# Patient Record
Sex: Female | Born: 1965 | Race: White | Hispanic: No | Marital: Married | Smoking: Current every day smoker
Health system: Southern US, Community
[De-identification: ages and names within clinical notes are randomized; demographics above are authoritative.]

## PROBLEM LIST (undated history)

## (undated) ENCOUNTER — Ambulatory Visit: Admission: RE | Discharge status: Absent without leave | Payer: Self-pay | Source: Ambulatory Visit

## (undated) DIAGNOSIS — K589 Irritable bowel syndrome without diarrhea: Secondary | ICD-10-CM

## (undated) DIAGNOSIS — G629 Polyneuropathy, unspecified: Secondary | ICD-10-CM

## (undated) DIAGNOSIS — F329 Major depressive disorder, single episode, unspecified: Secondary | ICD-10-CM

## (undated) DIAGNOSIS — E1142 Type 2 diabetes mellitus with diabetic polyneuropathy: Secondary | ICD-10-CM

## (undated) DIAGNOSIS — M5416 Radiculopathy, lumbar region: Secondary | ICD-10-CM

## (undated) DIAGNOSIS — S32301A Unspecified fracture of right ilium, initial encounter for closed fracture: Secondary | ICD-10-CM

## (undated) DIAGNOSIS — F32A Depression, unspecified: Secondary | ICD-10-CM

## (undated) DIAGNOSIS — F419 Anxiety disorder, unspecified: Secondary | ICD-10-CM

## (undated) DIAGNOSIS — K529 Noninfective gastroenteritis and colitis, unspecified: Secondary | ICD-10-CM

## (undated) DIAGNOSIS — L97529 Non-pressure chronic ulcer of other part of left foot with unspecified severity: Secondary | ICD-10-CM

## (undated) DIAGNOSIS — E119 Type 2 diabetes mellitus without complications: Secondary | ICD-10-CM

## (undated) DIAGNOSIS — K648 Other hemorrhoids: Secondary | ICD-10-CM

## (undated) DIAGNOSIS — R109 Unspecified abdominal pain: Secondary | ICD-10-CM

## (undated) DIAGNOSIS — M722 Plantar fascial fibromatosis: Secondary | ICD-10-CM

## (undated) DIAGNOSIS — Z87828 Personal history of other (healed) physical injury and trauma: Secondary | ICD-10-CM

## (undated) DIAGNOSIS — S2220XA Unspecified fracture of sternum, initial encounter for closed fracture: Secondary | ICD-10-CM

## (undated) DIAGNOSIS — S0300XA Dislocation of jaw, unspecified side, initial encounter: Secondary | ICD-10-CM

## (undated) DIAGNOSIS — D62 Acute posthemorrhagic anemia: Secondary | ICD-10-CM

## (undated) DIAGNOSIS — A599 Trichomoniasis, unspecified: Secondary | ICD-10-CM

## (undated) DIAGNOSIS — K219 Gastro-esophageal reflux disease without esophagitis: Secondary | ICD-10-CM

## (undated) DIAGNOSIS — L97519 Non-pressure chronic ulcer of other part of right foot with unspecified severity: Secondary | ICD-10-CM

## (undated) DIAGNOSIS — I10 Essential (primary) hypertension: Secondary | ICD-10-CM

## (undated) DIAGNOSIS — E785 Hyperlipidemia, unspecified: Secondary | ICD-10-CM

## (undated) HISTORY — DX: Other hemorrhoids: K64.8

## (undated) HISTORY — DX: Trichomoniasis, unspecified: A59.9

## (undated) HISTORY — DX: Unspecified abdominal pain: R10.9

## (undated) HISTORY — PX: TUBAL LIGATION: SHX77

## (undated) HISTORY — DX: Polyneuropathy, unspecified: G62.9

## (undated) HISTORY — DX: Gastro-esophageal reflux disease without esophagitis: K21.9

## (undated) HISTORY — DX: Dislocation of jaw, unspecified side, initial encounter: S03.00XA

## (undated) HISTORY — DX: Major depressive disorder, single episode, unspecified: F32.9

## (undated) HISTORY — DX: Hyperlipidemia, unspecified: E78.5

## (undated) HISTORY — DX: Depression, unspecified: F32.A

## (undated) HISTORY — DX: Non-pressure chronic ulcer of other part of left foot with unspecified severity: L97.529

## (undated) HISTORY — DX: Radiculopathy, lumbar region: M54.16

## (undated) HISTORY — DX: Non-pressure chronic ulcer of other part of right foot with unspecified severity: L97.519

## (undated) HISTORY — PX: OTHER SURGICAL HISTORY: SHX169

## (undated) HISTORY — DX: Noninfective gastroenteritis and colitis, unspecified: K52.9

## (undated) HISTORY — DX: Personal history of other (healed) physical injury and trauma: Z87.828

## (undated) HISTORY — DX: Anxiety disorder, unspecified: F41.9

## (undated) HISTORY — DX: Type 2 diabetes mellitus with diabetic polyneuropathy: E11.42

---

## 1988-08-24 HISTORY — PX: PELVIC EXENTERATION: SHX738

## 2000-01-20 ENCOUNTER — Encounter: Payer: Self-pay | Admitting: Orthopedic Surgery

## 2000-01-20 ENCOUNTER — Encounter: Admission: RE | Admit: 2000-01-20 | Discharge: 2000-01-20 | Payer: Self-pay | Admitting: Orthopedic Surgery

## 2000-01-28 ENCOUNTER — Encounter: Payer: Self-pay | Admitting: Orthopedic Surgery

## 2000-01-28 ENCOUNTER — Encounter: Admission: RE | Admit: 2000-01-28 | Discharge: 2000-01-28 | Payer: Self-pay | Admitting: Orthopedic Surgery

## 2000-02-19 ENCOUNTER — Emergency Department (HOSPITAL_COMMUNITY): Admission: EM | Admit: 2000-02-19 | Discharge: 2000-02-19 | Payer: Self-pay | Admitting: Emergency Medicine

## 2001-07-13 ENCOUNTER — Encounter: Payer: Self-pay | Admitting: Family Medicine

## 2001-07-13 ENCOUNTER — Ambulatory Visit (HOSPITAL_COMMUNITY): Admission: RE | Admit: 2001-07-13 | Discharge: 2001-07-13 | Payer: Self-pay | Admitting: Family Medicine

## 2002-11-02 ENCOUNTER — Emergency Department (HOSPITAL_COMMUNITY): Admission: EM | Admit: 2002-11-02 | Discharge: 2002-11-02 | Payer: Self-pay | Admitting: Emergency Medicine

## 2002-11-02 ENCOUNTER — Encounter: Payer: Self-pay | Admitting: Emergency Medicine

## 2002-11-03 ENCOUNTER — Emergency Department (HOSPITAL_COMMUNITY): Admission: EM | Admit: 2002-11-03 | Discharge: 2002-11-03 | Payer: Self-pay | Admitting: Emergency Medicine

## 2002-11-03 ENCOUNTER — Encounter: Payer: Self-pay | Admitting: Emergency Medicine

## 2004-01-03 ENCOUNTER — Encounter (INDEPENDENT_AMBULATORY_CARE_PROVIDER_SITE_OTHER): Payer: Self-pay | Admitting: *Deleted

## 2004-01-03 ENCOUNTER — Ambulatory Visit (HOSPITAL_COMMUNITY): Admission: RE | Admit: 2004-01-03 | Discharge: 2004-01-03 | Payer: Self-pay | Admitting: Obstetrics and Gynecology

## 2004-01-03 ENCOUNTER — Ambulatory Visit (HOSPITAL_BASED_OUTPATIENT_CLINIC_OR_DEPARTMENT_OTHER): Admission: RE | Admit: 2004-01-03 | Discharge: 2004-01-03 | Payer: Self-pay | Admitting: Obstetrics and Gynecology

## 2005-04-09 ENCOUNTER — Inpatient Hospital Stay (HOSPITAL_COMMUNITY): Admission: RE | Admit: 2005-04-09 | Discharge: 2005-04-12 | Payer: Self-pay | Admitting: Psychiatry

## 2005-04-09 ENCOUNTER — Ambulatory Visit: Payer: Self-pay | Admitting: Psychiatry

## 2005-04-22 ENCOUNTER — Emergency Department (HOSPITAL_COMMUNITY): Admission: EM | Admit: 2005-04-22 | Discharge: 2005-04-23 | Payer: Self-pay | Admitting: Emergency Medicine

## 2005-12-21 ENCOUNTER — Emergency Department (HOSPITAL_COMMUNITY): Admission: EM | Admit: 2005-12-21 | Discharge: 2005-12-22 | Payer: Self-pay | Admitting: Emergency Medicine

## 2007-11-07 ENCOUNTER — Emergency Department: Payer: Self-pay | Admitting: Emergency Medicine

## 2008-04-14 ENCOUNTER — Emergency Department: Payer: Self-pay | Admitting: Emergency Medicine

## 2008-08-22 ENCOUNTER — Emergency Department (HOSPITAL_COMMUNITY): Admission: EM | Admit: 2008-08-22 | Discharge: 2008-08-22 | Payer: Self-pay | Admitting: Family Medicine

## 2008-09-19 LAB — HM PAP SMEAR

## 2009-05-02 ENCOUNTER — Ambulatory Visit: Payer: Self-pay | Admitting: Family Medicine

## 2009-05-02 DIAGNOSIS — F1010 Alcohol abuse, uncomplicated: Secondary | ICD-10-CM | POA: Insufficient documentation

## 2009-05-02 DIAGNOSIS — F17200 Nicotine dependence, unspecified, uncomplicated: Secondary | ICD-10-CM

## 2009-05-02 DIAGNOSIS — I10 Essential (primary) hypertension: Secondary | ICD-10-CM

## 2009-05-02 DIAGNOSIS — F341 Dysthymic disorder: Secondary | ICD-10-CM

## 2009-05-02 DIAGNOSIS — K219 Gastro-esophageal reflux disease without esophagitis: Secondary | ICD-10-CM

## 2009-05-02 DIAGNOSIS — N946 Dysmenorrhea, unspecified: Secondary | ICD-10-CM

## 2009-05-02 DIAGNOSIS — E781 Pure hyperglyceridemia: Secondary | ICD-10-CM | POA: Insufficient documentation

## 2009-05-31 ENCOUNTER — Encounter: Payer: Self-pay | Admitting: Family Medicine

## 2009-05-31 ENCOUNTER — Ambulatory Visit: Payer: Self-pay | Admitting: Family Medicine

## 2009-07-19 ENCOUNTER — Emergency Department (HOSPITAL_COMMUNITY): Admission: EM | Admit: 2009-07-19 | Discharge: 2009-07-19 | Payer: Self-pay | Admitting: Emergency Medicine

## 2009-07-23 ENCOUNTER — Ambulatory Visit: Payer: Self-pay | Admitting: Family Medicine

## 2009-07-30 ENCOUNTER — Telehealth: Payer: Self-pay | Admitting: *Deleted

## 2009-08-01 ENCOUNTER — Telehealth (INDEPENDENT_AMBULATORY_CARE_PROVIDER_SITE_OTHER): Payer: Self-pay | Admitting: *Deleted

## 2009-08-08 ENCOUNTER — Ambulatory Visit: Payer: Self-pay | Admitting: Family Medicine

## 2009-08-08 ENCOUNTER — Encounter: Payer: Self-pay | Admitting: Family Medicine

## 2009-08-08 LAB — CONVERTED CEMR LAB
ALT: 14 units/L (ref 0–35)
BUN: 9 mg/dL (ref 6–23)
Basophils Relative: 0 % (ref 0–1)
CO2: 21 meq/L (ref 19–32)
Cholesterol: 230 mg/dL — ABNORMAL HIGH (ref 0–200)
Creatinine, Ser: 0.73 mg/dL (ref 0.40–1.20)
Eosinophils Absolute: 0.1 10*3/uL (ref 0.0–0.7)
Eosinophils Relative: 1 % (ref 0–5)
Glucose, Bld: 93 mg/dL (ref 70–99)
HDL: 44 mg/dL (ref >39)
MCHC: 33 g/dL (ref 30.0–36.0)
MCV: 99 fL (ref 78.0–100.0)
Monocytes Relative: 8 % (ref 3–12)
Neutrophils Relative %: 62 % (ref 43–77)
RBC: 3.92 M/uL (ref 3.87–5.11)
Total Bilirubin: 0.3 mg/dL (ref 0.3–1.2)
Total CHOL/HDL Ratio: 5.2
Triglycerides: 388 mg/dL — ABNORMAL HIGH (ref <150)
VLDL: 78 mg/dL — ABNORMAL HIGH (ref 0–40)

## 2009-08-20 ENCOUNTER — Encounter: Payer: Self-pay | Admitting: Family Medicine

## 2009-09-04 ENCOUNTER — Ambulatory Visit: Payer: Self-pay | Admitting: Family Medicine

## 2009-09-11 ENCOUNTER — Telehealth: Payer: Self-pay | Admitting: *Deleted

## 2009-10-09 ENCOUNTER — Ambulatory Visit: Payer: Self-pay | Admitting: Family Medicine

## 2009-10-09 DIAGNOSIS — G43909 Migraine, unspecified, not intractable, without status migrainosus: Secondary | ICD-10-CM

## 2009-10-21 ENCOUNTER — Ambulatory Visit: Payer: Self-pay | Admitting: Internal Medicine

## 2009-10-21 DIAGNOSIS — K625 Hemorrhage of anus and rectum: Secondary | ICD-10-CM

## 2009-10-22 ENCOUNTER — Ambulatory Visit: Payer: Self-pay | Admitting: Cardiovascular Disease

## 2009-10-24 ENCOUNTER — Telehealth: Payer: Self-pay | Admitting: Internal Medicine

## 2009-11-05 ENCOUNTER — Telehealth: Payer: Self-pay | Admitting: Family Medicine

## 2009-11-11 ENCOUNTER — Telehealth: Payer: Self-pay | Admitting: Family Medicine

## 2009-11-13 ENCOUNTER — Encounter: Payer: Self-pay | Admitting: Family Medicine

## 2009-11-13 ENCOUNTER — Telehealth: Payer: Self-pay | Admitting: Family Medicine

## 2009-11-13 ENCOUNTER — Ambulatory Visit: Payer: Self-pay | Admitting: Internal Medicine

## 2009-11-13 ENCOUNTER — Ambulatory Visit (HOSPITAL_COMMUNITY): Admission: RE | Admit: 2009-11-13 | Discharge: 2009-11-13 | Payer: Self-pay | Admitting: Internal Medicine

## 2009-11-13 LAB — HM COLONOSCOPY

## 2009-11-25 ENCOUNTER — Ambulatory Visit: Payer: Self-pay | Admitting: Family Medicine

## 2009-11-25 DIAGNOSIS — J309 Allergic rhinitis, unspecified: Secondary | ICD-10-CM | POA: Insufficient documentation

## 2009-12-02 ENCOUNTER — Telehealth: Payer: Self-pay | Admitting: Family Medicine

## 2009-12-24 ENCOUNTER — Telehealth: Payer: Self-pay | Admitting: Family Medicine

## 2010-01-13 ENCOUNTER — Ambulatory Visit: Payer: Self-pay | Admitting: Family Medicine

## 2010-01-30 ENCOUNTER — Encounter: Payer: Self-pay | Admitting: Family Medicine

## 2010-02-06 ENCOUNTER — Ambulatory Visit: Payer: Self-pay | Admitting: Family Medicine

## 2010-02-06 ENCOUNTER — Encounter: Payer: Self-pay | Admitting: Family Medicine

## 2010-02-06 DIAGNOSIS — R609 Edema, unspecified: Secondary | ICD-10-CM | POA: Insufficient documentation

## 2010-02-06 DIAGNOSIS — R252 Cramp and spasm: Secondary | ICD-10-CM | POA: Insufficient documentation

## 2010-02-06 LAB — CONVERTED CEMR LAB
Alkaline Phosphatase: 68 units/L (ref 39–117)
BUN: 13 mg/dL (ref 6–23)
CO2: 24 meq/L (ref 19–32)
Glucose, Bld: 95 mg/dL (ref 70–99)
HCT: 40.8 % (ref 36.0–46.0)
HCV Ab: NEGATIVE
Hemoglobin: 13.1 g/dL (ref 12.0–15.0)
MCHC: 32.1 g/dL (ref 30.0–36.0)
MCV: 101.7 fL — ABNORMAL HIGH (ref 78.0–100.0)
Magnesium: 2.2 mg/dL (ref 1.5–2.5)
Phosphorus: 2.8 mg/dL (ref 2.3–4.6)
RBC: 4.01 M/uL (ref 3.87–5.11)
TSH: 0.68 microintl units/mL (ref 0.350–4.500)
Total Bilirubin: 0.3 mg/dL (ref 0.3–1.2)
WBC: 9.1 10*3/uL (ref 4.0–10.5)

## 2010-02-12 ENCOUNTER — Encounter: Payer: Self-pay | Admitting: Family Medicine

## 2010-02-12 LAB — CONVERTED CEMR LAB: Pap Smear: NORMAL

## 2010-03-21 ENCOUNTER — Encounter: Payer: Self-pay | Admitting: Family Medicine

## 2010-03-21 ENCOUNTER — Ambulatory Visit: Payer: Self-pay | Admitting: Family Medicine

## 2010-03-21 DIAGNOSIS — K589 Irritable bowel syndrome without diarrhea: Secondary | ICD-10-CM

## 2010-03-21 LAB — CONVERTED CEMR LAB
Eosinophils Absolute: 0.1 10*3/uL (ref 0.0–0.7)
Eosinophils Relative: 1 % (ref 0–5)
HCT: 39.3 % (ref 36.0–46.0)
Hemoglobin: 13.2 g/dL (ref 12.0–15.0)
Lymphocytes Relative: 22 % (ref 12–46)
Lymphs Abs: 1.8 10*3/uL (ref 0.7–4.0)
MCV: 98 fL (ref 78.0–100.0)
Monocytes Absolute: 0.5 10*3/uL (ref 0.1–1.0)
Platelets: 282 10*3/uL (ref 150–400)
RDW: 12.1 % (ref 11.5–15.5)
WBC: 8.3 10*3/uL (ref 4.0–10.5)

## 2010-03-25 ENCOUNTER — Encounter: Payer: Self-pay | Admitting: Family Medicine

## 2010-03-28 ENCOUNTER — Telehealth: Payer: Self-pay | Admitting: Family Medicine

## 2010-04-01 ENCOUNTER — Ambulatory Visit: Payer: Self-pay | Admitting: Family Medicine

## 2010-04-04 ENCOUNTER — Telehealth: Payer: Self-pay | Admitting: *Deleted

## 2010-04-29 ENCOUNTER — Ambulatory Visit: Payer: Self-pay | Admitting: Family Medicine

## 2010-04-29 DIAGNOSIS — J069 Acute upper respiratory infection, unspecified: Secondary | ICD-10-CM | POA: Insufficient documentation

## 2010-05-12 ENCOUNTER — Telehealth: Payer: Self-pay | Admitting: Family Medicine

## 2010-05-23 ENCOUNTER — Telehealth (INDEPENDENT_AMBULATORY_CARE_PROVIDER_SITE_OTHER): Payer: Self-pay | Admitting: *Deleted

## 2010-07-01 ENCOUNTER — Ambulatory Visit: Payer: Self-pay | Admitting: Family Medicine

## 2010-07-15 ENCOUNTER — Telehealth: Payer: Self-pay | Admitting: Family Medicine

## 2010-08-05 ENCOUNTER — Encounter: Payer: Self-pay | Admitting: Family Medicine

## 2010-08-05 ENCOUNTER — Ambulatory Visit: Payer: Self-pay | Admitting: Family Medicine

## 2010-08-05 DIAGNOSIS — R1084 Generalized abdominal pain: Secondary | ICD-10-CM | POA: Insufficient documentation

## 2010-08-05 LAB — CONVERTED CEMR LAB
ALT: 22 units/L (ref 0–35)
AST: 21 units/L (ref 0–37)
Albumin: 4.4 g/dL (ref 3.5–5.2)
Alkaline Phosphatase: 71 units/L (ref 39–117)
BUN: 11 mg/dL (ref 6–23)
Basophils Absolute: 0 10*3/uL (ref 0.0–0.1)
Basophils Relative: 0 % (ref 0–1)
CO2: 24 meq/L (ref 19–32)
Calcium: 9.6 mg/dL (ref 8.4–10.5)
Chloride: 106 meq/L (ref 96–112)
Creatinine, Ser: 0.64 mg/dL (ref 0.40–1.20)
Eosinophils Absolute: 0.1 10*3/uL (ref 0.0–0.7)
Eosinophils Relative: 2 % (ref 0–5)
Glucose, Bld: 112 mg/dL — ABNORMAL HIGH (ref 70–99)
HCT: 40 % (ref 36.0–46.0)
Hemoglobin: 13.1 g/dL (ref 12.0–15.0)
Lymphocytes Relative: 21 % (ref 12–46)
Lymphs Abs: 1.8 10*3/uL (ref 0.7–4.0)
MCHC: 32.8 g/dL (ref 30.0–36.0)
MCV: 100.5 fL — ABNORMAL HIGH (ref 78.0–100.0)
Monocytes Absolute: 0.4 10*3/uL (ref 0.1–1.0)
Monocytes Relative: 5 % (ref 3–12)
Neutro Abs: 6.3 10*3/uL (ref 1.7–7.7)
Neutrophils Relative %: 73 % (ref 43–77)
Platelets: 324 10*3/uL (ref 150–400)
Potassium: 4.6 meq/L (ref 3.5–5.3)
RBC: 3.98 M/uL (ref 3.87–5.11)
RDW: 12.2 % (ref 11.5–15.5)
Sodium: 139 meq/L (ref 135–145)
Total Bilirubin: 0.3 mg/dL (ref 0.3–1.2)
Total Protein: 7.4 g/dL (ref 6.0–8.3)
WBC: 8.6 10*3/uL (ref 4.0–10.5)

## 2010-08-06 ENCOUNTER — Ambulatory Visit: Payer: Self-pay | Admitting: Family Medicine

## 2010-09-25 NOTE — Miscellaneous (Signed)
Summary: patient summary  Clinical Lists Changes  Problems: Removed problem of INSOMNIA, TRANSIENT (ICD-780.52) Removed problem of DYSPNEA (ICD-786.05) Changed problem from RECTAL BLEEDING (ICD-569.3) to History of  RECTAL BLEEDING (ICD-569.3) Removed problem of ABDOMINAL PAIN -GENERALIZED (ICD-789.07) Removed problem of LEG EDEMA (ICD-782.3) -  and hand edema Observations: Added new observation of SOCIAL HX: has 4 brothers (3 living) and 1 sister.  lives with Uvaldo Rising (her ex husband but they are back together).  1 son Barbara Cower - born in [birth year]). + alcohol, + tobacco, no drugs. looking for work.  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 originally from [place].  Alcohol Use - yes Daily Caffeine Use  (01/30/2010 16:11) Added new observation of PAST MED HX: history of cervical cancer  at age 19 - "frozen off".  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  lives with Uvaldo Rising (her ex husband but they are back together).  1 son Barbara Cower - born in [birth year]). + alcohol, + tobacco, no drugs. looking for work.  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 originally from [place].  Alcohol Use - yes Daily Caffeine Use

## 2010-09-25 NOTE — Progress Notes (Signed)
Summary: cxl  Phone Note Call from Patient Call back at Home Phone [phone]   Caller: Patient Summary of Call: cannot come today due to having flu like symptoms Initial call taken by: De Nurse,  December 02, 2009 8:44 AM

## 2010-09-25 NOTE — Progress Notes (Signed)
Summary: Rx Req  Phone Note Refill Request Call back at Home Phone [phone] Message from:  Patient  Refills Requested: Medication #1:  KLONOPIN 1 MG TABS 1 by mouth two times a day as needed anxiety. Do not fill until 07/31/2009 Please call when ready to pick up.  Initial call taken by: Clydell Hakim,  November 05, 2009 12:05 PM  Follow-up for Phone Call        at front desk Follow-up by: Ancil Boozer  MD,  November 08, 2009 9:47 AM    New/Updated Medications: KLONOPIN 1 MG TABS (CLONAZEPAM) 1 by mouth two times a day as needed anxiety. Do not fill until 11/06/2009.  last fill before visit with MD Prescriptions: KLONOPIN 1 MG TABS (CLONAZEPAM) 1 by mouth two times a day as needed anxiety. Do not fill until 11/06/2009.  last fill before visit with MD  #60 x 0   Entered and Authorized by:   Ancil Boozer  MD   Signed by:   Ancil Boozer  MD on 11/08/2009   Method used:   Handwritten   RxID:   [id number]

## 2010-09-25 NOTE — Letter (Signed)
Summary: Generic Letter  [facility]  [address]   [place], [place] [address]   Phone: [phone]  Fax: [phone]    02/12/2010  NOVIS LEAGUE [address] [place], [place]  [address]  Dear Ms. BARBOUR-SWAIN,   Your recent pap smear was normal.  Your next is due in 1 year.  Your other lab tests were also all normal.  If you have questions feel free to call the family practice center.     Sincerely,   Ancil Boozer  MD  Appended Document: Generic Letter mailed

## 2010-09-25 NOTE — Assessment & Plan Note (Signed)
Summary: f/u,df(resch'd from 1/7/meds refill pt of alm)bmc   Vital Signs:  Patient profile:   45 year old female Height:      68.25 inches Weight:      171 pounds BMI:     25.90 BSA:     1.92 Temp:     97.9 degrees F Pulse rate:   108 / minute BP sitting:   133 / 91  Vitals Entered By: Jone Baseman CMA (September 04, 2009 9:11 AM) CC: MED REFILLS Is Patient Diabetic? No Pain Assessment Patient in pain? yes     Location: STOMACH Intensity: 7   Primary Care Provider:  Ancil Boozer  MD  CC:  MED REFILLS.  History of Present Illness: 1. Abd pain -  Has been going on since November. Pain is in mid-lower abdomen and intermittent. Not worse with movement or food. Not related to menses. Seen in ED November and CT scan showed splenic flexure diverticulitis. Given a course of cipro and flagyl which helped slightly but she hasn't fully recovered. Started on percocet in Nov by Dr. Sandi Mealy -- taking 2 to 4 of these per day which help. Hasn't obtained the stool studies that Dr. Sandi Mealy ordered. Referred to GI but has had trouble getting in with [facility] (has [facility]). Stools variably well-formed and loose. Pain not consistently related to nausea (see below) No blood in stool. CBC, CMP normal at last visit. Not taking tramadol as this doesn't help her pain.  2. Nausea/vomiting Has persistent burning "acid" in her stomach with associated nausea. Will occasionally force herself to vomit to improve the pain. Sometimes worse with eating, sometimes not associated with eating. Ranitidine doesn't seem to help. Phenergan very helpful for the nausea. Hasn't tried a PPI that she knows of. Milk, tums, pepto-bismol help the nausea.  no bloody emesis   3. Anxiety Stable. Doing well on klonipin.  ROS: no fevers/chills. No joint aches or rash. No problems breathing. + decreased appetite. + abdominal bloating.   Habits & Providers  Alcohol-Tobacco-Diet     Tobacco Status: current     Tobacco Counseling:  to quit use of tobacco products     Cigarette Packs/Day: 1.0  Current Medications (verified): 1)  Klonopin 1 Mg Tabs (Clonazepam) .Marland Kitchen.. 1 By Mouth Two Times A Day As Needed Anxiety 2)  Hydrocodone-Acetaminophen 5-325 Mg Tabs (Hydrocodone-Acetaminophen) .Marland Kitchen.. 1-2 Tablets By Mouth Every 6 Hours As Needed For Pain 3)  Deep Sea Nasal Spray 0.65 % Soln (Saline) 4)  Prilosec 40 Mg Cpdr (Omeprazole) .... One By Mouth Daily For Reflux 5)  Klonopin 1 Mg Tabs (Clonazepam) .Marland Kitchen.. 1 By Mouth Two Times A Day As Needed Anxiety.  Do Not Fill Until 07/01/2009 6)  Klonopin 1 Mg Tabs (Clonazepam) .Marland Kitchen.. 1 By Mouth Two Times A Day As Needed Anxiety. Do Not Fill Until 07/31/2009 7)  Promethazine Hcl 25 Mg Tabs (Promethazine Hcl) .... 1/2 - 1 By Mouth Three Times A Day As Needed Nausea.  Will Cause Drowsiness  Allergies (verified): 1)  Erythrocin Stearate (Erythromycin Stearate)  Past History:  Past Medical History: history of cervical cancer  at age 49 - frozen off.  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  Physical Exam  Additional Exam:  General:  Vital signs reviewed -- obese but otherwise normal Alert, appropriate; well-dressed and well-nourished Lungs:  work of breathing unlabored, clear to auscultation bilaterally; no wheezes, rales, or ronchi; good air  movement throughout Heart:  regular rate and rhythm, no murmurs; normal s1/s2 Abd: +BS, soft, periumbilical TTP with voluntary guarding; no rebound. No ascites. No distension. No mass.  Pulses:  DP and radial pulses 2+ bilaterally  Extremities:  no cyanosis, clubbing, or edema Neurologic:  alert and oriented. speech normal.    Impression & Recommendations:  Problem # 1:  ABDOMINAL PAIN, PERIUMBILIC (ICD-789.05)  Colitis diagnosed in Nov. [facility] evaluation.  Discussed that percocet was not the best medicine for this kind of pain. Will  try hydrocodone instead to see how this does. Will refer again to GI -- hopefully [facility] will be able to help [place]. Tramadol removed from med list. Consider anothe course of abx with probiotics.   Orders: [facility]- Est  Level 4 ([address])  Problem # 2:  GASTROESOPHAGEAL REFLUX DISEASE (ICD-530.81) Advance to PPI. Continue phenergain.  Her updated medication list for this problem includes:    Prilosec 40 Mg Cpdr (Omeprazole) ..... One by mouth daily for reflux  Orders: Gastroenterology Referral (GI) [facility]- Est  Level 4 ([address])  Problem # 3:  ANXIETY DEPRESSION (ICD-300.4)  refill klonipin.  Orders: [facility]- Est  Level 4 ([address])  Complete Medication List: 1)  Klonopin 1 Mg Tabs (Clonazepam) .Marland Kitchen.. 1 by mouth two times a day as needed anxiety 2)  Hydrocodone-acetaminophen 5-325 Mg Tabs (Hydrocodone-acetaminophen) .Marland Kitchen.. 1-2 tablets by mouth every 6 hours as needed for pain 3)  Deep Sea Nasal Spray 0.65 % Soln (Saline) 4)  Prilosec 40 Mg Cpdr (Omeprazole) .... One by mouth daily for reflux 5)  Klonopin 1 Mg Tabs (Clonazepam) .Marland Kitchen.. 1 by mouth two times a day as needed anxiety.  do not fill until 07/01/2009 6)  Klonopin 1 Mg Tabs (Clonazepam) .Marland Kitchen.. 1 by mouth two times a day as needed anxiety. do not fill until 07/31/2009 7)  Promethazine Hcl 25 Mg Tabs (Promethazine hcl) .... 1/2 - 1 by mouth three times a day as needed nausea.  will cause drowsiness  Patient Instructions: 1)  Start the prilosec to replace the ranitidine. 2)  Please bring in the stool studies. 3)  stop the tramadol. 4)  We'll work to get you an appointment with [facility].  5)  Try the vicodin for the pain. Let me know how this does for you. 6)  follow-up in one month. Prescriptions: PROMETHAZINE HCL 25 MG TABS (PROMETHAZINE HCL) 1/2 - 1 by mouth three times a day as needed nausea.  will cause drowsiness  #30 x 1   Entered and Authorized by:   Myrtie Soman  MD   Signed by:   Myrtie Soman  MD on 09/04/2009   Method used:   Print  then Give to Patient   RxID:   [id number] KLONOPIN 1 MG TABS (CLONAZEPAM) 1 by mouth two times a day as needed anxiety  #60 x 0   Entered and Authorized by:   Myrtie Soman  MD   Signed by:   Myrtie Soman  MD on 09/04/2009   Method used:   Print then Give to Patient   RxID:   [id number] HYDROCODONE-ACETAMINOPHEN 5-325 MG TABS (HYDROCODONE-ACETAMINOPHEN) 1-2 tablets by mouth every 6 hours as needed for pain  #60 x 0   Entered and Authorized by:   Myrtie Soman  MD   Signed by:   Myrtie Soman  MD on 09/04/2009   Method used:   Print then Give to Patient   RxID:   [id number] PRILOSEC 40 MG CPDR (OMEPRAZOLE) one by mouth daily  for reflux  #30 x 2   Entered and Authorized by:   Myrtie Soman  MD   Signed by:   Myrtie Soman  MD on 09/04/2009   Method used:   Electronically to        [facility]* (retail)       [address]       [place], [place]  [address]       Ph: [phone]       Fax: [phone]   RxID:   [id number]

## 2010-09-25 NOTE — Assessment & Plan Note (Signed)
Summary: f/u meds,df   Vital Signs:  Patient profile:   45 year old female Height:      68.5 inches Weight:      175.2 pounds BMI:     26.35 Temp:     98.0 degrees F oral Pulse rate:   103 / minute BP sitting:   148 / 107  (right arm) Cuff size:   regular  Vitals Entered By: Garen Grams LPN (November 25, 452 2:10 PM)  Serial Vital Signs/Assessments:  Time      Position  BP       Pulse  Resp  Temp     By                     140/86                         Ancil Boozer  MD  CC: f/u blood pressure, abdominal pain, ? allergies, extremities swelling Is Patient Diabetic? No   Primary Care Provider:  Ancil Boozer  MD  CC:  f/u blood pressure, abdominal pain, ? allergies, and extremities swelling.  History of Present Illness: BP: hasn't been checking regularly but has noticed it is up some at her other doctors visits.  has been trying to watch the amount of salt in her diet however does notice that randomly her feet and hands will swell.  denies chest pains  abdominal pain: continues partiucularly with menses.  has been using excedrin migrain for headaches, goodys and bc powders for menstral pains.  has had some improvement with zantac 150 two times a day and phenergan prn.  wonders if this could be the result of a "female issue."  wonders if a hysterectomy would help.  is due for pap and wants to schedule this.   still having diarrhea without blood and denies having vomiting any more.  ? allergies: for past few weeks has noticed productive cough, runny nose and sneezing.  denies itchy eyes.  OTC loratadine has helped some but she often forgets to take it.  denies fevers  swelling: occurs randomly.  worst in feet at night, worst in hands when wakes up in AM.  no significant pain with the swelling. no erythema or numbness with swelling  Habits & Providers  Alcohol-Tobacco-Diet     Tobacco Status: current     Tobacco Counseling: to quit use of tobacco products  Current Medications  (verified): 1)  Zantac 150 Mg Tabs (Ranitidine Hcl) .... Two Times A Day For Reflux 2)  Klonopin 1 Mg Tabs (Clonazepam) .Marland Kitchen.. 1 By Mouth Two Times A Day As Needed Anxiety. Do Not Fill Until 11/06/2009.  Last Fill Before Visit With Md 3)  Promethazine Hcl 25 Mg Tabs (Promethazine Hcl) .... 1/2 - 1 By Mouth Every 4-6 Hours.  Will Cause Drowsiness 4)  Hydrochlorothiazide 12.5 Mg Caps (Hydrochlorothiazide) .Marland Kitchen.. 1 By Mouth Once Daily For Blood Pressure 5)  Fluticasone Propionate 50 Mcg/act Susp (Fluticasone Propionate) .... 2 Sprays Each Nostril Daily 6)  Hydrocodone-Acetaminophen 5-500 Mg Tabs (Hydrocodone-Acetaminophen) .Marland Kitchen.. 1 By Mouth Two Times A Day As Needed Pain On Menstrual Cycle  Allergies (verified): 1)  Erythrocin Stearate  Past History:  Past medical, surgical, family and social histories (including risk factors) reviewed for relevance to current acute and chronic problems.  Past Medical History: Reviewed history from 10/21/2009 and no changes required. history of cervical cancer  at age 63 - frozen off.  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 C section x 1 (1988) BTL  Family History: Reviewed history from 05/02/2009 and no changes required. Mom - heart disease, diabetes, dementia, emphysema (deceased) Dad - massive MI at age 29, heavy drinker (deceased) Brother - 3 strokes  Brother - deceased ([place] in [place] - emphysema) 2 brothers - heavy drinkers Sister - bipolar, ADHD  Social History: Reviewed history from 10/21/2009 and no changes required. has 4 brothers (3 living) and 1 sister.  lives with Uvaldo Rising.  1 son Barbara Cower - born in 22). + alcohol, + tobacco, no drugs. looking for work.  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 originally from [place].  Alcohol Use - yes Daily Caffeine Use  Review of Systems       per HPI  Physical Exam  General:  Well-developed,well-nourished,in no acute distress; alert,appropriate and cooperative throughout examination.  VS noted Lungs:  Normal respiratory effort, chest expands symmetrically. Lungs are clear to auscultation, no crackles or wheezes. Heart:  Normal rate and regular rhythm. S1 and S2 normal without gallop, murmur, click, rub or other extra sounds. Abdomen:  Bowel sounds positive,abdomen soft and non-tender without masses, organomegaly or hernias noted. Extremities:  no edema noted today   Impression & Recommendations:  Problem # 1:  ESSENTIAL HYPERTENSION, BENIGN (ICD-401.1) Assessment New  start HCTZ.  when comes back for pap check Cmet.   Her updated medication list for this problem includes:    Hydrochlorothiazide 12.5 Mg Caps (Hydrochlorothiazide) .Marland Kitchen... 1 by mouth once daily for blood pressure  Orders: [facility]- Est  Level 4 ([address])  Problem # 2:  ABDOMINAL PAIN -GENERALIZED (ICD-789.07) Assessment: Unchanged  likely multifactorial.  due for pelvic workup - have asked her to schedule pap and can discuss female issues in further depth at that time.  would like to decrease phenergan, stop vicodin for menstrual pains.   Orders: [facility]- Est  Level 4 ([address])  Problem # 3:  ALLERGIC RHINITIS (ICD-477.9) Assessment: New  trial of fluticasone continue otc loratadine  The following medications were removed from the medication list:    Deep Sea Nasal Spray 0.65 % Soln (Saline) Her updated medication list for this problem includes:    Promethazine Hcl 25 Mg Tabs (Promethazine hcl) .Marland Kitchen... 1/2 - 1 by mouth every 4-6 hours.  will cause drowsiness    Fluticasone Propionate 50 Mcg/act Susp (Fluticasone propionate) .Marland Kitchen... 2 sprays each nostril daily  Orders: [facility]- Est  Level 4 ([address])  Problem # 4:  LEG EDEMA (ICD-782.3) Assessment:  New  unclear etiology at this point. suspect at least part of it is too much salt intake. another part may be hormonal. monitor for improvement with hctz.   Her updated medication list for this problem includes:    Hydrochlorothiazide 12.5 Mg Caps (Hydrochlorothiazide) .Marland Kitchen... 1 by mouth once daily for blood pressure  Orders: [facility]- Est  Level 4 ([address])  Complete Medication List: 1)  Zantac 150 Mg Tabs (Ranitidine hcl) .... Two times a day for reflux 2)  Klonopin 1 Mg Tabs (Clonazepam) .Marland Kitchen.. 1 by mouth two times a day as needed anxiety. do not fill until 11/06/2009.  last fill before visit with md 3)  Promethazine Hcl 25 Mg Tabs (Promethazine hcl) .... 1/2 - 1 by mouth every 4-6 hours.  will cause drowsiness 4)  Hydrochlorothiazide 12.5  Mg Caps (Hydrochlorothiazide) .Marland Kitchen.. 1 by mouth once daily for blood pressure 5)  Fluticasone Propionate 50 Mcg/act Susp (Fluticasone propionate) .... 2 sprays each nostril daily 6)  Hydrocodone-acetaminophen 5-500 Mg Tabs (Hydrocodone-acetaminophen) .Marland Kitchen.. 1 by mouth two times a day as needed pain on menstrual cycle  Patient Instructions: 1)  Please schedule an appointment for your pap smear.  2)  Take your medication only as prescribed.  Prescriptions: HYDROCHLOROTHIAZIDE 12.5 MG CAPS (HYDROCHLOROTHIAZIDE) 1 by mouth once daily for blood pressure  #30 x 6   Entered and Authorized by:   Ancil Boozer  MD   Signed by:   Ancil Boozer  MD on 11/25/2009   Method used:   Handwritten   RxID:   [id number] FLUTICASONE PROPIONATE 50 MCG/ACT SUSP (FLUTICASONE PROPIONATE) 2 sprays each nostril daily  #1 x 6   Entered and Authorized by:   Ancil Boozer  MD   Signed by:   Ancil Boozer  MD on 11/25/2009   Method used:   Handwritten   RxID:   [id number] KLONOPIN 1 MG TABS (CLONAZEPAM) 1 by mouth two times a day as needed anxiety. Do not fill until 11/06/2009.  last fill before visit with MD  #60 x 0   Entered and Authorized by:   Ancil Boozer  MD   Signed by:    Ancil Boozer  MD on 11/25/2009   Method used:   Handwritten   RxID:   [id number] HYDROCODONE-ACETAMINOPHEN 5-500 MG TABS (HYDROCODONE-ACETAMINOPHEN) 1 by mouth two times a day as needed pain on menstrual cycle  #14 x 0   Entered and Authorized by:   Ancil Boozer  MD   Signed by:   Ancil Boozer  MD on 11/25/2009   Method used:   Handwritten   RxID:   [id number]   Prevention & Chronic Care Immunizations   Influenza vaccine: Not documented    Tetanus booster: Not documented    Pneumococcal vaccine: Not documented  Other Screening   Pap smear: Not documented    Mammogram: Not documented   Smoking status: current  (11/25/2009)  Lipids   Total Cholesterol: 230  (08/08/2009)   LDL: 108  (08/08/2009)   LDL Direct: Not documented   HDL: 44  (08/08/2009)   Triglycerides: 388  (08/08/2009)    SGOT (AST): 17  (08/08/2009)   SGPT (ALT): 14  (08/08/2009)   Alkaline phosphatase: 62  (08/08/2009)   Total bilirubin: 0.3  (08/08/2009)    Lipid flowsheet reviewed?: Yes   Progress toward LDL goal: At goal  Hypertension   Last Blood Pressure: 148 / 107  (11/25/2009)   Serum creatinine: 0.73  (08/08/2009)   Serum potassium 4.8  (08/08/2009)    Hypertension flowsheet reviewed?: Yes   Progress toward BP goal: Unchanged   Hypertension comments: new dx today  Self-Management Support :   Personal Goals (by the next clinic visit) :      Personal blood pressure goal: 140/90  (11/25/2009)     Personal LDL goal: 130  (11/25/2009)    Hypertension self-management support: Not documented    Hypertension self-management support not done because: Good outcomes  (11/25/2009)    Lipid self-management support: Not documented     Lipid self-management support not done because: Good outcomes  (11/25/2009)    Self-management comments: good outcomes because willing to start med today for blood pressure

## 2010-09-25 NOTE — Procedures (Signed)
Summary: Colonoscopy  Patient: [name] Note: All result statuses are Final unless otherwise noted.  Tests: (1) Colonoscopy (COL)   COL Colonoscopy           DONE     [facility]     [address] [place], [place]  [address]           COLONOSCOPY PROCEDURE REPORT           PATIENT:  Denise Webb, Denise Webb  MR#:  [id number]     BIRTHDATE:  07-Jun-1966, 43 yrs. old  GENDER:  female           ENDOSCOPIST:  Wilhemina Bonito. Eda Keys, MD     Referred by:  [facility] (DR ALM, DR HENSEL),           PROCEDURE DATE:  11/13/2009     PROCEDURE:  Colonoscopy, Diagnostic     ASA CLASS:  Class II     INDICATIONS:  abdominal pain, diarrhea, rectal bleeding; abnormal     CT of abdomen (NOV 2011) - but normal CT 10-22-09           MEDICATIONS:   Fentanyl 150 mcg IV, Versed 15 mg IV, Benadryl 50     mg IV           DESCRIPTION OF PROCEDURE:   After the risks benefits and     alternatives of the procedure were thoroughly explained, informed     consent was obtained.  Digital rectal exam was performed and     revealed no abnormalities.   The Pentax Colonoscope [id number]     endoscope was introduced through the anus and advanced to the     cecum, which was identified by both the appendix and ileocecal     valve, without limitations.  The quality of the prep was     excellent, using MoviPrep. THE PATIENT WAS DIFFICULT TO ADEQUATELY     SEDATE. The instrument was then slowly withdrawn as the colon was     fully examined. PHOTOS TO DOCUMENT THE ILEUM, CECAL LANDMARKS, AND     RECTAL RETROFLEXTION WERE TAKEN BUT NOT CAPTURED DUE TO TECHNICAL     DIFFICULTIES.           FINDINGS:  A normal appearing cecum, ileocecal valve, and     appendiceal orifice were identified. The ascending, hepatic     flexure, transverse, splenic flexure, descending, sigmoid colon,     and rectum appeared unremarkable.  The terminal ileum appeared     normal.   Retroflexed views in the rectum  revealed internal     hemorrhoids.    The scope was then withdrawn from the patient and     the procedure completed.           COMPLICATIONS:  None           ENDOSCOPIC IMPRESSION:     1) Normal colon     2) Normal terminal ileum     3) Internal hemorrhoids           RECOMMENDATIONS:     1) NO GI CAUSE FOR PAIN FOUND OR SUSPECTED     2) Return to the care of your primary provider.     2) Continue current colorectal screening recommendations for     "routine risk" patients with a repeat colonoscopy in 10 years.           ______________________________  Wilhemina Bonito. Eda Keys, MD           CC:  [facility] (Dr. Ancil Boozer); The Patient                 n.     eSIGNED:   Wilhemina Bonito. Eda Keys at 11/13/2009 09:33 AM           Burney Gauze, [id number]  Note: An exclamation mark (!) indicates a result that was not dispersed into the flowsheet. Document Creation Date: 11/13/2009 9:33 AM _______________________________________________________________________  (1) Order result status: Final Collection or observation date-time: 11/13/2009 09:21 Requested date-time:  Receipt date-time:  Reported date-time:  Referring Physician:   Ordering Physician: Fransico Setters [phone]) Specimen Source:  Source: Launa Grill Order Number: [id number] Lab site:

## 2010-09-25 NOTE — Progress Notes (Signed)
Summary: refill  Phone Note Refill Request Call back at Home Phone [phone] Message from:  Patient  Refills Requested: Medication #1:  HYDROCHLOROTHIAZIDE 12.5 MG CAPS 1 by mouth once daily for blood pressure  Medication #2:  KLONOPIN 1 MG TABS 1 by mouth two times a day as needed anxiety. Do not fill until 11/06/2009.  last fill before visit with MD  Medication #3:  HYDROCODONE-ACETAMINOPHEN 5-500 MG TABS 1 by mouth two times a day as needed pain on menstrual cycle. Initial call taken by: De Nurse,  Dec 24, 2009 9:28 AM  Follow-up for Phone Call        scripts available at front desk. last fill before appt for workup of female issues Follow-up by: Ancil Boozer  MD,  Dec 25, 2009 8:45 AM  Additional Follow-up for Phone Call Additional follow up Details #1::        pt informed - will make appt when she comes to pick up meds Additional Follow-up by: De Nurse,  Dec 25, 2009 8:46 AM    Prescriptions: HYDROCODONE-ACETAMINOPHEN 5-500 MG TABS (HYDROCODONE-ACETAMINOPHEN) 1 by mouth two times a day as needed pain on menstrual cycle  #14 x 0   Entered and Authorized by:   Ancil Boozer  MD   Signed by:   Ancil Boozer  MD on 12/25/2009   Method used:   Handwritten   RxID:   [id number] HYDROCHLOROTHIAZIDE 12.5 MG CAPS (HYDROCHLOROTHIAZIDE) 1 by mouth once daily for blood pressure  #30 x 1   Entered and Authorized by:   Ancil Boozer  MD   Signed by:   Ancil Boozer  MD on 12/25/2009   Method used:   Handwritten   RxID:   [id number] KLONOPIN 1 MG TABS (CLONAZEPAM) 1 by mouth two times a day as needed anxiety. Do not fill until 11/06/2009.  last fill before visit with MD  #60 x 0   Entered and Authorized by:   Ancil Boozer  MD   Signed by:   Ancil Boozer  MD on 12/25/2009   Method used:   Handwritten   RxID:   [id number]

## 2010-09-25 NOTE — Procedures (Signed)
Summary: Instructions for procedure/[facility] (out pt)  Insturctions for procedure/[facility] (out pt)   Imported By: Sherian Rein 10/23/2009 08:47:07  _____________________________________________________________________  External Attachment:    Type:   Image     Comment:   External Document

## 2010-09-25 NOTE — Progress Notes (Signed)
Summary: Rx Req  Phone Note Call from Patient Call back at Home Phone [phone]   Caller: Patient Summary of Call: Pt asking for a rx for Tramadol.  Pharmacy is [facility]. Initial call taken by: Clydell Hakim,  May 12, 2010 2:16 PM    New/Updated Medications: TRAMADOL HCL 50 MG TABS (TRAMADOL HCL) one tab by mouth two times a day as needed for pain Prescriptions: TRAMADOL HCL 50 MG TABS (TRAMADOL HCL) one tab by mouth two times a day as needed for pain  #15 x 0   Entered and Authorized by:   Bobby Rumpf  MD   Signed by:   Bobby Rumpf  MD on 05/13/2010   Method used:   Electronically to        [facility]* (retail)       [address]       [place], [place]  [address]       Ph: [phone]       Fax: [phone]   RxID:   [id number]

## 2010-09-25 NOTE — Assessment & Plan Note (Signed)
Summary: swollen abd & pain,tcb   Vital Signs:  Patient profile:   45 year old female Height:      68.5 inches Weight:      169.2 pounds BMI:     25.44 Temp:     97.8 degrees F oral Pulse rate:   94 / minute BP sitting:   130 / 87  (left arm) Cuff size:   regular  Vitals Entered By: Garen Grams LPN (April 01, 2010 9:39 AM) CC: having abd pain, diarrhea, nausea Is Patient Diabetic? No   Primary Care Provider:  Bobby Rumpf  MD  CC:  having abd pain, diarrhea, and nausea.  History of Present Illness: CC: abdominal pain, vomiting and diarrhea  1) Chronic abdominal pain: Since June 2010 - diffuse abdominal pain w/ intermittent episodes of diarrhea. Treated for colitis in November 2010 with course of cipro and flagyl. Negative colonoscopy (except for internal hemorrhoids) / EGD in March 2011 w/ Dr. Marina Goodell.  Seen on 7/29 for complaints of diffuse crampy abdominal; pain, diarrhea (6-8 times per day). Also noted to have a single episode of small amount of painless BRBPR (recent colonoscopy did demonstrate internal hemorrhoids). Also with nausea w/o emesis. History of GERD treated with ranitidine (GERD worse with spicy foods, tomatoes).  LMP was this month (unsure when) - history of dysmenorrhea (treated unsuccessfully with Vicodin). Takes Goody powders, tylenol for abdominal pain without relief. Diarrhea somewhat relieved by imodium.   ROS: .  Negative for fever, chills, CP, SOB, constipation, lethargy, sick contact, travel, .  2) Tobacco use: Precontemplative.   See prior meds for med rec   Habits & Providers  Alcohol-Tobacco-Diet     Tobacco Status: current     Tobacco Counseling: to quit use of tobacco products  Medications Prior to Update: 1)  Zantac 150 Mg Tabs (Ranitidine Hcl) .... Two Times A Day For Reflux 2)  Klonopin 1 Mg Tabs (Clonazepam) .Marland Kitchen.. 1 By Mouth Two Times A Day As Needed Anxiety.do Not Do First Fill Until 02/24/10 Then May Fill Every 30 Days 3)  Promethazine Hcl 25  Mg Tabs (Promethazine Hcl) .... 1/2 - 1 By Mouth Every 4-6 Hours.  Will Cause Drowsiness 4)  Hydrochlorothiazide 12.5 Mg Caps (Hydrochlorothiazide) .Marland Kitchen.. 1 By Mouth Once Daily For Blood Pressure 5)  Fluticasone Propionate 50 Mcg/act Susp (Fluticasone Propionate) .... 2 Sprays Each Nostril Daily 6)  Hydrocodone-Acetaminophen 5-500 Mg Tabs (Hydrocodone-Acetaminophen) .Marland Kitchen.. 1 By Mouth Two Times A Day As Needed Pain On Menstrual Cycle.  May Fill Every 30 Days 7)  Lidocaine Viscous 2 % Soln (Lidocaine Hcl) .Marland Kitchen.. 10ml Swish and Spit Every 3 Hrs As Needed.  Disp 7 Day Supply  Allergies (verified): 1)  Erythrocin Stearate  Physical Exam  General:  no acute distress; alert,appropriate and cooperative throughout examination Lungs:  Normal respiratory effort, chest expands symmetrically. Lungs are clear to auscultation, no crackles or wheezes. Heart:  Normal rate and regular rhythm. S1 and S2 normal without gallop, murmur, click, rub or other extra sounds. Abdomen:  minimally distended, mimimally tender diffusely, hyperactive bowel sounds present in all 4 quadrants, no masses, no guarding, and no hepatomegaly.     Impression & Recommendations:  Problem # 1:  ABDOMINAL PAIN (ICD-616.3)  Concern for IBS (diarrhea predominant) given nature of symptoms, negative EGD / colonoscopy. Will start amitryptiline, continue benzodiazepine. Increase fiber. Continue Imodium. Continue to treat GERD. Food diary given,. Follow up in one month.   Orders: [facility]- Est  Level 4 (47829)  Problem #  2:  DYSMENORRHEA (ICD-625.3)  Will discontinue vidocin, start high dose ibuprofen as needed for severe cramping and bleeding with periods. Follow up in one month.   Orders: [facility]- Est  Level 4 ([address])  Problem # 3:  TOBACCO ABUSE (ICD-305.1) Assessment: Unchanged  PRecontemplative. Follow up in one month.   Orders: [facility]- Est  Level 4 (99214)  Complete Medication List: 1)  Zantac 150 Mg Tabs (Ranitidine hcl) .... Two times a  day for reflux 2)  Klonopin 1 Mg Tabs (Clonazepam) .Marland Kitchen.. 1 by mouth two times a day as needed anxiety.do not do first fill until 02/24/10 then may fill every 30 days 3)  Promethazine Hcl 25 Mg Tabs (Promethazine hcl) .... 1/2 - 1 by mouth every 4-6 hours.  will cause drowsiness 4)  Hydrochlorothiazide 12.5 Mg Caps (Hydrochlorothiazide) .Marland Kitchen.. 1 by mouth once daily for blood pressure 5)  Fluticasone Propionate 50 Mcg/act Susp (Fluticasone propionate) .... 2 sprays each nostril daily 6)  Lidocaine Viscous 2 % Soln (Lidocaine hcl) .Marland Kitchen.. 10ml swish and spit every 3 hrs as needed.  disp 7 day supply 7)  Amitriptyline Hcl 50 Mg Tabs (Amitriptyline hcl) .... One tab by mouth at bedtime 8)  Ibuprofen 800 Mg Tabs (Ibuprofen) .... One tab by mouth three times a day as needed severe pain with periods (take for up to 7 days at a time) 9)  Metamucil Smooth Texture 58.6 % Powd (Psyllium) .... Take as directed by mouth once daily. disp one bottle 10)  Imodium A-d 2 Mg Tabs (Loperamide hcl) .... Two tabs by mouth once a day as needed for diarrhea. may repeat 2mg  q4 hrs for up to 16 mg / day total  Patient Instructions: 1)  Follow up in one month. 2)  Eat foods that are high in fiber. (SEE LIST) 3)  Take ibuprofen as directed for pain with periods. 4)  STOP taking other sources of ibuprofen or motrin or Goody powders 5)  START taking Metamucil daily.  6)  START taking Amitryptiline daily.  7)  Stop taking Vicodin for pain with periods - this is NOT an effective medicine for this problem.  Prescriptions: IMODIUM A-D 2 MG TABS (LOPERAMIDE HCL) two tabs by mouth once a day as needed for diarrhea. May repeat 2mg  q4 hrs for up to 16 mg / day total  #60 x 1   Entered and Authorized by:   Bobby Rumpf  MD   Signed by:   Bobby Rumpf  MD on 04/01/2010   Method used:   Electronically to        [facility]* (retail)       [address]       [place], [place]  [address]       Ph: [phone]       Fax: [phone]    RxID:   [id number] METAMUCIL SMOOTH TEXTURE 58.6 % POWD (PSYLLIUM) Take as directed by mouth once daily. Disp one bottle  #1 x 3   Entered and Authorized by:   Bobby Rumpf  MD   Signed by:   Bobby Rumpf  MD on 04/01/2010   Method used:   Electronically to        [facility]* (retail)       [address]       [place], [place]  [address]       Ph: [phone]       Fax: [phone]   RxID:   [id number] IBUPROFEN 800 MG TABS (IBUPROFEN) one tab by mouth  three times a day as needed severe pain with periods (take for up to 7 days at a time)  #30 x 1   Entered and Authorized by:   Bobby Rumpf  MD   Signed by:   Bobby Rumpf  MD on 04/01/2010   Method used:   Electronically to        [facility]* (retail)       [address]       [place], [place]  [address]       Ph: [phone]       Fax: [phone]   RxID:   [id number] AMITRIPTYLINE HCL 50 MG TABS (AMITRIPTYLINE HCL) one tab by mouth at bedtime  #30 x 1   Entered and Authorized by:   Bobby Rumpf  MD   Signed by:   Bobby Rumpf  MD on 04/01/2010   Method used:   Electronically to        [facility]* (retail)       [address]       [place], [place]  [address]       Ph: [phone]       Fax: [phone]   RxID:   [id number]

## 2010-09-25 NOTE — Assessment & Plan Note (Signed)
Summary: F/U PER SMITH/KH   Vital Signs:  Patient profile:   45 year old female Height:      68.5 inches Weight:      175.7 pounds BMI:     26.42 Temp:     98.2 degrees F oral Pulse rate:   80 / minute BP sitting:   123 / 83  (left arm) Cuff size:   regular  Vitals Entered By: Garen Grams LPN (August 06, 2010 11:13 AM) CC: f/u yesterdays visit Is Patient Diabetic? No Pain Assessment Patient in pain? yes     Location: abdomen   Primary Care Provider:  Bobby Rumpf  MD  CC:  f/u yesterdays visit.  History of Present Illness: 1) Abdominal pain: Seen on 12/13 w/ abdominal pain, nausea, diarrhea, possible bright red blood per rectum. Given morphine while in clinic. Started on ciprofloxacin and metronidazole given reported history of diverticulitis. Did not pick up metronidazole yet. Pain and nausea have improved since yesterday.   Last seen by me w/ similar symptoms on 04/01/10, was diagnosed with IBS, started on amitryptiline, continued on Imodium, started on fiber supplementation with significant improvement in symptopms. She reports that she has run out of her amitriptyline months ago, and has not taken her imodium. Patient had an episode of colitis in November 2010, treated with course of cipro and flagyl. Negative colonoscopy (except for internal hemorrhoids) / EGD in March 2011 w/ Dr. Marina Goodell.      Habits & Providers  Alcohol-Tobacco-Diet     Alcohol drinks/day: >5     Alcohol Counseling: to STOP drinking     Alcohol type: all     >5/day in last 3 mos: yes     Feels need to cut down: yes     Feels annoyed by complaints: yes     Feels guilty re: drinking: no     Needs 'eye opener' in am: no     Tobacco Status: current     Tobacco Counseling: to quit use of tobacco products     Cigarette Packs/Day: 0.5  Current Medications (verified): 1)  Klonopin 1 Mg Tabs (Clonazepam) .Marland Kitchen.. 1 By Mouth Two Times A Day As Needed Anxiety. 2)  Promethazine Hcl 25 Mg Tabs (Promethazine  Hcl) .... 1/2 - 1 By Mouth Every 4-6 Hours.  Will Cause Drowsiness 3)  Hydrochlorothiazide 12.5 Mg Caps (Hydrochlorothiazide) .Marland Kitchen.. 1 By Mouth Once Daily For Blood Pressure 4)  Fluticasone Propionate 50 Mcg/act Susp (Fluticasone Propionate) .... 2 Sprays Each Nostril Daily 5)  Amitriptyline Hcl 50 Mg Tabs (Amitriptyline Hcl) .... One Tab By Mouth At Bedtime 6)  Ibuprofen 800 Mg Tabs (Ibuprofen) .... One Tab By Mouth Three Times A Day As Needed Severe Pain With Periods (Take For Up To 7 Days At A Time) 7)  Metamucil Smooth Texture 58.6 % Powd (Psyllium) .... Take As Directed By Mouth Once Daily. Disp One Bottle 8)  Imodium A-D 2 Mg Tabs (Loperamide Hcl) .... Two Tabs By Mouth Once A Day As Needed For Diarrhea. May Repeat 2mg  Q4 Hrs For Up To 16 Mg / Day Total 9)  Tramadol Hcl 50 Mg Tabs (Tramadol Hcl) .... One Tab By Mouth Two Times A Day As Needed For Pain 10)  Ciprofloxacin Hcl 500 Mg Tabs (Ciprofloxacin Hcl) .Marland Kitchen.. 1 Tab By Mouth Two Times A Day For The Next 10 Days 11)  Percocet 7.5-325 Mg Tabs (Oxycodone-Acetaminophen) .Marland Kitchen.. 1 Tab By Mouth Three Times A Day As Needed For Severe Pain For The Next 4  Days  Allergies (verified): 1)  Erythrocin Stearate  Physical Exam  General:  overweight, NAD, laughing  vitals reviewed  Abdomen:  mildly distended, mimimally tender diffusely, normoactive bowel sounds present in all 4 quadrants, no masses, no guarding, and no hepatomegaly., no rebound    Impression & Recommendations:  Problem # 1:  IRRITABLE BOWEL SYNDROME (ICD-564.1) Assessment Deteriorated  Likely diagnosis based on symptomatology. Likely deteriorated in setting of not taking medications as prescribed to prevent flares. No eleveated wbc, no fever, symptoms improving, colonoscopy negative for diverticulosis this year. Will discontinue antibiotics for presumptive diverticulitis for now - consider restart if acutely worsens. Refilled medication, counseled on importance of taking as prescribed.    Follow up in one month to discuss other chronic issues. Avoid triggers that exacerbate episodes. Red flags that would prompt return to care were reviewed with patient and patient expressed understanding.    Orders: [facility]- Est Level  3 (81191)  Complete Medication List: 1)  Klonopin 1 Mg Tabs (Clonazepam) .Marland Kitchen.. 1 by mouth two times a day as needed anxiety. 2)  Promethazine Hcl 25 Mg Tabs (Promethazine hcl) .... 1/2 - 1 by mouth every 4-6 hours.  will cause drowsiness 3)  Hydrochlorothiazide 12.5 Mg Caps (Hydrochlorothiazide) .Marland Kitchen.. 1 by mouth once daily for blood pressure 4)  Fluticasone Propionate 50 Mcg/act Susp (Fluticasone propionate) .... 2 sprays each nostril daily 5)  Amitriptyline Hcl 50 Mg Tabs (Amitriptyline hcl) .... One tab by mouth at bedtime 6)  Ibuprofen 800 Mg Tabs (Ibuprofen) .... One tab by mouth three times a day as needed severe pain with periods (take for up to 7 days at a time) 7)  Metamucil Smooth Texture 58.6 % Powd (Psyllium) .... Take as directed by mouth once daily. disp one bottle 8)  Imodium A-d 2 Mg Tabs (Loperamide hcl) .... Two tabs by mouth once a day as needed for diarrhea. may repeat 2mg  q4 hrs for up to 16 mg / day total 9)  Tramadol Hcl 50 Mg Tabs (Tramadol hcl) .... One tab by mouth two times a day as needed for pain 10)  Ciprofloxacin Hcl 500 Mg Tabs (Ciprofloxacin hcl) .Marland Kitchen.. 1 tab by mouth two times a day for the next 10 days 11)  Percocet 7.5-325 Mg Tabs (Oxycodone-acetaminophen) .Marland Kitchen.. 1 tab by mouth three times a day as needed for severe pain for the next 4 days  Patient Instructions: 1)  I think that this is IBS (irritable bowel syndrome) like we discussed before.  2)  Take your amitriptyline as prescribed daily  3)  Take your fiber daily  4)  Take your Imodium whenever you feel diarrhea coming on. 5)  Take your Klonopin (anxiety medication) whenever you feel your IBS starting to flare.  6)  Follow up in one month. 7)  You should not need to take the  antibiotics - if you feel that things are getting worse then give me a call.  Prescriptions: IMODIUM A-D 2 MG TABS (LOPERAMIDE HCL) two tabs by mouth once a day as needed for diarrhea. May repeat 2mg  q4 hrs for up to 16 mg / day total  #60 x 3   Entered and Authorized by:   Bobby Rumpf  MD   Signed by:   Bobby Rumpf  MD on 08/06/2010   Method used:   Print then Give to Patient   RxID:   [id number] METAMUCIL SMOOTH TEXTURE 58.6 % POWD (PSYLLIUM) Take as directed by mouth once daily. Disp one bottle  #1 x  12   Entered and Authorized by:   Bobby Rumpf  MD   Signed by:   Bobby Rumpf  MD on 08/06/2010   Method used:   Print then Give to Patient   RxID:   [id number] AMITRIPTYLINE HCL 50 MG TABS (AMITRIPTYLINE HCL) one tab by mouth at bedtime Brand medically necessary #30 x 3   Entered and Authorized by:   Bobby Rumpf  MD   Signed by:   Bobby Rumpf  MD on 08/06/2010   Method used:   Print then Give to Patient   RxID:   [id number] PROMETHAZINE HCL 25 MG TABS (PROMETHAZINE HCL) 1/2 - 1 by mouth every 4-6 hours.  will cause drowsiness  #60 x 0   Entered and Authorized by:   Bobby Rumpf  MD   Signed by:   Bobby Rumpf  MD on 08/06/2010   Method used:   Print then Give to Patient   RxID:   [id number] KLONOPIN 1 MG TABS (CLONAZEPAM) 1 by mouth two times a day as needed anxiety. Brand medically necessary #60 x 0   Entered and Authorized by:   Bobby Rumpf  MD   Signed by:   Bobby Rumpf  MD on 08/06/2010   Method used:   Print then Give to Patient   RxID:   [id number]    Orders Added: 1)  [facility]- Est Level  3 [[address]]

## 2010-09-25 NOTE — Progress Notes (Signed)
Summary: Rx Req  Phone Note Call from Patient Call back at Home Phone [phone]   Caller: Patient Summary of Call: Pt has thrown her back out and asking if Dr. Wallene Huh will call in some muscle relaxers to [facility]. Initial call taken by: Clydell Hakim,  April 04, 2010 1:46 PM  Follow-up for Phone Call        Patient needs to be seen before prescribing medication, given that she does not have any muscle relaxants on her medication list. Thanks.  Follow-up by: Bobby Rumpf  MD,  April 08, 2010 9:17 AM  Additional Follow-up for Phone Call Additional follow up Details #1::        Pt informed and agreeable Additional Follow-up by: Jone Baseman CMA,  April 08, 2010 12:14 PM

## 2010-09-25 NOTE — Assessment & Plan Note (Signed)
Summary: Colitis / abdominal pain / GERD   History of Present Illness Visit Type: Initial Consult Primary GI MD: Yancey Flemings MD Primary Provider: Ancil Boozer  MD Requesting Provider: Doralee Albino, MD Chief Complaint: diverticular disease possiblefrom ER visit History of Present Illness:   45 year old white female with hyperlipidemia, anxiety disorder, depression, chronic back pain, and degenerative arthritis. She sent today by [facility] regarding chronic abdominal pain and a myriad of abdominal complaints. The patient states that she has had abdominal complaints for some time. However, a discrete and different more severe episode occurred after Thanksgiving. At that time she describes severe left-sided abdominal pain. Also past bowel movements with blood. She was seen in the emergency room on July 19, 2009. Review of laboratories finds normal liver tests and lipase. CT Scan (reviewed) of the abdomen and pelvis revealed a segmental colitis involving the splenic flexure. She was treated with antibiotics and analgesics. The severe pain has resolved. Current complaints include acid reflux as manifested by heartburn and regurgitation. Currently she takes Zantac 150 mg b.i.d. This is not entirely effective. No dysphagia. She describes her bowel habits as loose. She describes chronic abdominal pain, particularly in the lower abdomen. This is described as severe. She has been prescribed oxycodone regularly her pain. She suggest to me that she is running out of pain medicine and may need more. She is not sure whether her pain is GYN in nature. She has had C-section and tubal ligation. No further rectal bleeding. Slight these symptoms, she has gained 20-25 pounds in the past 3-4 months. Documentation suggest she has gained 5 pounds in 3 months. The description of her pain is vague. She cannot identify any factors that exacerbate the pain. No relieving factors other than pain medication..  Review of outside laboratories from August 08, 2009 finds a normal CBC with a hemoglobin of 12.8, normal comprehensive metabolic panel including albumin of 4.7. Abnormal lipid profile with elevated cholesterol and triglycerides.   GI Review of Systems    Reports abdominal pain, acid reflux, bloating, loss of appetite, nausea, vomiting, and  weight gain.     Location of  Abdominal pain: lower abdomen.    Denies belching, chest pain, dysphagia with liquids, dysphagia with solids, heartburn, vomiting blood, and  weight loss.      Reports change in bowel habits, diarrhea, diverticulosis, rectal bleeding, and  rectal pain.     Denies anal fissure, black tarry stools, constipation, fecal incontinence, heme positive stool, hemorrhoids, irritable bowel syndrome, jaundice, light color stool, and  liver problems.    Current Medications (verified): 1)  Percocet 10-325 Mg Tabs (Oxycodone-Acetaminophen) .... Take 1 Tablet 1 -2 Times Daily As Needed For Pain 2)  Deep Sea Nasal Spray 0.65 % Soln (Saline) 3)  Prilosec 40 Mg Cpdr (Omeprazole) .... One By Mouth Daily For Reflux 4)  Klonopin 1 Mg Tabs (Clonazepam) .Marland Kitchen.. 1 By Mouth Two Times A Day As Needed Anxiety. Do Not Fill Until 07/31/2009 5)  Promethazine Hcl 25 Mg Tabs (Promethazine Hcl) .... 1/2 - 1 By Mouth Every 4-6 Hours.  Will Cause Drowsiness  Allergies (verified): 1)  Erythrocin Stearate (Erythromycin Stearate)  Past History:  Past Medical History: history of cervical cancer  at age 69 - frozen off.  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 Trichomoniasis Anxiety Disorder Hyperlipidemia Urinary Tract Infection  Past Surgical History: Reviewed history from 05/02/2009 and no changes  required. C section x 1 (1988) BTL  Family History: Reviewed history from 05/02/2009 and no changes required. Mom - heart disease, diabetes,  dementia, emphysema (deceased) Dad - massive MI at age 10, heavy drinker (deceased) Brother - 3 strokes  Brother - deceased ([place] in [place] - emphysema) 2 brothers - heavy drinkers Sister - bipolar, ADHD  Social History: has 4 brothers (3 living) and 1 sister.  lives with Uvaldo Rising.  1 son Barbara Cower - born in 47). + alcohol, + tobacco, no drugs. looking for work.  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 originally from [place].  Alcohol Use - yes Daily Caffeine Use  Review of Systems       The patient complains of allergy/sinus, anxiety-new, back pain, cough, fatigue, fever, headaches-new, menstrual pain, night sweats, nosebleeds, and swelling of feet/legs.  The patient denies anemia, arthritis/joint pain, blood in urine, breast changes/lumps, change in vision, confusion, coughing up blood, depression-new, fainting, hearing problems, heart murmur, heart rhythm changes, itching, muscle pains/cramps, pregnancy symptoms, shortness of breath, skin rash, sleeping problems, sore throat, swollen lymph glands, thirst - excessive , urination - excessive , urination changes/pain, urine leakage, vision changes, and voice change.    Vital Signs:  Patient profile:   45 year old female Height:      68.5 inches Weight:      171.38 pounds BMI:     25.77 Pulse rate:   104 / minute Pulse rhythm:   regular BP sitting:   126 / 90  (left arm) Cuff size:   regular  Vitals Entered By: June McMurray CMA Duncan Dull) (October 21, 2009 11:26 AM)  Physical Exam  General:  Well developed, well nourished, no acute distress. Head:  Normocephalic and atraumatic. Eyes:  PERRLA, no icterus. Ears:  Normal auditory acuity. Nose:  No deformity, discharge,  or lesions. Mouth:  No deformity or lesions, al. Neck:  Supple; no masses or thyromegaly. Lungs:  Clear throughout to auscultation. Heart:  Regular rate and rhythm; no murmurs, rubs,  or bruits. Abdomen:  Soft, nontender and nondistended. No masses,  hepatosplenomegaly or hernias noted. Normal bowel sounds. Rectal:  deferred until colonoscopy Msk:  Symmetrical with no gross deformities. Normal posture. Pulses:  Normal pulses noted. Extremities:  No clubbing, cyanosis, edema or deformities noted. Neurologic:  Alert and  oriented x4;  grossly normal neurologically. Skin:  Intact without significant lesions or rashes. Cervical Nodes:  No significant cervical adenopathy. Psych:  Alert and cooperative. Normal mood and affect.   Impression & Recommendations:  Problem # 1:  ABDOMINAL PAIN -GENERALIZED (ICD-789.07) The patient reports chronic lower abdominal pain. The features are nondescript. She certainly appears to have had acute ischemic colitis back in late November. However this has most certainly resolved. Her pain could be GYN in nature. Alternatively, it may be functional. There seems to be some drug seeking behavior on the part of this patient.  Plan: #1. A CT scan of the abdomen and pelvis to confirm resolution of segmental colitis as well as assess for other possible causes for persistent pain. #2. Proceed with colonoscopy and upper endoscopy to further evaluate pain. The nature of the procedures as well as the risks, benefits, and alternatives have been reviewed. She understood and agreed to proceed. #3. Movi prep prescribed. The patient instructed on its use #4. Will not prescribe narcotics unless clear-cut reason for their use #5. A GI workup negative, then she could be referred to a GYN by her primary provider  Problem # 2:  RECTAL  BLEEDING (ICD-569.3) previous report of rectal bleeding associated with abdominal pain. Suspect ischemic colitis. This will be evaluated further with colonoscopy. No anemia  Problem # 3:  ABNORMAL FINDINGS GI TRACT (ICD-793.4) segmental colitis on CT. Clinical picture consistent with transient ischemic colitis  Problem # 4:  GASTROESOPHAGEAL REFLUX DISEASE (ICD-530.81) GERD well-controlled with  b.i.d. H2 receptor antagonist  Plan: #1. Proton pump inhibitor daily. Samples provided. She could obtain Prilosec OTC thereafter #2. Reflux precautions including discontinuing smoking  Other Orders: CT Abdomen/Pelvis with Contrast (CT Abd/Pelvis w/con) [facility] (Col/End [place])  Patient Instructions: 1)  Endo/Colon [facility] 11/13/09 8:00 am 2)  Movi prep Rx. sent to pharmacy for patient to pick up. 3)  CT Abdomen/Pelvis with contrast [facility] 10/22/09 11:00 am 4)  Contrast given to patient to drink with instructions. 5)  Aciphex samples given to patient. 6)  Colonoscopy and Flexible Sigmoidoscopy brochure given.  7)  Upper Endoscopy brochure given.  8)  The medication list was reviewed and reconciled.  All changed / newly prescribed medications were explained.  A complete medication list was provided to the patient / caregiver. 9)  Copy: Dr. Ancil Boozer Prescriptions: MOVIPREP 100 GM  SOLR (PEG-KCL-NACL-NASULF-NA ASC-C) As per prep instructions.  #1 x 0   Entered by:   Milford Cage NCMA   Authorized by:   Hilarie Fredrickson MD   Signed by:   Milford Cage NCMA on 10/21/2009   Method used:   Electronically to        [facility]* (retail)       [address]       [place], [place]  [address]       Ph: [phone]       Fax: [phone]   RxID:   [id number]

## 2010-09-25 NOTE — Assessment & Plan Note (Signed)
Summary: f/up for meds,tcb   Vital Signs:  Patient profile:   45 year old female Height:      68.25 inches Weight:      169 pounds BMI:     25.60 BSA:     1.91 Temp:     99.2 degrees F Pulse rate:   111 / minute BP sitting:   133 / 87  Vitals Entered By: Jone Baseman CMA (October 09, 2009 11:34 AM)  Primary Care Provider:  Ancil Boozer  MD   History of Present Illness: 1. migraine Has had these more frequently -- thinks she's had about 5 since last visit. Describes pain in the back of the neck and radiates throught he entire head. HA [place] pulsating. Intensity is moderate to severe at times a typical of her usual migraines. Usually last about 8 hours. + photophobia, phonophobia. Increased sensitivity to smell. + nausae and vomiting. Has tried imiitrex in the past without help. Currently on excedrin migraine.  Has been using narcotics for abdominal pain and decongestants recently for flu-like symptoms.   2. Abd pain -  Has been going on since November. Pain is in mid-lower abdomen and intermittent, but more painful now. . Not worse with movement or food. Not related to menses. Seen in ED November and CT scan showed splenic flexure diverticulitis. Given a course of cipro and flagyl which helped slightly but she hasn't fully recovered. Started on percocet in Nov by Dr. Sandi Mealy -- taking 2 to 4 of these per day which help. Hasn't obtained the stool studies that Dr. Sandi Mealy ordered. Referred to GI but has had trouble getting in with [place] (has [facility]). Stools variably well-formed and loose. Pain not consistently related to nausea (see below). Has been rescheduled for GI consult with [facility]. Had to cancel appointment due to recent flu. Has appointment on 10/21/08.   No blood in stool. CBC, CMP normal 2 visits ago. Vicodin worsened her nausea so she's taking percocet again for her pain.   3. Nausea/vomiting Has persistent burning "acid" in her stomach with associated nausea. States that this  better, however, with starting prilosec. Ranitidine doesn't seem to help. Phenergan very helpful for the nausea. Milk, tums, pepto-bismol help the nausea.   no bloody emesis   ROS: no fevers/chills. No chest pain. No problems breathing. + decreased appetite. + abdominal bloating.   Current Medications (verified): 1)  Klonopin 1 Mg Tabs (Clonazepam) .Marland Kitchen.. 1 By Mouth Two Times A Day As Needed Anxiety 2)  Percocet 5-325 Mg Tabs (Oxycodone-Acetaminophen) .Marland Kitchen.. 1 Tab 1-2 Times A Day As Needed For Pain 3)  Deep Sea Nasal Spray 0.65 % Soln (Saline) 4)  Prilosec 40 Mg Cpdr (Omeprazole) .... One By Mouth Daily For Reflux 5)  Klonopin 1 Mg Tabs (Clonazepam) .Marland Kitchen.. 1 By Mouth Two Times A Day As Needed Anxiety.  Do Not Fill Until 07/01/2009 6)  Klonopin 1 Mg Tabs (Clonazepam) .Marland Kitchen.. 1 By Mouth Two Times A Day As Needed Anxiety. Do Not Fill Until 07/31/2009 7)  Promethazine Hcl 25 Mg Tabs (Promethazine Hcl) .... 1/2 - 1 By Mouth Every 4-6 Hours.  Will Cause Drowsiness  Allergies (verified): 1)  Erythrocin Stearate (Erythromycin Stearate)  Physical Exam  Additional Exam:  General:  Vital signs reviewed -- obese but otherwise normal Alert, appropriate; well-dressed and well-nourished Lungs:  work of breathing unlabored, clear to auscultation bilaterally; no wheezes, rales, or ronchi; good air movement throughout Heart:  regular rate and rhythm, no murmurs; normal s1/s2 Abd: +BS,  soft, periumbilical TTP with voluntary guarding; no rebound. No ascites. No distension. No mass. +/- Murphy's Pulses:  DP and radial pulses 2+ bilaterally  Extremities:  no cyanosis, clubbing, or edema Neurologic:  alert and oriented. speech normal. Psych: pleasant but somewhat manipulative during our interaction. Frequently grimaces regarding her abdominal pain during the exam. Quite demonstrative. Moves and ambulates easily and without problems. ? borderline features.     Impression & Recommendations:  Problem # 1:  MIGRAINE  HEADACHE (ICD-346.90) Assessment New  Not previously on problem list. Given increasing frequency over past few months, very likely related to increase in pain medication and decongestant use. Therefore will not add another medicine but have pt stop decongestants. Pt's pattern of medication use is concerning especially with narcotics, benzos, phenergan all on board. Advised pt to stop excedrin since there is tylenol in the percocet.   Her updated medication list for this problem includes:    Percocet 5-325 Mg Tabs (Oxycodone-acetaminophen) .Marland Kitchen... 1 tab 1-2 times a day as needed for pain  Orders: [facility]- Est  Level 4 ([address])  Problem # 2:  ABDOMINAL PAIN, PERIUMBILIC (ICD-789.05) Assessment: Deteriorated  Advised pt clearly that I WILL NOT prescribe percocet beyond 10/21/08. Pt displays some borderline personality features and I'm quite sure that long-term narcotic use will be detrimental to both the patient and patient-provider interaction. Therefore will not prescribe further percocet beyond 10/21/09. Emphasized the importance of not putting off this appointment any further. Pt expresses agreement and understanding. increase phenergan to every 4-6 hours. Again, not an ideal long-term medication for this patient.  Orders: [facility]- Est  Level 4 ([address])  Problem # 3:  GASTROESOPHAGEAL REFLUX DISEASE (ICD-530.81) Assessment: Improved  improved on prilosec. continue this medicine. Her updated medication list for this problem includes:    Prilosec 40 Mg Cpdr (Omeprazole) ..... One by mouth daily for reflux  Orders: [facility]- Est  Level 4 ([address])  Complete Medication List: 1)  Klonopin 1 Mg Tabs (Clonazepam) .Marland Kitchen.. 1 by mouth two times a day as needed anxiety 2)  Percocet 5-325 Mg Tabs (Oxycodone-acetaminophen) .Marland Kitchen.. 1 tab 1-2 times a day as needed for pain 3)  Deep Sea Nasal Spray 0.65 % Soln (Saline) 4)  Prilosec 40 Mg Cpdr (Omeprazole) .... One by mouth daily for reflux 5)  Klonopin 1 Mg Tabs  (Clonazepam) .Marland Kitchen.. 1 by mouth two times a day as needed anxiety.  do not fill until 07/01/2009 6)  Klonopin 1 Mg Tabs (Clonazepam) .Marland Kitchen.. 1 by mouth two times a day as needed anxiety. do not fill until 07/31/2009 7)  Promethazine Hcl 25 Mg Tabs (Promethazine hcl) .... 1/2 - 1 by mouth every 4-6 hours.  will cause drowsiness  Patient Instructions: 1)  increase the phenergan to 25 mg every 4-6 hours 2)  continue the percocet only as needed -- remember that I'll not longer fill this beyond 10/21/09.  3)  stop the excedrin because this is tylenol.  4)  stop the decogestants.  5)  follow-up in 4 -6 weeks.  Prescriptions: PROMETHAZINE HCL 25 MG TABS (PROMETHAZINE HCL) 1/2 - 1 by mouth every 4-6 hours.  will cause drowsiness  #60 x 0   Entered and Authorized by:   Myrtie Soman  MD   Signed by:   Myrtie Soman  MD on 10/09/2009   Method used:   Print then Give to Patient   RxID:   [id number] PERCOCET 5-325 MG TABS (OXYCODONE-ACETAMINOPHEN) 1 tab 1-2 times a day as needed for pain  #  30 x 0   Entered and Authorized by:   Myrtie Soman  MD   Signed by:   Myrtie Soman  MD on 10/09/2009   Method used:   Print then Give to Patient   RxID:   [id number] KLONOPIN 1 MG TABS (CLONAZEPAM) 1 by mouth two times a day as needed anxiety  #60 x 0   Entered and Authorized by:   Myrtie Soman  MD   Signed by:   Myrtie Soman  MD on 10/09/2009   Method used:   Print then Give to Patient   RxID:   [id number]

## 2010-09-25 NOTE — Miscellaneous (Signed)
Summary: abd distress  Clinical Lists Changes blood in stool, pain in abd. states she has been seen for this but still not better. worked in with Dr. Domenick Bookbinder at 2pm..[facility] RN  March 21, 2010 11:28 AM   will review note by dr Tye Savoy. Bobby Rumpf  MD  March 23, 2010 6:30 PM

## 2010-09-25 NOTE — Letter (Signed)
Summary: [facility] Instructions  [facility]  [address] [place], [place] [address]   Phone: [phone]  Fax: [phone]       Denise Webb    1966/08/04    MRN: [id number]        Procedure Day /Date:WEDNESDAY, 11/13/09     Arrival Time:7:00 AM     Procedure Time:8:00 AM     Location of Procedure:                     X  [facility] ( Outpatient Registration)                        PREPARATION FOR COLONOSCOPY WITH MOVIPREP/ENDO   Starting 5 days prior to your procedure3/18/11 do not eat nuts, seeds, popcorn, corn, beans, peas,  salads, or any raw vegetables.  Do not take any fiber supplements (e.g. Metamucil, Citrucel, and Benefiber).  THE DAY BEFORE YOUR PROCEDURE         DATE: 11/12/09 DAY: TUESDAY  1.  Drink clear liquids the entire day-NO SOLID FOOD  2.  Do not drink anything colored red or purple.  Avoid juices with pulp.  No orange juice.  3.  Drink at least 64 oz. (8 glasses) of fluid/clear liquids during the day to prevent dehydration and help the prep work efficiently.  CLEAR LIQUIDS INCLUDE: Water Jello Ice Popsicles Tea (sugar ok, no milk/cream) Powdered fruit flavored drinks Coffee (sugar ok, no milk/cream) Gatorade Juice: apple, white grape, white cranberry  Lemonade Clear bullion, consomm, broth Carbonated beverages (any kind) Strained chicken noodle soup Hard Candy                             4.  In the morning, mix first dose of MoviPrep solution:    Empty 1 Pouch A and 1 Pouch B into the disposable container    Add lukewarm drinking water to the top line of the container. Mix to dissolve    Refrigerate (mixed solution should be used within 24 hrs)  5.  Begin drinking the prep at 5:00 p.m. The MoviPrep container is divided by 4 marks.   Every 15 minutes drink the solution down to the next mark (approximately 8 oz) until the full liter is complete.   6.  Follow completed prep with 16 oz of clear liquid of  your choice (Nothing red or purple).  Continue to drink clear liquids until bedtime.  7.  Before going to bed, mix second dose of MoviPrep solution:    Empty 1 Pouch A and 1 Pouch B into the disposable container    Add lukewarm drinking water to the top line of the container. Mix to dissolve    Refrigerate  THE DAY OF YOUR PROCEDURE      DATE: 11/13/09 ATF:TDDUKGURK  Beginning at 3:00 a.m. (5 hours before procedure):         1. Every 15 minutes, drink the solution down to the next mark (approx 8 oz) until the full liter is complete.  2. Follow completed prep with 16 oz. of clear liquid of your choice.    3. You may drink clear liquids until 4:00 AM (4 HOURS BEFORE PROCEDURE).   MEDICATION INSTRUCTIONS  Unless otherwise instructed, you should take regular prescription medications with a small sip of water   as early as possible the morning of your procedure.  OTHER INSTRUCTIONS  You will need a responsible adult at least 43 years of age to accompany you and drive you home.   This person must remain in the waiting room during your procedure.  Wear loose fitting clothing that is easily removed.  Leave jewelry and other valuables at home.  However, you may wish to bring a book to read or  an iPod/MP3 player to listen to music as you wait for your procedure to start.  Remove all body piercing jewelry and leave at home.  Total time from sign-in until discharge is approximately 2-3 hours.  You should go home directly after your procedure and rest.  You can resume normal activities the  day after your procedure.  The day of your procedure you should not:   Drive   Make legal decisions   Operate machinery   Drink alcohol   Return to work  You will receive specific instructions about eating, activities and medications before you leave.    The above instructions have been reviewed and explained to me by   _______________________    I fully understand  and can verbalize these instructions _____________________________ Date _________

## 2010-09-25 NOTE — Progress Notes (Signed)
Summary: Triage / WANTS PAIN MEDS  Phone Note Call from Patient Call back at Home Phone [phone]   Caller: Patient Call For: Dr. Marina Goodell Reason for Call: Talk to Nurse Summary of Call: Pt is having abdominal pain and wants to know if something could be prescribed for pain? Initial call taken by: Karna Christmas,  October 24, 2009 11:42 AM  Follow-up for Phone Call          Pt. says today has generalized abd. pain but is more in lower quadrants as before.Gives a pain level of 8. Was receiving Percocet from Dr Sandi Mealy and Dr. Rexene Alberts but ran out of them at the end of Febuary. Follow-up by: Teryl Lucy RN,  October 24, 2009 12:25 PM  Additional Follow-up for Phone Call Additional follow up Details #1::        DENIED Additional Follow-up by: Hilarie Fredrickson MD,  October 24, 2009 12:54 PM    Additional Follow-up for Phone Call Additional follow up Details #2::     Pt.informed request for pain med. denied. Follow-up by: Teryl Lucy RN,  October 24, 2009 1:47 PM

## 2010-09-25 NOTE — Assessment & Plan Note (Signed)
Summary: F/U/KH   Vital Signs:  Patient profile:   45 year old female Height:      68.5 inches Weight:      177 pounds BMI:     26.62 BSA:     1.95 Temp:     98.7 degrees F Pulse rate:   87 / minute BP sitting:   128 / 85  Vitals Entered By: Jone Baseman CMA (Jan 13, 2010 10:58 AM) CC: f/u abdominal pain/diarrhea, chest tightness/breathing, sleep Is Patient Diabetic? No Pain Assessment Patient in pain? no        Primary Care Provider:  Ancil Boozer  MD  CC:  f/u abdominal pain/diarrhea, chest tightness/breathing, and sleep.  History of Present Illness: abdominal pain/diarrhea: flared back up about 10 days ago including some bloody stools.  blood in stools has since resolved.  has had some nausea with it that responded to phenergan and has since discipated.  has noticed stool to be yellow in color at times.  no specific association with foods that she can find but she does note she has been eating more poorly during this flare (more spicy, greasy foods).  before this flare she had been doing well.  she wonders if this flare could be related to upcoming menses.  she does note several family members she has been around have had a GI bug with vomiting/diarrhea.  she however denies fevers.  does note some bloating.    breahing/chest tightness: has been going on for about 2.5wks.  happens frequently in the spring.  has had to cut back her smoking some as a result.  using her father in law's albuterol inhaler frequently without much improvement.  also some otc delsym which did help and some honey/lemon/whiskey combination which helped.  again denies fevers. + cough  sleep: has been difficult off and on for years.  losing sleep makes her irritable.  has tried tylenol pm without relief but excedrin pm does help.  also tried goody's pwdr with some help.  tosses and turns much of the night and often wakes up feeling like "she's run a marathon."  has had trouble with hot flashes during the  night which makes it harder to sleep.  does read sometime in bed but no TV in the room.  mattress is uncomfortable.  room is dark however. not always going to bed or waking up at the same time.   Habits & Providers  Alcohol-Tobacco-Diet     Tobacco Status: current     Tobacco Counseling: to quit use of tobacco products     Cigarette Packs/Day: 0.5  Current Medications (verified): 1)  Zantac 150 Mg Tabs (Ranitidine Hcl) .... Two Times A Day For Reflux 2)  Klonopin 1 Mg Tabs (Clonazepam) .Marland Kitchen.. 1 By Mouth Two Times A Day As Needed Anxiety. Do Not Fill Until 01/25/2010. 3)  Promethazine Hcl 25 Mg Tabs (Promethazine Hcl) .... 1/2 - 1 By Mouth Every 4-6 Hours.  Will Cause Drowsiness 4)  Hydrochlorothiazide 12.5 Mg Caps (Hydrochlorothiazide) .Marland Kitchen.. 1 By Mouth Once Daily For Blood Pressure 5)  Fluticasone Propionate 50 Mcg/act Susp (Fluticasone Propionate) .... 2 Sprays Each Nostril Daily 6)  Hydrocodone-Acetaminophen 5-500 Mg Tabs (Hydrocodone-Acetaminophen) .Marland Kitchen.. 1 By Mouth Two Times A Day As Needed Pain On Menstrual Cycle.  Do Not Fill Until 01/25/10  Allergies (verified): 1)  Erythrocin Stearate  Past History:  Past medical, surgical, family and social histories (including risk factors) reviewed for relevance to current acute and chronic problems.  Past Medical  History: Reviewed history from 10/21/2009 and no changes required. history of cervical cancer  at age 85 - frozen off.  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 C section x 1 (1988) BTL  Family History: Reviewed history from 05/02/2009 and no changes required. Mom - heart disease, diabetes, dementia, emphysema (deceased) Dad - massive MI at age 14, heavy drinker  (deceased) Brother - 3 strokes  Brother - deceased ([employer] [place] in [place] - emphysema) 2 brothers - heavy drinkers Sister - bipolar, ADHD  Social History: Reviewed history from 10/21/2009 and no changes required. has 4 brothers (3 living) and 1 sister.  lives with Uvaldo Rising.  1 son Barbara Cower - born in 36). + alcohol, + tobacco, no drugs. looking for work.  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 originally from [place].  Alcohol Use - yes Daily Caffeine Use Packs/Day:  0.5  Review of Systems       per HPI  Physical Exam  General:  Well-developed,well-nourished,in no acute distress; alert,appropriate and cooperative throughout examination.  VS noted - improved BP control Lungs:  Normal respiratory effort, chest expands symmetrically. Lungs are clear to auscultation, no crackles or wheezes. Heart:  Normal rate and regular rhythm. S1 and S2 normal without gallop, murmur, click, rub or other extra sounds. Abdomen:  + BS.  soft.  mildly tender diffusely without focality.  no rebound or guarding.  no masses.  mild bloating but again soft.    Impression & Recommendations:  Problem # 1:  ABDOMINAL PAIN -GENERALIZED (ICD-789.07) Assessment Deteriorated  suspect bleeding (after normal colonoscopy) is likely from internal hemorrhoids or upper GI tract (from stomach source? is heavy drinker).  ? gallbladder disease but not definitive on history or exam at this time - hopefully diary will help.  need thorough GU workup - namely pap, STD screening to make sure this isn't part of etiology partiucarly given what seems to be association with menses.  she is aware and plans to schedule this.  for now no changes in meds, f/u for GU exam.   Orders: [facility]- Est  Level 4 (87564)  Problem # 2:  DYSPNEA (ICD-786.05) Assessment: New  exam reassurring.  see pt instructions.  encouraged to quit smoking to help  Orders: [facility]- Est  Level 4 (33295)  Problem # 3:  INSOMNIA, TRANSIENT (ICD-780.52) Assessment:  New  discussed sleep hygiene, okay to try benadryl could consider trazodone in future or med for RLS.  monitor with improved sleep hygine  Orders: [facility]- Est  Level 4 (18841)  Problem # 4:  Preventive Health Care (ICD-V70.0) Assessment: Comment Only due for repeat triglycerides pap mammo check tsh, cbc  Complete Medication List: 1)  Zantac 150 Mg Tabs (Ranitidine hcl) .... Two times a day for reflux 2)  Klonopin 1 Mg Tabs (Clonazepam) .Marland Kitchen.. 1 by mouth two times a day as needed anxiety. do not fill until 01/25/2010. 3)  Promethazine Hcl 25 Mg Tabs (Promethazine hcl) .... 1/2 - 1 by mouth every 4-6 hours.  will cause drowsiness 4)  Hydrochlorothiazide 12.5 Mg Caps (Hydrochlorothiazide) .Marland Kitchen.. 1 by mouth once daily for blood pressure 5)  Fluticasone Propionate 50 Mcg/act Susp (Fluticasone propionate) .... 2 sprays each nostril daily 6)  Hydrocodone-acetaminophen 5-500 Mg Tabs (Hydrocodone-acetaminophen) .Marland Kitchen.. 1 by mouth two times a day as needed pain on menstrual cycle.  do not fill until 01/25/10  Patient Instructions: 1)  Please schedule an appointment for your pap - it is important that we make sure with your bloating, etc that there isn't a female problem going on.  I am not refilling your vicodin again until this is completely evaluated. 2)  For your stomach: keep a diary of what you eat to see if we can identify triggers.  Bring this to your next appt. 3)  for your breathing: it's okay to use the inhaler if you think it is helping - no more than 2 puffs every 4 hours.  you can use some over the counter treatments as well if they help. 4)  For sleep: go to bed and wake up at the same time daily. the bed is only for sleeping. try some benadryl alone to help sleep for now and see if this helps.   Prescriptions: HYDROCODONE-ACETAMINOPHEN 5-500 MG TABS (HYDROCODONE-ACETAMINOPHEN) 1 by mouth two times a day as needed pain on menstrual cycle.  Do not fill until 01/25/10  #14 x 0   Entered and  Authorized by:   Ancil Boozer  MD   Signed by:   Ancil Boozer  MD on 01/13/2010   Method used:   Handwritten   RxID:   [id number] KLONOPIN 1 MG TABS (CLONAZEPAM) 1 by mouth two times a day as needed anxiety. Do not fill until 01/25/2010.  #60 x 0   Entered and Authorized by:   Ancil Boozer  MD   Signed by:   Ancil Boozer  MD on 01/13/2010   Method used:   Handwritten   RxID:   [id number]   Prevention & Chronic Care Immunizations   Influenza vaccine: Not documented    Tetanus booster: Not documented    Pneumococcal vaccine: Not documented  Other Screening   Pap smear: Not documented    Mammogram: Not documented   Smoking status: current  (01/13/2010)  Lipids   Total Cholesterol: 230  (08/08/2009)   LDL: 108  (08/08/2009)   LDL Direct: Not documented   HDL: 44  (08/08/2009)   Triglycerides: 388  (08/08/2009)    SGOT (AST): 17  (08/08/2009)   SGPT (ALT): 14  (08/08/2009)   Alkaline phosphatase: 62  (08/08/2009)   Total bilirubin: 0.3  (08/08/2009)    Lipid flowsheet reviewed?: Yes   Progress toward LDL goal: Unchanged  Hypertension   Last Blood Pressure: 128 / 85  (01/13/2010)   Serum creatinine: 0.73  (08/08/2009)   Serum potassium 4.8  (08/08/2009)    Hypertension flowsheet reviewed?: Yes   Progress toward BP goal: At goal  Self-Management Support :   Personal Goals (by the next clinic visit) :      Personal blood pressure goal: 140/90  (11/25/2009)     Personal LDL goal: 130  (11/25/2009)    Hypertension self-management support: Not documented    Hypertension self-management support not done because: Good outcomes  (01/13/2010)    Lipid self-management support: Not documented     Lipid self-management support not done because: Good outcomes  (01/13/2010)    Self-management comments: at LDL goal. needs repeat triglycerides check

## 2010-09-25 NOTE — Consult Note (Signed)
Summary: [facility] - Endoscopy Procedure Report  [facility] - Endoscopy Procedure Report   Imported By: Clydell Hakim 12/16/2009 14:17:34  _____________________________________________________________________  External Attachment:    Type:   Image     Comment:   External Document

## 2010-09-25 NOTE — Procedures (Signed)
Summary: Upper Endoscopy  Patient: Denise Webb Note: All result statuses are Final unless otherwise noted.  Tests: (1) Upper Endoscopy (EGD)   EGD Upper Endoscopy       DONE     [facility]     [address] [place], [place]  [address]           ENDOSCOPY PROCEDURE REPORT           PATIENT:  Denise, Webb  MR#:  [id number]     BIRTHDATE:  [birth year], 43 yrs. old  GENDER:  female           ENDOSCOPIST:  Wilhemina Bonito. Eda Keys, MD     Referred by:  [facility] (Drs Sandi Mealy and [place]),           PROCEDURE DATE:  11/13/2009     PROCEDURE:  EGD, diagnostic     ASA CLASS:  Class II     INDICATIONS:  abdominal pain, GERD           MEDICATIONS:   There was residual sedation effect present from     prior procedure., Fentanyl 50 mcg IV, Versed 5 mg IV     TOPICAL ANESTHETIC:  Cetacaine Spray           DESCRIPTION OF PROCEDURE:   After the risks benefits and     alternatives of the procedure were thoroughly explained, informed     consent was obtained.  The Pentax Gastroscope [id number] endoscope     was introduced through the mouth and advanced to the second     portion of the duodenum, without limitations.  The instrument was     slowly withdrawn as the mucosa was fully examined.     <<PROCEDUREIMAGES>>           The upper, middle, and distal third of the esophagus were     carefully inspected and no abnormalities were noted. The z-line     was well seen at the GEJ. The endoscope was pushed into the fundus     which was normal including a retroflexed view. The antrum,gastric     body, first and second part of the duodenum were unremarkable.     Retroflexed views revealed no abnormalities.    The scope was then     withdrawn from the patient and the procedure completed.           COMPLICATIONS:  None           ENDOSCOPIC IMPRESSION:     1) Normal EGD     2) Gerd     3) NO GI CAUSE FOR PAIN FOUN OR SUSPECTED           RECOMMENDATIONS:     1)  follow-up with your primary MD           ______________________________     Wilhemina Bonito. Eda Keys, MD           CC:  [facility] (Dr. Meredith Leeds) , The Patient           n.     eSIGNED:   Wilhemina Bonito. Eda Keys at 11/13/2009 09:47 AM           Burney Gauze, [id number]  Note: An exclamation mark (!) indicates a result that was not dispersed into the flowsheet. Document Creation Date: 11/13/2009 9:47 AM _______________________________________________________________________  (1) Order result status: Final Collection or observation date-time: 11/13/2009  09:40 Requested date-time:  Receipt date-time:  Reported date-time:  Referring Physician:   Ordering Physician: Fransico Setters [phone]) Specimen Source:  Source: Launa Grill Order Number: [id number] Lab site:

## 2010-09-25 NOTE — Progress Notes (Signed)
Summary: phn msg  Please call patient and tell her to return to clinic to be seen by any available physician if her symptoms have not improved over the weekend.  Thanks.  Phone Note Call from Patient   Caller: Patient Reason for Call: Insurance Question Summary of Call: Pt checking on blood work she had Monday and wanted to let Dr. know that symptoms she came in with are getting worse. Initial call taken by: Clydell Hakim,  March 28, 2010 11:17 AM

## 2010-09-25 NOTE — Assessment & Plan Note (Signed)
Summary: cpe with pap/kh   Vital Signs:  Patient profile:   45 year old female Height:      68.5 inches Weight:      179.4 pounds BMI:     26.98 Temp:     97.7 degrees F oral Pulse rate:   102 / minute BP sitting:   148 / 94  (left arm) Cuff size:   regular  Vitals Entered By: Garen Grams LPN (February 06, 2010 9:44 AM) CC: cpe with pap,  Is Patient Diabetic? No Pain Assessment Patient in pain? no        Primary Care Provider:  Ancil Boozer  MD  CC:  cpe with pap and .  History of Present Illness: here for CPE with pap.  woudl like STD screening as recently as 6 months ago she tested positive for trichimonas with her partner she is with.  Habits & Providers  Alcohol-Tobacco-Diet     Tobacco Status: current     Tobacco Counseling: to quit use of tobacco products     Cigarette Packs/Day: 1.0  Current Medications (verified): 1)  Zantac 150 Mg Tabs (Ranitidine Hcl) .... Two Times A Day For Reflux 2)  Klonopin 1 Mg Tabs (Clonazepam) .Marland Kitchen.. 1 By Mouth Two Times A Day As Needed Anxiety.do Not Do First Fill Until 02/24/10 Then May Fill Every 30 Days 3)  Promethazine Hcl 25 Mg Tabs (Promethazine Hcl) .... 1/2 - 1 By Mouth Every 4-6 Hours.  Will Cause Drowsiness 4)  Hydrochlorothiazide 12.5 Mg Caps (Hydrochlorothiazide) .Marland Kitchen.. 1 By Mouth Once Daily For Blood Pressure 5)  Fluticasone Propionate 50 Mcg/act Susp (Fluticasone Propionate) .... 2 Sprays Each Nostril Daily 6)  Hydrocodone-Acetaminophen 5-500 Mg Tabs (Hydrocodone-Acetaminophen) .Marland Kitchen.. 1 By Mouth Two Times A Day As Needed Pain On Menstrual Cycle.  May Fill Every 30 Days 7)  Lidocaine Viscous 2 % Soln (Lidocaine Hcl) .Marland Kitchen.. 10ml Swish and Spit Every 3 Hrs As Needed.  Disp 7 Day Supply  Allergies (verified): 1)  Erythrocin Stearate  Past History:  Past medical, surgical, family and social histories (including risk factors) reviewed, and no changes noted (except as noted below).  Past Medical History: Reviewed history from  01/30/2010 and no changes required. history of cervical cancer  at age 10 - "frozen off".  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 - no findings on EGD or colonoscopy (labauer) h/o Trichomoniasis Anxiety Disorder on benzos Hyperlipidemia HTN  Past Surgical History: Reviewed history from 05/02/2009 and no changes required. C section x 1 (1988) BTL  Family History: Reviewed history from 05/02/2009 and no changes required. Mom - heart disease, diabetes, dementia, emphysema (deceased) Dad - massive MI at age 52, heavy drinker (deceased) Brother - 3 strokes  Brother - deceased ([employer] [place] in [place] - emphysema) 2 brothers - heavy drinkers Sister - bipolar, ADHD  Social History: Reviewed history from 01/30/2010 and no changes required. has 4 brothers (3 living) and 1 sister.  lives with Uvaldo Rising (her ex husband but they are back together).  1 son Barbara Cower - born in 30). + alcohol, + tobacco, no drugs. looking for work.  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  Alcohol Use - yes Daily Caffeine Use Packs/Day:  1.0  Review of Systems       throat pain with swallowing after vomiting yesterday evening - no blood in vomitus.  not  relieved with chloraseptic leg cramping for past few weeks since starting HTN med.   heavy menses with pain persist - using as needed vicodin LMP just finished approx 2 days ago. denies fevers, chills, chest pains, worsening shortness of breath, diarrhea, constipation, new rashes does note sbloating intermittently and weight gain  Physical Exam  General:  Well-developed,well-nourished,in no acute distress; alert,appropriate and cooperative throughout examination.  VS noted - borderline htn.  Head:  Normocephalic and atraumatic without obvious abnormalities. No apparent alopecia or  balding. Eyes:  No corneal or conjunctival inflammation noted. EOMI. Perrla.. Vision grossly normal. Ears:  External ear exam shows no significant lesions or deformities.  Otoscopic examination reveals clear canals, tympanic membranes are intact bilaterally without bulging, retraction, inflammation or discharge. Hearing is grossly normal bilaterally. Nose:  External nasal examination shows no deformity or inflammation. Nasal mucosa are pink and moist without lesions or exudates. Mouth:  mild erythema of posterior pharynx.  no abrasions or masses seen.  moist mucus membranes.  good dentition Neck:  supple.  mildly tender on right side where she is having pain but no masses or swelling or LAD Lungs:  Normal respiratory effort, chest expands symmetrically. Lungs are clear to auscultation, no crackles or wheezes. Heart:  Normal rate and regular rhythm. S1 and S2 normal without gallop, murmur, click, rub or other extra sounds. Abdomen:  + BS.  soft.  mildly tender diffusely without focality.  no rebound or guarding.  no masses.  mild bloating but again soft.  Genitalia:  Normal introitus for age, no external lesions, no vaginal discharge, mucosa pink and moist, no vaginal or cervical lesions, no vaginal atrophy, no friaility or hemorrhage, normal uterus size and position, no adnexal masses or tenderness Msk:  no obvious joint abnormalities Pulses:  2+ in all extremities Extremities:  no edema noted today Neurologic:  alert & oriented X3 and gait normal.   Skin:  multiple tattoos noted.  no obviously suspecious lesions or rashes Psych:  Oriented X3, memory intact for recent and remote, normally interactive, good eye contact, and moderately anxious.     Impression & Recommendations:  Problem # 1:  ROUTINE GYNECOLOGICAL EXAMINATION (ICD-V72.31) Assessment New  pap completed.  check STD screens.  given bloating also check TSH, cBC.  given cramping and new antihypertensive check Bmet, Mg, Phos.     refilled vicodin for painful periods.  if these persist and pap WNL then will discuss with her other options (?GYN, etc) - given smoking cannot do estrogen containing contraceptives at her age.   for throat pain - nothing supecious on examination.  given that this was in association with vomiting episode i question mallory weiss tear.  rx viscous lidocaine to use as needed.  monitor.  if not improving then consider ENT or GI referral  advised mammogram today as well for preventative care  Orders: [facility] - Est  40-64 yrs ([id number])  Complete Medication List: 1)  Zantac 150 Mg Tabs (Ranitidine hcl) .... Two times a day for reflux 2)  Klonopin 1 Mg Tabs (Clonazepam) .Marland Kitchen.. 1 by mouth two times a day as needed anxiety.do not do first fill until 02/24/10 then may fill every 30 days 3)  Promethazine Hcl 25 Mg Tabs (Promethazine hcl) .... 1/2 - 1 by mouth every 4-6 hours.  will cause drowsiness 4)  Hydrochlorothiazide 12.5 Mg Caps (Hydrochlorothiazide) .Marland Kitchen.. 1 by mouth once daily for blood pressure 5)  Fluticasone Propionate 50 Mcg/act Susp (Fluticasone propionate) .... 2 sprays each nostril daily 6)  Hydrocodone-acetaminophen 5-500 Mg Tabs (Hydrocodone-acetaminophen) .Marland Kitchen.. 1 by mouth two times a day as needed pain on menstrual cycle.  may fill every 30 days 7)  Lidocaine Viscous 2 % Soln (Lidocaine hcl) .Marland Kitchen.. 10ml swish and spit every 3 hrs as needed.  disp 7 day supply  Other Orders: Comp Met-[facility] [phone] Magnesium-[facility] [phone]) Phosphorus-[facility] [phone]) TSH-[facility] [phone]) CBC-[facility] [phone]) GC/Chlamydia-[facility] ([phone]) Hep C Ab-[facility] [phone]) RPR-[facility] [phone]) HIV-[facility] [phone]) Wet Prep- [facility] ([phone]) Pap Smear-[facility] ([phone]) Hep Bs Ab-[facility] ([phone])  Patient Instructions: 1)  I will send you a letter with the results of your tests. 2)  Try the lidocaine solution for your throat.  If it's too expensive then try cepacol or chloraseptic for your throat.  Eat foods that  are easy to swallow until it heals and let me know if it gets worse. Prescriptions: KLONOPIN 1 MG TABS (CLONAZEPAM) 1 by mouth two times a day as needed anxiety.do not do first fill until 02/24/10 then may fill every 30 days  #60 x 3   Entered and Authorized by:   Ancil Boozer  MD   Signed by:   Terese Door on 02/06/2010   Method used:   Handwritten   RxID:   [id number] LIDOCAINE VISCOUS 2 % SOLN (LIDOCAINE HCL) 10ml swish and spit every 3 hrs as needed.  Disp 7 day supply  #7 x 1   Entered and Authorized by:   Ancil Boozer  MD   Signed by:   Ancil Boozer  MD on 02/06/2010   Method used:   Faxed to ...       [facility]* (retail)       [id number]       [place], [place]  [address]       Ph: [phone]       Fax: [phone]   RxID:   [id number] HYDROCODONE-ACETAMINOPHEN 5-500 MG TABS (HYDROCODONE-ACETAMINOPHEN) 1 by mouth two times a day as needed pain on menstrual cycle.  May Fill every 30 days  #14 x 3   Entered and Authorized by:   Ancil Boozer  MD   Signed by:   Ancil Boozer  MD on 02/06/2010   Method used:   Handwritten   RxID:   [id number]   Laboratory Results  Date/Time Received: February 06, 2010 10:19 AM  Date/Time Reported: February 06, 2010 10:26 AM   [employer] Source: VAGINAL WBC/hpf: 1-5 Bacteria/hpf: 3+  Cocci Clue cells/hpf: many  Positive whiff Yeast/hpf: none Trichomonas/hpf: none Comments: ...........test performed by...........Marland KitchenTerese Door, CMA

## 2010-09-25 NOTE — Assessment & Plan Note (Signed)
Summary: chest congestion/?infection/carew/bmc   Vital Signs:  Patient profile:   45 year old female Weight:      173.8 pounds Temp:     98.5 degrees F oral Pulse rate:   88 / minute BP sitting:   130 / 90  (right arm)  Vitals Entered By: Arlyss Repress CMA, (July 01, 2010 1:42 PM) CC: cough and congestion x 2 weeks Is Patient Diabetic? No Pain Assessment Patient in pain? no        Primary Care Provider:  Bobby Rumpf  MD  CC:  cough and congestion x 2 weeks.  History of Present Illness: Upper respiratory congeston for 2 weeks "has done everyting to get better".  + smoker Denies fever, endorses cough  Habits & Providers  Alcohol-Tobacco-Diet     Tobacco Status: current     Tobacco Counseling: to quit use of tobacco products     Cigarette Packs/Day: 0.5  Current Medications (verified): 1)  Zantac 150 Mg Tabs (Ranitidine Hcl) .... Two Times A Day For Reflux 2)  Klonopin 1 Mg Tabs (Clonazepam) .Marland Kitchen.. 1 By Mouth Two Times A Day As Needed Anxiety. 3)  Promethazine Hcl 25 Mg Tabs (Promethazine Hcl) .... 1/2 - 1 By Mouth Every 4-6 Hours.  Will Cause Drowsiness 4)  Hydrochlorothiazide 12.5 Mg Caps (Hydrochlorothiazide) .Marland Kitchen.. 1 By Mouth Once Daily For Blood Pressure 5)  Fluticasone Propionate 50 Mcg/act Susp (Fluticasone Propionate) .... 2 Sprays Each Nostril Daily 6)  Lidocaine Viscous 2 % Soln (Lidocaine Hcl) .Marland Kitchen.. 10ml Swish and Spit Every 3 Hrs As Needed.  Disp 7 Day Supply 7)  Amitriptyline Hcl 50 Mg Tabs (Amitriptyline Hcl) .... One Tab By Mouth At Bedtime 8)  Ibuprofen 800 Mg Tabs (Ibuprofen) .... One Tab By Mouth Three Times A Day As Needed Severe Pain With Periods (Take For Up To 7 Days At A Time) 9)  Metamucil Smooth Texture 58.6 % Powd (Psyllium) .... Take As Directed By Mouth Once Daily. Disp One Bottle 10)  Imodium A-D 2 Mg Tabs (Loperamide Hcl) .... Two Tabs By Mouth Once A Day As Needed For Diarrhea. May Repeat 2mg  Q4 Hrs For Up To 16 Mg / Day Total 11)  Tramadol Hcl  50 Mg Tabs (Tramadol Hcl) .... One Tab By Mouth Two Times A Day As Needed For Pain 12)  Amoxicillin 875 Mg Tabs (Amoxicillin) .... One Two Times A Day For 7 Days  Allergies (verified): 1)  Erythrocin Stearate  Social History: Packs/Day:  0.5  Review of Systems General:  Denies chills, fever, loss of appetite, and malaise. ENT:  Complains of earache, nasal congestion, postnasal drainage, sinus pressure, and sore throat. Resp:  Complains of cough, sputum productive, and wheezing; denies coughing up blood and shortness of breath.  Physical Exam  General:  Well-developed,well-nourished,in no acute distress; alert,appropriate and cooperative throughout examination Ears:  TM retracted Mouth:  thick post nasal drainage Lungs:  normal respiratory effort and normal breath sounds.   Heart:  normal rate and regular rhythm.     Impression & Recommendations:  Problem # 1:  SINUSITIS, ACUTE (ICD-461.9)  Her updated medication list for this problem includes:    Fluticasone Propionate 50 Mcg/act Susp (Fluticasone propionate) .Marland Kitchen... 2 sprays each nostril daily    Amoxicillin 875 Mg Tabs (Amoxicillin) ..... One two times a day for 7 days  Orders: [facility]- Est Level  3 ([address]109)  Complete Medication List: 1)  Zantac 150 Mg Tabs (Ranitidine hcl) .... Two times a day for reflux 2)  Klonopin 1 Mg Tabs (Clonazepam) .Marland Kitchen.. 1 by mouth two times a day as needed anxiety. 3)  Promethazine Hcl 25 Mg Tabs (Promethazine hcl) .... 1/2 - 1 by mouth every 4-6 hours.  will cause drowsiness 4)  Hydrochlorothiazide 12.5 Mg Caps (Hydrochlorothiazide) .Marland Kitchen.. 1 by mouth once daily for blood pressure 5)  Fluticasone Propionate 50 Mcg/act Susp (Fluticasone propionate) .... 2 sprays each nostril daily 6)  Lidocaine Viscous 2 % Soln (Lidocaine hcl) .Marland Kitchen.. 10ml swish and spit every 3 hrs as needed.  disp 7 day supply 7)  Amitriptyline Hcl 50 Mg Tabs (Amitriptyline hcl) .... One tab by mouth at bedtime 8)  Ibuprofen 800 Mg Tabs  (Ibuprofen) .... One tab by mouth three times a day as needed severe pain with periods (take for up to 7 days at a time) 9)  Metamucil Smooth Texture 58.6 % Powd (Psyllium) .... Take as directed by mouth once daily. disp one bottle 10)  Imodium A-d 2 Mg Tabs (Loperamide hcl) .... Two tabs by mouth once a day as needed for diarrhea. may repeat 2mg  q4 hrs for up to 16 mg / day total 11)  Tramadol Hcl 50 Mg Tabs (Tramadol hcl) .... One tab by mouth two times a day as needed for pain 12)  Amoxicillin 875 Mg Tabs (Amoxicillin) .... One two times a day for 7 days  Other Orders: Influenza Vaccine NON MCR (16109)  Patient Instructions: 1)  Please schedule a follow-up appointment in 1 month with primary MD.  Prescriptions: AMOXICILLIN 875 MG TABS (AMOXICILLIN) one two times a day for 7 days Brand medically necessary #14 x 0   Entered and Authorized by:   Luretha Murphy NP   Signed by:   Luretha Murphy NP on 07/01/2010   Method used:   Print then Give to Patient   RxID:   [id number] AMITRIPTYLINE HCL 50 MG TABS (AMITRIPTYLINE HCL) one tab by mouth at bedtime Brand medically necessary #30 x 1   Entered and Authorized by:   Luretha Murphy NP   Signed by:   Luretha Murphy NP on 07/01/2010   Method used:   Print then Give to Patient   RxID:   [id number] HYDROCHLOROTHIAZIDE 12.5 MG CAPS (HYDROCHLOROTHIAZIDE) 1 by mouth once daily for blood pressure Brand medically necessary #30 x 3   Entered and Authorized by:   Luretha Murphy NP   Signed by:   Luretha Murphy NP on 07/01/2010   Method used:   Print then Give to Patient   RxID:   [id number] KLONOPIN 1 MG TABS (CLONAZEPAM) 1 by mouth two times a day as needed anxiety. Brand medically necessary #60 x 0   Entered and Authorized by:   Luretha Murphy NP   Signed by:   Luretha Murphy NP on 07/01/2010   Method used:   Print then Give to Patient   RxID:   [id number]    Orders Added: 1)  Influenza Vaccine NON MCR [00028] 2)  [facility]- Est Level  3  [63875]   Immunizations Administered:  Influenza Vaccine # 1:    Vaccine Type: Fluvax Non-MCR    Site: left deltoid    Mfr: GlaxoSmithKline    Dose: 0.5 ml    Route: IM    Given by: Arlyss Repress CMA,    Exp. Date: 02/21/2011    Lot #: [id number]    VIS given: 03/18/10 version given July 01, 2010.  Flu Vaccine Consent Questions:    Do you have a history of severe allergic  reactions to this vaccine? no    Any prior history of allergic reactions to egg and/or gelatin? no    Do you have a sensitivity to the preservative Thimersol? no    Do you have a past history of Guillan-Barre Syndrome? no    Do you currently have an acute febrile illness? no    Have you ever had a severe reaction to latex? no    Vaccine information given and explained to patient? yes    Are you currently pregnant? no   Immunizations Administered:  Influenza Vaccine # 1:    Vaccine Type: Fluvax Non-MCR    Site: left deltoid    Mfr: GlaxoSmithKline    Dose: 0.5 ml    Route: IM    Given by: Arlyss Repress CMA,    Exp. Date: 02/21/2011    Lot #: [id number]    VIS given: 03/18/10 version given July 01, 2010.

## 2010-09-25 NOTE — Progress Notes (Signed)
Summary: Rx Req  Phone Note Refill Request Call back at Home Phone [phone] Message from:  Patient  Refills Requested: Medication #1:  PERCOCET 10-325 MG TABS Take 1 tablet 1 -2 times daily as needed for pain had an upper & lower GI this morning and is in pain.  Initial call taken by: Clydell Hakim,  November 13, 2009 2:46 PM  Follow-up for Phone Call        needs to try tylenol or ibuprofen. should not be in significant pain from this procedure.  if she is she needs to call her GI doctor Follow-up by: Ancil Boozer  MD,  November 13, 2009 8:31 PM  Additional Follow-up for Phone Call Additional follow up Details #1::        Pt understands about not getting pain meds for gi procedure, but says she usually gets it for her period eachmonth. Additional Follow-up by: Clydell Hakim,  November 14, 2009 9:45 AM    Additional Follow-up for Phone Call Additional follow up Details #2::    please have her schedule an appt to discuss.  thanks Follow-up by: Ancil Boozer  MD,  November 14, 2009 9:49 AM

## 2010-09-25 NOTE — Assessment & Plan Note (Signed)
Summary: severe abd pain/diarrhea/hx of diverticulitis/bmc   Vital Signs:  Patient profile:   45 year old female Height:      68.5 inches Weight:      177 pounds Temp:     98.6 degrees F Pulse rate:   90 / minute BP sitting:   169 / 101  Vitals Entered By: Jone Baseman CMA (August 05, 2010 11:10 AM) CC: abd pain   Primary Care Provider:  Bobby Rumpf  MD  CC:  abd pain.  History of Present Illness: pt states the abdominal pain has got much worse in the last 2 days and feels like when she had colitis last thanksgiving.  Pt states pain is genetralized, non radiating, stool is same color but a little more runny in nature.  Pt also states that she feels she has seen in her last 2 stools some blood kind of dark clot color. Pt denies any dysuria, any vaginal discharge, has just started her period but nothing out of the ordinary.   Pt states for the pain she decided to take 10 of her 800mg  Ibuprofen to help.   Denies fever, chills, nausea, vomiting, shortness of breath or chest pain  Current Medications (verified): 1)  Zantac 150 Mg Tabs (Ranitidine Hcl) .... Two Times A Day For Reflux 2)  Klonopin 1 Mg Tabs (Clonazepam) .Marland Kitchen.. 1 By Mouth Two Times A Day As Needed Anxiety. 3)  Promethazine Hcl 25 Mg Tabs (Promethazine Hcl) .... 1/2 - 1 By Mouth Every 4-6 Hours.  Will Cause Drowsiness 4)  Hydrochlorothiazide 12.5 Mg Caps (Hydrochlorothiazide) .Marland Kitchen.. 1 By Mouth Once Daily For Blood Pressure 5)  Fluticasone Propionate 50 Mcg/act Susp (Fluticasone Propionate) .... 2 Sprays Each Nostril Daily 6)  Lidocaine Viscous 2 % Soln (Lidocaine Hcl) .Marland Kitchen.. 10ml Swish and Spit Every 3 Hrs As Needed.  Disp 7 Day Supply 7)  Amitriptyline Hcl 50 Mg Tabs (Amitriptyline Hcl) .... One Tab By Mouth At Bedtime 8)  Ibuprofen 800 Mg Tabs (Ibuprofen) .... One Tab By Mouth Three Times A Day As Needed Severe Pain With Periods (Take For Up To 7 Days At A Time) 9)  Metamucil Smooth Texture 58.6 % Powd (Psyllium) .... Take  As Directed By Mouth Once Daily. Disp One Bottle 10)  Imodium A-D 2 Mg Tabs (Loperamide Hcl) .... Two Tabs By Mouth Once A Day As Needed For Diarrhea. May Repeat 2mg  Q4 Hrs For Up To 16 Mg / Day Total 11)  Tramadol Hcl 50 Mg Tabs (Tramadol Hcl) .... One Tab By Mouth Two Times A Day As Needed For Pain 12)  Amoxicillin 875 Mg Tabs (Amoxicillin) .... One Two Times A Day For 7 Days 13)  Ciprofloxacin Hcl 500 Mg Tabs (Ciprofloxacin Hcl) .Marland Kitchen.. 1 Tab By Mouth Two Times A Day For The Next 10 Days 14)  Metronidazole 500 Mg Tabs (Metronidazole) .Marland Kitchen.. 1 Tab By Mouth Two Times A Day For The Next 10 Days 15)  Percocet 7.5-325 Mg Tabs (Oxycodone-Acetaminophen) .Marland Kitchen.. 1 Tab By Mouth Three Times A Day As Needed For Severe Pain For The Next 4 Days  Allergies (verified): 1)  Erythrocin Stearate  Past History:  Past medical, surgical, family and social histories (including risk factors) reviewed, and no changes noted (except as noted below).  Past Medical History: Reviewed history from 01/30/2010 and no changes required. history of cervical cancer  at age 65 - "frozen off".  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 - no findings on EGD or colonoscopy (labauer) h/o Trichomoniasis Anxiety Disorder on benzos Hyperlipidemia HTN  Past Surgical History: Reviewed history from 05/02/2009 and no changes required. C section x 1 (1988) BTL  Family History: Reviewed history from 05/02/2009 and no changes required. Mom - heart disease, diabetes, dementia, emphysema (deceased) Dad - massive MI at age 57, heavy drinker (deceased) Brother - 3 strokes  Brother - deceased ([place] in [place] - emphysema) 2 brothers - heavy drinkers Sister - bipolar, ADHD  Social History: Reviewed history from 01/30/2010 and no changes required. has 4 brothers (3 living) and  1 sister.  lives with Uvaldo Rising (her ex husband but they are back together).  1 son Barbara Cower - born in 47). + alcohol, + tobacco, no drugs. looking for work.  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 originally from [place].  Alcohol Use - yes Daily Caffeine Use  Review of Systems       see hpi  Physical Exam  General:  Well-developed,well-nourished,in no acute distress; alert,appropriate and cooperative throughout examination Eyes:  no conjunctivitis  Mouth:  thick post nasal drainage Neck:  no lymphadenopathy   Lungs:  normal respiratory effort and normal breath sounds.   Heart:  normal rate and regular rhythm.   Abdomen:  mildly distended, mimimally tender diffusely worse RLQ, normoactive bowel sounds present in all 4 quadrants, no masses, no guarding, and no hepatomegaly.   negative psoas sign no rebound tenderness.  Rectal:  no inflammation or skin breakdown hemoccult negative.  Pulses:  2+ Extremities:  no edema   Impression & Recommendations:  Problem # 1:  DIVERTICULITIS, ACUTE (ICD-562.11) appears to be a flare, pt deferred pelvic exam due to pt being on her periosd and being a little embarressed.   Pt was joking and laughing during the physical exam so did not feel need to admit.  Vss and pt has treated this before at home.  Told pt to stop the motrin for now and gave 4 days worth of percocet. Will treat with cipro and flagyl and have pt be seen tomorrow. Pt to make appointment with [facility] physician hopefully. Pt given red flags to look out for and verbalized understanding. Pt also given 2mg  of morphine here in office for pain.  Orders: Comp Met-[facility] [id number]) CBC w/Diff-[facility] (14782) [facility]- Est  Level 4 (95621)  Problem # 2:  ABDOMINAL PAIN, GENERALIZED (ICD-789.07) see above only for labs.  Orders: Comp Met-[facility] [id number]) CBC w/Diff-[facility] (85025) U Preg-[facility] (81025) [facility]- Est  Level 4 (99214) Morphine Sulfate inj 10 mg (J2270)  Problem # 3:  ESSENTIAL HYPERTENSION,  BENIGN (ICD-401.1) elevated but pt is in pain, will check again at follow up.  Her updated medication list for this problem includes:    Hydrochlorothiazide 12.5 Mg Caps (Hydrochlorothiazide) .Marland Kitchen... 1 by mouth once daily for blood pressure  Orders: [facility]- Est  Level 4 (99214)  Complete Medication List: 1)  Zantac 150 Mg Tabs (Ranitidine hcl) .... Two times a day for reflux 2)  Klonopin 1 Mg Tabs (Clonazepam) .Marland Kitchen.. 1 by mouth two times a day as needed anxiety. 3)  Promethazine Hcl 25 Mg Tabs (Promethazine hcl) .... 1/2 - 1 by mouth every 4-6 hours.  will cause drowsiness 4)  Hydrochlorothiazide 12.5 Mg Caps (Hydrochlorothiazide) .Marland Kitchen.. 1 by mouth once daily for blood pressure 5)  Fluticasone Propionate 50 Mcg/act Susp (Fluticasone propionate) .... 2 sprays each nostril daily 6)  Lidocaine Viscous  2 % Soln (Lidocaine hcl) .Marland Kitchen.. 10ml swish and spit every 3 hrs as needed.  disp 7 day supply 7)  Amitriptyline Hcl 50 Mg Tabs (Amitriptyline hcl) .... One tab by mouth at bedtime 8)  Ibuprofen 800 Mg Tabs (Ibuprofen) .... One tab by mouth three times a day as needed severe pain with periods (take for up to 7 days at a time) 9)  Metamucil Smooth Texture 58.6 % Powd (Psyllium) .... Take as directed by mouth once daily. disp one bottle 10)  Imodium A-d 2 Mg Tabs (Loperamide hcl) .... Two tabs by mouth once a day as needed for diarrhea. may repeat 2mg  q4 hrs for up to 16 mg / day total 11)  Tramadol Hcl 50 Mg Tabs (Tramadol hcl) .... One tab by mouth two times a day as needed for pain 12)  Amoxicillin 875 Mg Tabs (Amoxicillin) .... One two times a day for 7 days 13)  Ciprofloxacin Hcl 500 Mg Tabs (Ciprofloxacin hcl) .Marland Kitchen.. 1 tab by mouth two times a day for the next 10 days 14)  Metronidazole 500 Mg Tabs (Metronidazole) .Marland Kitchen.. 1 tab by mouth two times a day for the next 10 days 15)  Percocet 7.5-325 Mg Tabs (Oxycodone-acetaminophen) .Marland Kitchen.. 1 tab by mouth three times a day as needed for severe pain for the next 4  days  Patient Instructions: 1)  Nice to meet you 2)  I am giving you two antibiotics.  Take 1 pill of each two times a day for the next 10 days 3)  I am giving you some percocet for the next 4 days to help with the pain as well 4)  I am giving you a ashot of morphine now as well 5)  I will call if any of the labs are concerning 6)  I want you to have an appointment tomorrow to make sure everything is going allright.  Prescriptions: PERCOCET 7.5-325 MG TABS (OXYCODONE-ACETAMINOPHEN) 1 tab by mouth three times a day as needed for severe pain for the next 4 days  #12 x 0   Entered and Authorized by:   Antoine Primas DO   Signed by:   Antoine Primas DO on 08/05/2010   Method used:   Print then Give to Patient   RxID:   [id number] METRONIDAZOLE 500 MG TABS (METRONIDAZOLE) 1 tab by mouth two times a day for the next 10 days  #20 x 0   Entered and Authorized by:   Antoine Primas DO   Signed by:   Antoine Primas DO on 08/05/2010   Method used:   Electronically to        [facility]* (retail)       [address]       [place], [place]  [address]       Ph: [phone]       Fax: [phone]   RxID:   [id number] CIPROFLOXACIN HCL 500 MG TABS (CIPROFLOXACIN HCL) 1 tab by mouth two times a day for the next 10 days  #20 x 0   Entered and Authorized by:   Antoine Primas DO   Signed by:   Antoine Primas DO on 08/05/2010   Method used:   Electronically to        [facility]* (retail)       [address]       [place], [place]  [address]       Ph: [phone]       Fax: [phone]   RxID:   [id number]  Medication Administration  Injection # 1:    Medication: Morphine Sulfate inj 10 mg    Diagnosis: ABDOMINAL PAIN, GENERALIZED (ICD-789.07)    Route: IM    Site: RUOQ gluteus    Exp Date: 09/25/2011    Lot #: [id number]    Mfr: Hospira    Comments: Patient recieved 2mg  of Morphine    Patient tolerated injection without complications    Given by: Garen Grams LPN (August 05, 2010 11:58 AM)  Orders Added: 1)  Comp Met-[facility] [[address]] 2)  CBC w/Diff-[facility] [85025] 3)  U Preg-[facility] [81025] 4)  [facility]- Est  Level 4 [09811] 5)  Morphine Sulfate inj 10 mg [J2270]     Medication Administration  Injection # 1:    Medication: Morphine Sulfate inj 10 mg    Diagnosis: ABDOMINAL PAIN, GENERALIZED (ICD-789.07)    Route: IM    Site: RUOQ gluteus    Exp Date: 09/25/2011    Lot #: [id number]    Mfr: Hospira    Comments: Patient recieved 2mg  of Morphine    Patient tolerated injection without complications    Given by: Garen Grams LPN (August 05, 2010 11:58 AM)  Orders Added: 1)  Comp Met-[facility] [[id number]] 2)  CBC w/Diff-[facility] [85025] 3)  U Preg-[facility] [81025] 4)  [facility]- Est  Level 4 [13086] 5)  Morphine Sulfate inj 10 mg [J2270]

## 2010-09-25 NOTE — Letter (Signed)
Summary: Generic Letter  [facility]  [address]   [place], [place] [address]   Phone: [phone]  Fax: [phone]    03/25/2010  [facility] [address] [place], [place]  [address]  Dear Ms. BARBOUR-SWAIN,  I hope this letter finds you well. The results of your recent blood work have returned and all lab values are within normal limits.  Tests: (1) CBC with Diff (10010)   WBC                       8.3 K/uL                    4.0-10.5   RBC                       4.01 MIL/uL                 3.87-5.11   Hemoglobin                13.2 g/dL                   95.6-38.7   Hematocrit                39.3 %                      36.0-46.0   MCV                       98.0 fL                     78.0-100.0 ! MCH                       32.9 pg                     26.0-34.0   MCHC                      33.6 g/dL                   56.4-33.2   RDW                       12.1 %                      11.5-15.5   Platelet Count            282 K/uL                    150-400   Granulocyte %             71 %                        43-77   Absolute Gran             5.9 K/uL                    1.7-7.7   Lymph %                   22 %  12-46   Absolute Lymph            1.8 K/uL                    0.7-4.0   Mono %                    6 %                         3-12   Absolute Mono             0.5 K/uL                    0.1-1.0   Eos %                     1 %                         0-5   Absolute Eos              0.1 K/uL                    0.0-0.7   Baso %                    0 %                         0-1   Absolute Baso             0.0 K/uL                    0.0-0.1   Please call the [facility] if you have any questions or concerns.   Sincerely,   Ivy de Lawson Radar  MD   Appended Document: Generic Letter mailed

## 2010-09-25 NOTE — Consult Note (Signed)
Summary: [facility] - Colonoscopy Procedure   [facility] - Colonoscopy Procedure   Imported By: Clydell Hakim 12/16/2009 12:27:39  _____________________________________________________________________  External Attachment:    Type:   Image     Comment:   External Document

## 2010-09-25 NOTE — Progress Notes (Signed)
Summary: refill  Phone Note Refill Request Call back at Home Phone [phone] Message from:  Patient  Refills Requested: Medication #1:  PERCOCET 10-325 MG TABS Take 1 tablet 1 -2 times daily as needed for pain Initial call taken by: De Nurse,  November 11, 2009 3:42 PM  Follow-up for Phone Call        needs appointment before refill if pain is severe enough.  Follow-up by: Ancil Boozer  MD,  November 11, 2009 3:46 PM

## 2010-09-25 NOTE — Assessment & Plan Note (Signed)
Summary: f/u IBS,df   Vital Signs:  Patient profile:   45 year old female Height:      68.5 inches Weight:      171 pounds BMI:     25.71 BSA:     1.92 Temp:     98.3 degrees F Pulse rate:   99 / minute BP sitting:   120 / 84  Vitals Entered By: Jone Baseman CMA (April 29, 2010 10:03 AM) CC: f/u IBS  Is Patient Diabetic? No Pain Assessment Patient in pain? no        Primary Care Provider:  Bobby Rumpf  MD  CC:  f/u IBS .  History of Present Illness: 1) Chronic abdominal pain: See prior history below. Seen by Dr. Wallene Huh on 04/01/10, diagnosed with IBS, started on amitryptiline, continued on Imodium, started on fiber supplementation. Diarrhea and crampy abdominal pain have improved substantially since starting on regimen.   Denies melena, hematochezia, nausea, emesis in interim since last visit.    Since June 2010 - diffuse abdominal pain w/ intermittent episodes of diarrhea. Treated for colitis in November 2010 with course of cipro and flagyl. Negative colonoscopy (except for internal hemorrhoids) / EGD in March 2011 w/ Dr. Marina Goodell.  Seen on 7/29 for complaints of diffuse crampy abdominal; pain, diarrhea (6-8 times per day). Also noted to have a single episode of small amount of painless BRBPR (recent colonoscopy did demonstrate internal hemorrhoids). Also with nausea w/o emesis. History of GERD treated with ranitidine (GERD worse with spicy foods, tomatoes).  LMP was this month (unsure when) - history of dysmenorrhea (treated unsuccessfully with Vicodin). Takes Goody powders, tylenol for abdominal pain without relief. Diarrhea somewhat relieved by imodium.   2) Tobacco use: Precontemplative.   3) URI symptoms: Cough with green / yellow sputum, runny nose, x 3 days. Has been taking Nyquil with relief of symptoms.  Denies chest pain, dyspnea, wheeze, rash, fever, myalgia, sore throat, chills,     Habits & Providers  Alcohol-Tobacco-Diet     Alcohol drinks/day: >5  Alcohol Counseling: to STOP drinking     Alcohol type: all     >5/day in last 3 mos: yes     Feels need to cut down: yes     Feels annoyed by complaints: yes     Feels guilty re: drinking: no     Needs 'eye opener' in am: no     Tobacco Status: current     Tobacco Counseling: to quit use of tobacco products     Cigarette Packs/Day: 1.0  Current Medications (verified): 1)  Zantac 150 Mg Tabs (Ranitidine Hcl) .... Two Times A Day For Reflux 2)  Klonopin 1 Mg Tabs (Clonazepam) .Marland Kitchen.. 1 By Mouth Two Times A Day As Needed Anxiety.do Not Do First Fill Until 02/24/10 Then May Fill Every 30 Days 3)  Promethazine Hcl 25 Mg Tabs (Promethazine Hcl) .... 1/2 - 1 By Mouth Every 4-6 Hours.  Will Cause Drowsiness 4)  Hydrochlorothiazide 12.5 Mg Caps (Hydrochlorothiazide) .Marland Kitchen.. 1 By Mouth Once Daily For Blood Pressure 5)  Fluticasone Propionate 50 Mcg/act Susp (Fluticasone Propionate) .... 2 Sprays Each Nostril Daily 6)  Lidocaine Viscous 2 % Soln (Lidocaine Hcl) .Marland Kitchen.. 10ml Swish and Spit Every 3 Hrs As Needed.  Disp 7 Day Supply 7)  Amitriptyline Hcl 50 Mg Tabs (Amitriptyline Hcl) .... One Tab By Mouth At Bedtime 8)  Ibuprofen 800 Mg Tabs (Ibuprofen) .... One Tab By Mouth Three Times A Day As Needed Severe Pain With  Periods (Take For Up To 7 Days At A Time) 9)  Metamucil Smooth Texture 58.6 % Powd (Psyllium) .... Take As Directed By Mouth Once Daily. Disp One Bottle 10)  Imodium A-D 2 Mg Tabs (Loperamide Hcl) .... Two Tabs By Mouth Once A Day As Needed For Diarrhea. May Repeat 2mg  Q4 Hrs For Up To 16 Mg / Day Total  Allergies (verified): 1)  Erythrocin Stearate  Physical Exam  General:  no acute distress; alert,appropriate and cooperative throughout examination Eyes:  no conjunctivitis  Ears:  TMs clear bilaterally  Nose:  +congestion  Mouth:  moist membranes with mild erythema posterior oropharynx  Neck:  no lymphadenopathy   Lungs:  CTAB w/o wheeze or crackles  Abdomen:  mildly distended, mimimally  tender diffusely, normoactive bowel sounds present in all 4 quadrants, no masses, no guarding, and no hepatomegaly.     Impression & Recommendations:  Problem # 1:  IRRITABLE BOWEL SYNDROME (ICD-564.1)  Improved. Likely diagnosis based on symptomatology. Continue treatment as below. Follow up in three months. Avoid triggers that exacerbate episodes.   Orders: [facility]- Est  Level 4 (45409)  Problem # 2:  TOBACCO ABUSE (ICD-305.1)  Precontemplative. Reassess readiness  to quit at next visit.   Orders: [facility]- Est  Level 4 (81191)  Problem # 3:  URI (ICD-465.9) Assessment: New  Likely viral. Symptomatic treatment discussed. Red flags reviewed. Follow up as needed.  Her updated medication list for this problem includes:    Promethazine Hcl 25 Mg Tabs (Promethazine hcl) .Marland Kitchen... 1/2 - 1 by mouth every 4-6 hours.  will cause drowsiness    Ibuprofen 800 Mg Tabs (Ibuprofen) ..... One tab by mouth three times a day as needed severe pain with periods (take for up to 7 days at a time)  Orders: [facility]- Est  Level 4 (47829)  Complete Medication List: 1)  Zantac 150 Mg Tabs (Ranitidine hcl) .... Two times a day for reflux 2)  Klonopin 1 Mg Tabs (Clonazepam) .Marland Kitchen.. 1 by mouth two times a day as needed anxiety.do not do first fill until 02/24/10 then may fill every 30 days 3)  Promethazine Hcl 25 Mg Tabs (Promethazine hcl) .... 1/2 - 1 by mouth every 4-6 hours.  will cause drowsiness 4)  Hydrochlorothiazide 12.5 Mg Caps (Hydrochlorothiazide) .Marland Kitchen.. 1 by mouth once daily for blood pressure 5)  Fluticasone Propionate 50 Mcg/act Susp (Fluticasone propionate) .... 2 sprays each nostril daily 6)  Lidocaine Viscous 2 % Soln (Lidocaine hcl) .Marland Kitchen.. 10ml swish and spit every 3 hrs as needed.  disp 7 day supply 7)  Amitriptyline Hcl 50 Mg Tabs (Amitriptyline hcl) .... One tab by mouth at bedtime 8)  Ibuprofen 800 Mg Tabs (Ibuprofen) .... One tab by mouth three times a day as needed severe pain with periods (take for up to 7  days at a time) 9)  Metamucil Smooth Texture 58.6 % Powd (Psyllium) .... Take as directed by mouth once daily. disp one bottle 10)  Imodium A-d 2 Mg Tabs (Loperamide hcl) .... Two tabs by mouth once a day as needed for diarrhea. may repeat 2mg  q4 hrs for up to 16 mg / day total  Patient Instructions: 1)  Follow up in 3 months. 2)  Eat foods that are high in fiber. (SEE LIST) 3)  Take ibuprofen as directed for pain with periods. 4)  STOP taking other sources of ibuprofen or motrin or Goody powders 5)  Contine taking Metamucil daily - then start Fiber-lex (in green bottle) 6)  Continue taking Amitryptiline daily.  7)  Take over the counter cough medication (make sure that the cough medication does not have Sudafed or pseudoephedrine, so that it is safe to use with high blood pressure). It will have a red heart on the box to show that it is safe to use. 8)

## 2010-09-25 NOTE — Progress Notes (Signed)
Summary: Rx Prob  Phone Note Call from Patient Call back at [facility] Phone [phone]   Caller: Patient Summary of Call: The new rx that Dr. Rexene Alberts put her on is not working and is causing nausea.  Pt wants to go back on the pain medication that she was on before.   Initial call taken by: Clydell Hakim,  September 11, 2009 10:35 AM  Follow-up for Phone Call        will forward message to Dr. Rexene Alberts Follow-up by: Theresia Lo RN,  September 11, 2009 10:50 AM  Additional Follow-up for Phone Call Additional follow up Details #1::        need to find out if she has follow-up set up with gastroenterology. I will only give the pain med if this has been arranged. Please let me know. Additional Follow-up by: Myrtie Soman  MD,  September 11, 2009 10:52 AM    Additional Follow-up for Phone Call Additional follow up Details #2::    appointment scheduled 09/24/2009 at 10:00 AM with Dr. Marina Goodell of  GI Follow-up by: Jone Baseman CMA,  September 11, 2009 10:54 AM  Additional Follow-up for Phone Call Additional follow up Details #3:: Details for Additional Follow-up Action Taken: will give enough percocet to get to that appointment only. Any further request for pain meds will require office visit. please call pt to advise. thanks.  Additional Follow-up by: Myrtie Soman  MD,  September 11, 2009 11:27 AM  New/Updated Medications: PERCOCET 5-325 MG TABS (OXYCODONE-ACETAMINOPHEN) 1 tab 1-2 times a day as needed for pain Prescriptions: PERCOCET 5-325 MG TABS (OXYCODONE-ACETAMINOPHEN) 1 tab 1-2 times a day as needed for pain  #30 x 0   Entered and Authorized by:   Myrtie Soman  MD   Signed by:   Myrtie Soman  MD on 09/11/2009   Method used:   Print then Give to Patient   RxID:   [id number]  Rx placed up front and pt informed. ............................................... Shanda Bumps [facility] September 11, 2009 11:51 AM

## 2010-09-25 NOTE — Progress Notes (Signed)
Summary: triage  Phone Note Call from Patient Call back at Home Phone [phone]   Caller: Patient Summary of Call: finish her abx and feels like it is coming back - not sure if she needs more abx or does she need to come back in? Initial call taken by: De Nurse,  July 15, 2010 9:45 AM  Follow-up for Phone Call        Seen by Luretha Murphy - will route to her. Follow-up by: Dennison Nancy RN,  July 15, 2010 9:57 AM  Additional Follow-up for Phone Call Additional follow up Details #1::        called patient, she is still smoking and did not start her flonase.  Recommended both, and will unlikely quit smoking but will start the nasal steroid.  This is not infectious but rather inflammatory. Additional Follow-up by: Luretha Murphy NP,  July 15, 2010 10:01 AM

## 2010-09-25 NOTE — Progress Notes (Signed)
  Phone Note Other Incoming   Request: Send information Summary of Call: Request for records received from [facility]. Request forwarded to [facility].     

## 2010-09-25 NOTE — Assessment & Plan Note (Signed)
Summary: abd pain & bloody stools/[place]/carew   Vital Signs:  Patient profile:   45 year old female Height:      68.5 inches Weight:      172 pounds BMI:     25.87 Temp:     98.2 degrees F oral Pulse rate:   118 / minute BP sitting:   137 / 98  (right arm) Cuff size:   regular  Vitals Entered By: Jimmy Footman, CMA (March 21, 2010 2:01 PM) CC: bloody stools x3 days, chronic abdominal pain. vomiting, low grade fevers Is Patient Diabetic? No Pain Assessment Patient in pain? yes     Location: abdomen Type: aching Comments Taking BP meds every other day due to swelling of feet. should be taking every day   Primary Provider:  Bobby Rumpf  MD  CC:  bloody stools x3 days, chronic abdominal pain. vomiting, and low grade fevers.  History of Present Illness: CC: abdominal pain, vomiting and diarrhea  HPI: 45 yo WF w/ hx of rectal bleeding c/o diffuse abdominal pain, vomiting x2, diarrhea 6x/day, and bright red stool.  Onset - 2 weeks ago.  Has been worked up by Dr. Marina Goodell for rectal bleed in 3/11.  No remarkable findings on both endo and colonosopy.  Describes stomach pain as diffuse and sharp.   Loose stool 6x day.  Bright red blood seen on TP and quarter size amt on stool.  Vomited twice in last 2 weeks, most recent episode was 2 days ago.  Has lost  ~7 lbs since last visit in 6/11.  ROS: Postive for weight loss, N/V, abdominal pain, blood in stool, and diarrhea.  Negative for fever, chills, CP, SOB, constipation.  Habits & Providers  Alcohol-Tobacco-Diet     Tobacco Status: current  Current Problems (verified): 1)  Abdominal Pain  (ICD-616.3) 2)  Routine Gynecological Examination  (ICD-V72.31) 3)  Edema  (ICD-782.3) 4)  Leg Cramps  (ICD-729.82) 5)  Screening For Malignant Neoplasm of The Cervix  (ICD-V76.2) 6)  Contact or Exposure To Other Viral Diseases  (ICD-V01.79) 7)  Allergic Rhinitis  (ICD-477.9) 8)  Hx of Rectal Bleeding  (ICD-569.3) 9)  Migraine Headache  (ICD-346.90) 10)   Tobacco Abuse  (ICD-305.1) 11)  Alcohol Abuse  (ICD-305.00) 12)  Dysmenorrhea  (ICD-625.3) 13)  Gastroesophageal Reflux Disease  (ICD-530.81) 14)  Back Pain, Chronic  (ICD-724.5) 15)  Anxiety Depression  (ICD-300.4) 16)  Essential Hypertension, Benign  (ICD-401.1) 17)  Hypertriglyceridemia  (ICD-272.1)  Current Medications (verified): 1)  Zantac 150 Mg Tabs (Ranitidine Hcl) .... Two Times A Day For Reflux 2)  Klonopin 1 Mg Tabs (Clonazepam) .Marland Kitchen.. 1 By Mouth Two Times A Day As Needed Anxiety.do Not Do First Fill Until 02/24/10 Then May Fill Every 30 Days 3)  Promethazine Hcl 25 Mg Tabs (Promethazine Hcl) .... 1/2 - 1 By Mouth Every 4-6 Hours.  Will Cause Drowsiness 4)  Hydrochlorothiazide 12.5 Mg Caps (Hydrochlorothiazide) .Marland Kitchen.. 1 By Mouth Once Daily For Blood Pressure 5)  Fluticasone Propionate 50 Mcg/act Susp (Fluticasone Propionate) .... 2 Sprays Each Nostril Daily 6)  Hydrocodone-Acetaminophen 5-500 Mg Tabs (Hydrocodone-Acetaminophen) .Marland Kitchen.. 1 By Mouth Two Times A Day As Needed Pain On Menstrual Cycle.  May Fill Every 30 Days 7)  Lidocaine Viscous 2 % Soln (Lidocaine Hcl) .Marland Kitchen.. 10ml Swish and Spit Every 3 Hrs As Needed.  Disp 7 Day Supply  Allergies (verified): 1)  Erythrocin Stearate  Past History:  Past Medical History: Last updated: 01/30/2010 history of cervical cancer  at age  22 - "frozen off".  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 - no findings on EGD or colonoscopy (labauer) h/o Trichomoniasis Anxiety Disorder on benzos Hyperlipidemia HTN  Past Surgical History: Last updated: 05/02/2009 C section x 1 (1988) BTL  Risk Factors: Alcohol Use: >5 (05/02/2009) >5 drinks/d w/in last 3 months: yes (05/02/2009) Exercise: yes (05/02/2009)  Risk Factors: Smoking Status: current (03/21/2010) Packs/Day: 1.0  (02/06/2010)  Review of Systems       per hpi  Physical Exam  General:  no acute distress; alert,appropriate and cooperative throughout examination Lungs:  Normal respiratory effort, chest expands symmetrically. Lungs are clear to auscultation, no crackles or wheezes. Heart:  Normal rate and regular rhythm. S1 and S2 normal without gallop, murmur, click, rub or other extra sounds. Abdomen:  minimally distended, active bowel sounds present in all 4 quadrants, no masses, no guarding, and no hepatomegaly.     Impression & Recommendations:  Problem # 1:  ABDOMINAL PAIN (ICD-616.3) Assessment New Pt with hx of rectal bleeding presents with diffuse abdominal pain, N/V, diarrhea, and blood in stool.  Has been worked up for rectal bleeding in the last 6 months by Dr. Marina Goodell.  No significant findings on either endo or colonoscopy.  Will forego DRE and scopes at this time due to recent findings.  Asked pt about weight loss since June.  She admits to trying different diets and less food intake due to fear of vomiting.  Pt's symptoms of vomiting and diarrhea c/w with gastroenteritis.  Will obtain CBC to rule out leukocytosis and questionable diverticulitis.  Advised pt to continue taking OTC Excedrin PM as needed for pain, hydrate with water and gatorade, and refilled Phenergan Rx to help control emesis.  Told pt to return to clinic if develops fever >100.2 and experiences intractable vomiting.  Advised pt to schedule appt with Dr. Wallene Huh to evaluate her chronic issues and update her med list.  Will call or send a letter with lab results. Pt agreed and understood the plan.  Orders: CBC w/Diff-[facility] (04540) [facility]- Est Level  3 (98119)  Complete Medication List: 1)  Zantac 150 Mg Tabs (Ranitidine hcl) .... Two times a day for reflux 2)  Klonopin 1 Mg Tabs (Clonazepam) .Marland Kitchen.. 1 by mouth two times a day as needed anxiety.do not do first fill until 02/24/10 then may fill every 30 days 3)  Promethazine Hcl 25 Mg  Tabs (Promethazine hcl) .... 1/2 - 1 by mouth every 4-6 hours.  will cause drowsiness 4)  Hydrochlorothiazide 12.5 Mg Caps (Hydrochlorothiazide) .Marland Kitchen.. 1 by mouth once daily for blood pressure 5)  Fluticasone Propionate 50 Mcg/act Susp (Fluticasone propionate) .... 2 sprays each nostril daily 6)  Hydrocodone-acetaminophen 5-500 Mg Tabs (Hydrocodone-acetaminophen) .Marland Kitchen.. 1 by mouth two times a day as needed pain on menstrual cycle.  may fill every 30 days 7)  Lidocaine Viscous 2 % Soln (Lidocaine hcl) .Marland Kitchen.. 10ml swish and spit every 3 hrs as needed.  disp 7 day supply  Patient Instructions: 1)  Please take Phenergan as directed. 2)  If symptoms become worse and you vomit more frequently and develop fever over 100.2, please return to clinic. 3)  Continue to drink water and gatorade and maintain a regular diet. 4)  Please follow up with PCP regarding possibility of irritable bowel syndrome and other chronic problems. Prescriptions: PROMETHAZINE HCL 25 MG TABS (PROMETHAZINE HCL)  1/2 - 1 by mouth every 4-6 hours.  will cause drowsiness  #60 x 0   Entered and Authorized by:   Ivy de Lawson Radar  MD   Signed by:   Barnabas Lister  MD on 03/21/2010   Method used:   Print then Give to Patient   RxID:   [id number]

## 2010-11-13 ENCOUNTER — Emergency Department (HOSPITAL_COMMUNITY): Payer: Self-pay

## 2010-11-13 ENCOUNTER — Emergency Department (HOSPITAL_COMMUNITY)
Admission: EM | Admit: 2010-11-13 | Discharge: 2010-11-14 | Disposition: A | Discharge status: Home / Self Care | Payer: Self-pay | Attending: Emergency Medicine | Admitting: Emergency Medicine

## 2010-11-13 ENCOUNTER — Inpatient Hospital Stay (INDEPENDENT_AMBULATORY_CARE_PROVIDER_SITE_OTHER)
Admission: RE | Admit: 2010-11-13 | Discharge: 2010-11-13 | Disposition: A | Discharge status: Home / Self Care | Payer: Self-pay | Source: Ambulatory Visit | Attending: Family Medicine | Admitting: Family Medicine

## 2010-11-13 DIAGNOSIS — R509 Fever, unspecified: Secondary | ICD-10-CM | POA: Insufficient documentation

## 2010-11-13 DIAGNOSIS — I10 Essential (primary) hypertension: Secondary | ICD-10-CM | POA: Insufficient documentation

## 2010-11-13 DIAGNOSIS — E7800 Pure hypercholesterolemia, unspecified: Secondary | ICD-10-CM | POA: Insufficient documentation

## 2010-11-13 DIAGNOSIS — K589 Irritable bowel syndrome without diarrhea: Secondary | ICD-10-CM | POA: Insufficient documentation

## 2010-11-13 DIAGNOSIS — R110 Nausea: Secondary | ICD-10-CM | POA: Insufficient documentation

## 2010-11-13 DIAGNOSIS — R197 Diarrhea, unspecified: Secondary | ICD-10-CM | POA: Insufficient documentation

## 2010-11-13 DIAGNOSIS — K921 Melena: Secondary | ICD-10-CM | POA: Insufficient documentation

## 2010-11-13 DIAGNOSIS — R109 Unspecified abdominal pain: Secondary | ICD-10-CM

## 2010-11-13 DIAGNOSIS — R1032 Left lower quadrant pain: Secondary | ICD-10-CM | POA: Insufficient documentation

## 2010-11-13 DIAGNOSIS — Z79899 Other long term (current) drug therapy: Secondary | ICD-10-CM | POA: Insufficient documentation

## 2010-11-13 HISTORY — DX: Essential (primary) hypertension: I10

## 2010-11-13 LAB — COMPREHENSIVE METABOLIC PANEL
Alkaline Phosphatase: 66 U/L (ref 39–117)
BUN: 5 mg/dL — ABNORMAL LOW (ref 6–23)
Chloride: 100 mEq/L (ref 96–112)
Creatinine, Ser: 0.68 mg/dL (ref 0.4–1.2)
GFR calc non Af Amer: >60 mL/min (ref >60)
Glucose, Bld: 98 mg/dL (ref 70–99)
Potassium: 4.1 mEq/L (ref 3.5–5.1)
Total Bilirubin: 0.4 mg/dL (ref 0.3–1.2)

## 2010-11-13 LAB — CBC
HCT: 40.2 % (ref 36.0–46.0)
MCH: 34.1 pg — ABNORMAL HIGH (ref 26.0–34.0)
MCV: 98.5 fL (ref 78.0–100.0)
RBC: 4.08 MIL/uL (ref 3.87–5.11)
RDW: 12.1 % (ref 11.5–15.5)
WBC: 8.7 10*3/uL (ref 4.0–10.5)

## 2010-11-13 LAB — URINALYSIS, ROUTINE W REFLEX MICROSCOPIC
Glucose, UA: NEGATIVE mg/dL
Hgb urine dipstick: NEGATIVE
Ketones, ur: NEGATIVE mg/dL
Protein, ur: NEGATIVE mg/dL
pH: 5.5 (ref 5.0–8.0)

## 2010-11-13 LAB — DIFFERENTIAL
Eosinophils Relative: 3 % (ref 0–5)
Lymphocytes Relative: 42 % (ref 12–46)
Lymphs Abs: 3.7 10*3/uL (ref 0.7–4.0)
Monocytes Relative: 7 % (ref 3–12)

## 2010-11-13 LAB — SAMPLE TO BLOOD BANK

## 2010-11-14 ENCOUNTER — Encounter (HOSPITAL_COMMUNITY): Payer: Self-pay | Admitting: Radiology

## 2010-11-14 MED ORDER — IOHEXOL 300 MG/ML  SOLN
100.0000 mL | Freq: Once | INTRAMUSCULAR | Status: AC | PRN
  Administered 2010-11-14: 100 mL via INTRAVENOUS

## 2010-11-26 ENCOUNTER — Encounter: Payer: Self-pay | Admitting: Family Medicine

## 2010-11-26 ENCOUNTER — Ambulatory Visit (INDEPENDENT_AMBULATORY_CARE_PROVIDER_SITE_OTHER): Payer: Self-pay | Admitting: Family Medicine

## 2010-11-26 DIAGNOSIS — K589 Irritable bowel syndrome without diarrhea: Secondary | ICD-10-CM

## 2010-11-26 DIAGNOSIS — I10 Essential (primary) hypertension: Secondary | ICD-10-CM

## 2010-11-26 DIAGNOSIS — M722 Plantar fascial fibromatosis: Secondary | ICD-10-CM

## 2010-11-26 LAB — CBC
MCV: 98 fL (ref 78.0–100.0)
RBC: 3.92 MIL/uL (ref 3.87–5.11)
WBC: 12 10*3/uL — ABNORMAL HIGH (ref 4.0–10.5)

## 2010-11-26 LAB — DIFFERENTIAL
Eosinophils Absolute: 0.1 10*3/uL (ref 0.0–0.7)
Lymphs Abs: 1.8 10*3/uL (ref 0.7–4.0)
Monocytes Relative: 5 % (ref 3–12)
Neutrophils Relative %: 79 % — ABNORMAL HIGH (ref 43–77)

## 2010-11-26 LAB — URINALYSIS, ROUTINE W REFLEX MICROSCOPIC
Bilirubin Urine: NEGATIVE
Ketones, ur: NEGATIVE mg/dL
Nitrite: NEGATIVE
Protein, ur: NEGATIVE mg/dL
Urobilinogen, UA: 0.2 mg/dL (ref 0.0–1.0)

## 2010-11-26 LAB — HEPATIC FUNCTION PANEL
AST: 23 U/L (ref 0–37)
Albumin: 4.3 g/dL (ref 3.5–5.2)
Alkaline Phosphatase: 62 U/L (ref 39–117)
Total Bilirubin: 0.8 mg/dL (ref 0.3–1.2)

## 2010-11-26 LAB — LIPASE, BLOOD: Lipase: 24 U/L (ref 11–59)

## 2010-11-26 LAB — URINE MICROSCOPIC-ADD ON

## 2010-11-26 LAB — POCT I-STAT, CHEM 8
BUN: 7 mg/dL (ref 6–23)
Chloride: 106 mEq/L (ref 96–112)
Creatinine, Ser: 0.6 mg/dL (ref 0.4–1.2)
Hemoglobin: 14.3 g/dL (ref 12.0–15.0)
Potassium: 3.8 mEq/L (ref 3.5–5.1)
Sodium: 136 mEq/L (ref 135–145)

## 2010-11-26 MED ORDER — HYDROCHLOROTHIAZIDE 12.5 MG PO CAPS: 12.5000 mg | ORAL_CAPSULE | Freq: Every day | ORAL | Status: DC

## 2010-11-26 MED ORDER — AMITRIPTYLINE HCL 75 MG PO TABS: 75.0000 mg | ORAL_TABLET | Freq: Every day | ORAL | Status: DC

## 2010-11-26 MED ORDER — LOPERAMIDE HCL 2 MG PO TABS: 2.0000 mg | ORAL_TABLET | Freq: Three times a day (TID) | ORAL | Status: DC | PRN

## 2010-11-26 MED ORDER — OXYCODONE-ACETAMINOPHEN 7.5-325 MG PO TABS: 1.0000 | ORAL_TABLET | Freq: Three times a day (TID) | ORAL | Status: DC | PRN

## 2010-11-26 MED ORDER — CLONAZEPAM 1 MG PO TABS: 1.0000 mg | ORAL_TABLET | Freq: Two times a day (BID) | ORAL | Status: DC | PRN

## 2010-11-26 MED ORDER — PROMETHAZINE HCL 25 MG PO TABS: 25.0000 mg | ORAL_TABLET | Freq: Four times a day (QID) | ORAL | Status: DC | PRN

## 2010-11-26 MED ORDER — TRAMADOL HCL 50 MG PO TABS: 50.0000 mg | ORAL_TABLET | Freq: Two times a day (BID) | ORAL | Status: DC

## 2010-11-26 NOTE — Patient Instructions (Signed)
Follow up with me in 3 months if foot pain is not improving. Do the exercises as directed.  I have refilled your medications   - Dr. Wallene Huh

## 2010-11-27 ENCOUNTER — Telehealth: Payer: Self-pay | Admitting: Family Medicine

## 2010-11-27 DIAGNOSIS — M722 Plantar fascial fibromatosis: Secondary | ICD-10-CM | POA: Insufficient documentation

## 2010-11-27 NOTE — Assessment & Plan Note (Signed)
Handout with exercises given. Advised regarding ice at end of day. Advised regarding proper arch support including avoiding flip flops this summer. Follow up in 3 months if not improving. OK to take NSAID prn.

## 2010-11-27 NOTE — Assessment & Plan Note (Addendum)
At goal. Advised regarding high fiber diet. Advised regarding sodium reduction and need to exercise. Follow up six months. Continue HCTZ.

## 2010-11-27 NOTE — Telephone Encounter (Signed)
States that the script for clonopin was written for 30 and she takes 2 per day.  Needs a new script for 60. Please call when ready

## 2010-11-27 NOTE — Assessment & Plan Note (Signed)
Will increase amitriptyline to 75 mg for hopefully better control of symptoms. Continue high fiber diet (even though diarrhea predominant, as there is evidence that high fiber should help with symptoms). Reviewed study enrollment packet - advised that if she felt comfortable with the decision to enroll in the study she would have my full support. Patient may have to stop Imodium - advised regarding possibility of symptom flare.

## 2010-11-27 NOTE — Progress Notes (Signed)
  Subjective:    Patient ID: Denise Webb, female    DOB: Nov 28, 1965, 45 y.o.   MRN: [id number]  HPI  1) Irritable bowel syndrome: Recently seen in ER with complaints of abdominal pain, bloating and diarrhea. Normal labs, normal CT scan abdomen. Diagnosed by me in 2011 with irritable bowel syndrome. She reports that she has had significant improvement in her symptoms since that time since she was started on amitriptyline and fiber supplementation and Imodium but she continues to have flares. She is interested in participating in an FDA trial for a new medication to help with her symptoms. Recent symptoms included nausea, diarrhea, bloating, mild flecks of hematochezia, abdominal pain - but these have resolves since her ER visit. Denies melena, emesis, fever, lethargy.   2) Foot pain: x several months. Bilateral. Sharp. Located at soles of feet bilaterally - starts at calcaneus and radiates to toes. Worse in morning better throughout the day; worse with direct pressure. Usually wears well padded shoes but flip flops in summer. Denies foot swelling, numbness, weakness, trauma, fever, chills. No regular exercise.   3) Hypertension: On HCTZ 12.5 mg without side effects. Does not monitor blood pressure. Does not monitor salt intake. Denies chest pain, dyspnea, LE edema, headache, visual changes   Review of Systems As per HPI otherwise negative.     Objective:   Physical Exam General:  overweight, NAD, pleasant, vitals reviewed  Pulmonary: Clear to auscultation bilaterally without wheeze or crackles.  Cardovascular: Regular rate and rhythm, no murmurs, no JVD, no LE edema  Abdomen:  mildly distended, mimimally tender diffusely, normoactive bowel sounds present in all 4 quadrants, no masses, no guarding, and no hepatomegaly., no rebound  Foot: Moderate loss of longitudinal arch weight bearing. Pain with palpation at insertion of plantar fascia bilateral feet. No nodules palpated. Sensation intact.  Full ROM at ankle but mild pain with dorsiflexion again at insertion of plantar fascia bilaterally      Assessment & Plan:

## 2010-12-02 NOTE — Telephone Encounter (Signed)
Script will be ready for pickup in AM. Please let patient know. Thanks.

## 2010-12-03 ENCOUNTER — Other Ambulatory Visit: Payer: Self-pay | Admitting: Family Medicine

## 2010-12-03 MED ORDER — CLONAZEPAM 1 MG PO TABS: 1.0000 mg | ORAL_TABLET | Freq: Two times a day (BID) | ORAL | Status: DC | PRN

## 2010-12-16 ENCOUNTER — Telehealth: Payer: Self-pay | Admitting: Family Medicine

## 2010-12-16 DIAGNOSIS — K589 Irritable bowel syndrome without diarrhea: Secondary | ICD-10-CM

## 2010-12-16 NOTE — Telephone Encounter (Signed)
Wants to know if she can come in for a blood test for celiac disease?

## 2010-12-18 NOTE — Telephone Encounter (Signed)
Attempted to call patient x 2. No answer. Left message x 1 to call back to discuss getting labs.

## 2010-12-19 NOTE — Telephone Encounter (Signed)
Denise Webb returned your call.  Please call her back

## 2010-12-23 NOTE — Telephone Encounter (Signed)
Called patient. Placed labs. Patient to call for lab visit then come in.

## 2010-12-29 ENCOUNTER — Other Ambulatory Visit: Payer: Self-pay | Admitting: Family Medicine

## 2010-12-29 NOTE — Telephone Encounter (Signed)
Pt asking for her monthly percocet.

## 2010-12-30 ENCOUNTER — Other Ambulatory Visit: Payer: Self-pay | Admitting: Family Medicine

## 2010-12-30 ENCOUNTER — Other Ambulatory Visit: Payer: Self-pay

## 2010-12-30 DIAGNOSIS — K589 Irritable bowel syndrome without diarrhea: Secondary | ICD-10-CM

## 2010-12-30 NOTE — Progress Notes (Signed)
Labs done today Lori Liew 

## 2010-12-31 ENCOUNTER — Other Ambulatory Visit: Payer: Self-pay | Admitting: Family Medicine

## 2010-12-31 LAB — GLIADIN ANTIBODIES, SERUM
Gliadin IgA: 9.7 U/mL (ref <20)
Gliadin IgG: 6.4 U/mL (ref <20)

## 2010-12-31 LAB — TISSUE TRANSGLUTAMINASE, IGA: Tissue Transglutaminase Ab, IgA: 4.5 U/mL (ref <20)

## 2011-01-02 LAB — RETICULIN ANTIBODIES, IGA W TITER

## 2011-01-09 ENCOUNTER — Telehealth: Payer: Self-pay | Admitting: Family Medicine

## 2011-01-09 NOTE — Telephone Encounter (Signed)
Pt informed that results were not back yet and she was okay with that.  She does ask that a letter be mailed to her with the results because she is trying to get disability and needs everything in writing.  Will forward to MD for Berenice Primas, Maryjo Rochester

## 2011-01-09 NOTE — Telephone Encounter (Signed)
Hasn't heard anything from her lab work and would like a copy sent to her.

## 2011-01-09 NOTE — Op Note (Signed)
NAME:  Denise Webb, Denise Webb                            ACCOUNT NO.:  [id number]   MEDICAL RECORD NO.:  [id number]                   PATIENT TYPE:  AMB   LOCATION:  [facility]                                 FACILITY:  [facility]   PHYSICIAN:  Katherine Roan, M.D.               DATE OF BIRTH:  Jun 26, 1966   DATE OF PROCEDURE:  DATE OF DISCHARGE:                                 OPERATIVE REPORT   PREOPERATIVE DIAGNOSIS:  Requests sterilization, heavy painful periods.   POSTOPERATIVE DIAGNOSIS:  Requests sterilization, heavy painful periods.   OPERATION:  Pelvic examination under anesthesia, diagnostic laparoscopy with  laparoscopic tubal ligation, cervical dilatation, hysteroscopy, and  curettage.   SURGEON:  Katherine Roan, M.D.   DESCRIPTION OF PROCEDURE:  The patient was placed in the lithotomy position,  endotracheal anesthesia administered, the uterus was normal in size and  shape and quite mobile with no masses.  The patient was prepped and draped  and the bladder was emptied.  A transverse incision was made in the abdomen  and the abdomen was distended with a Veress needle, aspiration infusion was  accomplished.  The abdomen was distended with 3 liters of CO2 and the trocar  was inserted into the abdomen.  Visualization of the pelvis was  accomplished.  There were some inflammatory cysts on the uterus and maybe  small ceiling fibroids, both ovaries were normal.  There appeared to be a  corpus luteum cyst on the right ovary.   A second puncture was then made in the lower midline using a 5-mm trocar and  I then cauterized the tubes 2 cm lateral to the uterotubal junction and  divided them.  Gas was then carefully evacuated, I did examine the ovaries,  tubes, small bowel, liver, and intra-abdominal contents, there was no  evidence of any trocar injury whatsoever.  Gas was evacuated and the  incisions were closed with 3-0 plain and on the umbilical incision I used 2-  0 Vicryl on a UR6 needle.   Following this, I went down below and carefully  dilated the cervix and inserted the hysteroscope into the uterus.  The  endometrial cavity was quite smooth and I could see both tubal ostia, the  endometrium was then curetted and all the fluid was removed.  [name] tolerated  the procedure well, the uterine curettage was sent to the lab for study.                                               Katherine Roan, M.D.    SDM/MEDQ  D:  01/03/2004  T:  01/03/2004  Job:  [id number]

## 2011-01-09 NOTE — Discharge Summary (Signed)
Denise Webb, Denise Webb          ACCOUNT NO.:  [id number]   MEDICAL RECORD NO.:  [id number]          PATIENT TYPE:  IPS   LOCATION:  0303                          FACILITY:  [facility]   PHYSICIAN:  Anselm Jungling, MD  DATE OF BIRTH:  1966-03-10   DATE OF ADMISSION:  04/09/2005  DATE OF DISCHARGE:  04/12/2005                                 DISCHARGE SUMMARY   IDENTIFYING DATA AND REASON FOR ADMISSION:  This was the first [facility] admission  for this 45 year old married white female.  Her husband of 19 years had just  left her one month ago for a close friend of hers.  She had been devastated  by this, and was incapacitated by depression to the point that she could not  work or concentrate and cried constantly.  She was also going to be losing  her home because her husband was going to be selling the house.  She had  been suicidal, wanting to go to sleep and never wake up.  She had severe  insomnia and had lost 20 pounds in one month.  Please refer to the admission  note for further details pertaining to the symptoms, circumstances, and  history that led to her hospitalization.   The patient came to [place] on a regimen of Effexor XR 150 mg p.o. daily, which  was increased to 150 mg daily plus an additional 75 mg daily.  She was also  admitted with a previous regimen of Xanax 1 mg b.i.d. which was continued  throughout and after discharge.  She was taking Lasix 20 mg daily for  hypertension, and an over-the-counter anti-diarrheal.  Lasix was continued  throughout her stay and following.   INITIAL DIAGNOSTIC IMPRESSION:  AXIS I:  Major depressive disorder, rule out  adjustment disorder with depressed mood.  AXIS II:  Deferred.  AXIS III:  History of hypertension.  AXIS IV:  Stressors, severe.  AXIS V:  Global assessment of function on admission 40.   MEDICAL AND LABORATORY:  The patient was continued on her usual Lasix.  Admission laboratory was essentially within normal limits, with the  exception of very slightly increased AST on her chemistry panel.  A TSH was  within normal limits.   HOSPITAL COURSE:  The patient was admitted to the adult inpatient service.  She presented as a pleasant, alert, insightful individual.  She denied any  active suicidal ideation at the time of admission and throughout her  inpatient stay.  She appeared to be tolerating Effexor XR and the increased  in Effexor dose without any difficulty.   On the third day of hospital treatment, she requested discharge, stating  that she felt well enough to go home.  She was less depressed, and denied  suicidal ideation.   AFTERCARE PLANS:  The patient had indicated that her private medical doctor  would refer her to an individual therapist in [place].   DISCHARGE MEDICATIONS:  Effexor XR 225 mg p.o. daily, Xanax 1 mg twice a  day, and Lasix 20 mg daily.   DISCHARGE DIAGNOSES:  AXIS I:  Major depressive disorder, severe.  AXIS II:  Deferred.  AXIS III:  History of edema, hypertension.  AXIS IV:  Stressors, severe.  AXIS V:  Global assessment of function on discharge 60.           ______________________________  Anselm Jungling, MD  Electronically Signed     SPB/MEDQ  D:  05/06/2005  T:  05/06/2005  Job:  [id number]

## 2011-01-14 ENCOUNTER — Telehealth: Payer: Self-pay | Admitting: Family Medicine

## 2011-01-14 NOTE — Telephone Encounter (Signed)
Called to inform that all testing for celiac disease was negative. Left message.

## 2011-01-14 NOTE — Telephone Encounter (Signed)
Message copied by Bobby Rumpf on Wed Jan 14, 2011  9:01 AM ------      Message from: Tivis Ringer      Created: Wed Dec 31, 2010  3:00 PM                   ----- Message -----         From: [facility] [place] Interface         Sent: 12/31/2010   2:40 PM           To: Richrd Prime Hensel

## 2011-01-15 NOTE — Progress Notes (Signed)
Reviewed. Result negative. Paper result reviewed.

## 2011-01-15 NOTE — Progress Notes (Signed)
Negative (reviewed paper result)

## 2011-01-22 NOTE — Telephone Encounter (Signed)
I had already discussed with Denise Webb (at previous appointments)  that I could not refill her Percocet because it was not indicated (she was started on this for her periods by her previous PCP and narcotics are generally not indicated for menstrual pain).

## 2011-01-23 NOTE — Telephone Encounter (Signed)
Pt has not returned call as of yet, will relay message if she calls back.

## 2011-02-23 ENCOUNTER — Telehealth: Payer: Self-pay | Admitting: Family Medicine

## 2011-02-23 MED ORDER — CLONAZEPAM 1 MG PO TABS: 1.0000 mg | ORAL_TABLET | Freq: Two times a day (BID) | ORAL | Status: DC | PRN

## 2011-02-23 NOTE — Telephone Encounter (Signed)
Needs refill on clonopin - pls fax to [facility], [place]

## 2011-02-23 NOTE — Telephone Encounter (Signed)
Olegario Messier I will fill one time and she needs appt w her new PCP before any more refills Can you call this in as below [facility]! Denny Levy

## 2011-02-23 NOTE — Telephone Encounter (Signed)
Refill called in. 

## 2011-02-23 NOTE — Telephone Encounter (Signed)
Will get preceptor to write script then I will fax.

## 2011-03-17 ENCOUNTER — Telehealth: Payer: Self-pay | Admitting: Family Medicine

## 2011-03-17 NOTE — Telephone Encounter (Signed)
Is having a severe attach of IBS and would like a prescription for some pain medication.  She was last prescribed Percocet.  If this is possible, please give her a call and let her know.

## 2011-03-17 NOTE — Telephone Encounter (Signed)
Spoke with pt and made appt for Friday [facility], [employer]

## 2011-03-20 ENCOUNTER — Encounter: Payer: Self-pay | Admitting: Family Medicine

## 2011-03-20 ENCOUNTER — Ambulatory Visit (INDEPENDENT_AMBULATORY_CARE_PROVIDER_SITE_OTHER): Payer: Self-pay | Admitting: Family Medicine

## 2011-03-20 VITALS — BP 148/97 | HR 89 | Temp 98.4°F | Ht 68.5 in | Wt 171.2 lb

## 2011-03-20 DIAGNOSIS — I10 Essential (primary) hypertension: Secondary | ICD-10-CM

## 2011-03-20 DIAGNOSIS — K589 Irritable bowel syndrome without diarrhea: Secondary | ICD-10-CM

## 2011-03-20 DIAGNOSIS — F341 Dysthymic disorder: Secondary | ICD-10-CM

## 2011-03-20 DIAGNOSIS — K219 Gastro-esophageal reflux disease without esophagitis: Secondary | ICD-10-CM

## 2011-03-20 MED ORDER — PROMETHAZINE HCL 25 MG PO TABS: 25.0000 mg | ORAL_TABLET | Freq: Four times a day (QID) | ORAL | Status: DC | PRN

## 2011-03-20 MED ORDER — CLONAZEPAM 1 MG PO TABS: 1.0000 mg | ORAL_TABLET | Freq: Two times a day (BID) | ORAL | Status: DC | PRN

## 2011-03-20 MED ORDER — LOPERAMIDE HCL 2 MG PO TABS: 2.0000 mg | ORAL_TABLET | Freq: Three times a day (TID) | ORAL | Status: DC | PRN

## 2011-03-20 MED ORDER — AMITRIPTYLINE HCL 75 MG PO TABS
75.0000 mg | ORAL_TABLET | Freq: Every day | ORAL | Status: DC
Start: 2011-03-20 — End: 2011-05-28

## 2011-03-20 MED ORDER — DICYCLOMINE HCL 10 MG PO CAPS: 10.0000 mg | ORAL_CAPSULE | Freq: Four times a day (QID) | ORAL | Status: DC | PRN

## 2011-03-20 MED ORDER — OXYCODONE-ACETAMINOPHEN 7.5-325 MG PO TABS: 1.0000 | ORAL_TABLET | ORAL | Status: DC | PRN

## 2011-03-20 MED ORDER — OXYCODONE-ACETAMINOPHEN 2.5-325 MG PO TABS: 1.0000 | ORAL_TABLET | ORAL | Status: DC | PRN

## 2011-03-20 MED ORDER — HYDROCHLOROTHIAZIDE 12.5 MG PO CAPS: 12.5000 mg | ORAL_CAPSULE | Freq: Every day | ORAL | Status: DC

## 2011-03-20 NOTE — Assessment & Plan Note (Signed)
Significant IBS symptoms for past week. Will continue current Rx of Percocet PRN pain, Imodium, and PHenergan for nausea. Would like to see her off the percocet in the future. Will add Bentyl 10mg  Qid. Would like to see her back in 1 month for further evaluation and discussion of how to proceed with condition.

## 2011-03-20 NOTE — Assessment & Plan Note (Signed)
Well controlled on Zantac 150mg  2-3x daily. No change at this time

## 2011-03-20 NOTE — Assessment & Plan Note (Signed)
BP 148/97 today. Elevated BP likely due to pain. Continue current HCTZ regimen.

## 2011-03-20 NOTE — Assessment & Plan Note (Signed)
Doing well. No complaints today. Klonopin working well. No change at this time

## 2011-03-20 NOTE — Patient Instructions (Addendum)
Thank you for coming into the office today. I would like to start you on Bentyl 10mg  four times daily. THis medication is an anti-spasmodic that should help with your overall symptoms I will also be rewriting your percocet. Please only take this medication for breakthrough pain. I would like to see you back in 1 month to reevaluate your IBS and see if we need to make any further changes to your medical regimen If your symptoms do not improve or worsen over the next week please call to make an appointment or go to the ER.  I look forward to seeing you next month.       Diet & IBS  (Irritable Bowel Syndrome)  No cure has been found for IBS. Many options are available to treat the symptoms. Your caregivers will give you the best treatments available for your symptoms. They will also encourage you to manage stress and make changes to your diet. You need to work with your doctor and Registered Dietician to find the best combination of medicine, diet, counseling, and support to control your symptoms. The following are some diet suggestions. SOME FOOD MAKE IBS WORSE These foods are:  Fatty foods like french fries.   Milk products like cheese or ice cream.   Chocolate.   Alcohol.   Caffeine (found in coffee and some sodas).   Carbonated drinks like soda.  If certain foods cause symptoms, you should eat less of them or stop eating them. CAREFUL EATING REDUCES IBS SYMPTOMS.    Before changing your diet, keep a journal of the foods that seem to cause distress. Then discuss your findings with your caregivers. Write down this information:   What you are eating during the day and when.   What problems are you having after eating?   When do the symptoms occur in relation to your meals?   What foods always make you feel badly?   Take your notes with you to the caregiver to see if you should stop eating certain foods.  SOME FOODS MAKE IBS BETTER. Fiber reduces IBS symptoms, especially  constipation, because it makes stools soft, bulky, and easier to pass. Fiber is found in bran, bread, cereal, beans, fruit, and vegetables.   Some examples of foods with fiber:  Fruits.   Apples.     Peaches.    Pears.     Berries.     Figs.      Vegetables.   Broccoli, raw.     Cabbage.     Carrots.   Raw peas.       Breads, cereals, and beans.   Kidney beans.   Lima beans.      Whole-grain bread.   Whole-grain cereal.       Add foods with fiber to your diet a little at a time. This will let your body get used to them. Too much fiber at once might cause gas and swelling of your belly (abdomen). This can trigger symptoms in a person with IBS.   Caregivers usually recommend a diet with enough fiber to produce soft, painless bowel movements. High-fiber diets may cause gas and bloating. These symptoms often go away within a few weeks as your body adjusts. In many cases, dietary fiber may lessen IBS symptoms, particularly constipation. However, it may not help pain or diarrhea. High-fiber diets keep the colon mildly distended (enlarged with the added fiber). This may help prevent spasms in the colon. Some forms of fiber also keep water in the  stool, thereby preventing hard stools that are difficult to pass.   Besides telling you to eat more foods with fiber, your caregiver may also tell you to get more fiber by taking a fiber pill or drinking water mixed with a special high-fiber powder. An example of this is a natural fiber laxative containing psyllium seed.  HOW MUCH YOU EAT IS IMPORTANT:  Large meals can cause cramping and diarrhea in people with IBS.   If this happens to you, try eating 4 or 5 small meals a day.   Or, have your usual 3 meals, but eat less at each meal.   It may also help if your meals are low in fat and high in carbohydrates. Examples of carbohydrates are pasta, rice, whole-grain breads and cereals, fruits and vegetables.   If dairy products cause  your symptoms to flare up, you can try eating less of those foods.   You might be able to handle yogurt better than other dairy products because it contains bacteria that helps with digestion.   Dairy products are an important source of calcium and other nutrients. If you need to avoid dairy products, be sure to talk with a Registered Dietitian about getting these nutrients through other food sources.   Drinking 6 to 8 glasses of plain water a day is important, especially if you have diarrhea.  *You may want to consult a Registered Dietitian who can help you make changes to your diet.   FOR MORE INFORMATION [employer]  [address] [place], [place] [address] Email: iffgd@iffgd .org   Internet: www.iffgd.org   National Digestive Diseases Information Clearinghouse (NIDDK)  [address] [place], [place] [address] Email: nddic@info .[employer].si   Document Released: 10/31/2003 Document Re-Released: 01/28/2010 [facility] Patient Information 2011 [place], [place].

## 2011-03-20 NOTE — Progress Notes (Signed)
  Subjective:    Patient ID: Denise Webb, female    DOB: 05-10-1966, 45 y.o.   MRN: [id number]  HPI IBS - significant flare-up for past week. 10-12 watery BMs daily. Denies hematochezia, fever, recent illness, melena. Sleeping 3-4hrs a night. Pain is crampy and spasmatic in nature and a 10/10. She gets some relief using a heating pad and by taking tramadol and imodium. Has not been able to eat anything other than plane rice for the past couple of days. Was seen a year ago by GI where she had an upper and lower done which were normal. Feels like she's lost a bunch of weight.   BP - elevated today but pt in significant pain. Pt states it is usually lower. No side effects from the HCTZ  GERD - Well controlled on the Zantac. No recent flares   Review of Systems See above    Objective:   Physical Exam  Gen - no acute distress HEENT - no cervical adenopathy, no neck masses or tracheal deviation. no maxillary or frontal sinus pressure,  CV - RRR, no m/r/g Res - CTAB, normal effort Abd - NABS, painful to palpation over the middle of the stomach. Pt would not let me palpate very much Extremities - <3 sec cap refill     Assessment & Plan:   ANXIETY DEPRESSION Doing well. No complaints today. Klonopin working well. No change at this time  ESSENTIAL HYPERTENSION, BENIGN BP 148/97 today. Elevated BP likely due to pain. Continue current HCTZ regimen.   GASTROESOPHAGEAL REFLUX DISEASE Well controlled on Zantac 150mg  2-3x daily. No change at this time  Irritable bowel syndrome Significant IBS symptoms for past week. Will continue current Rx of Percocet PRN pain, Imodium, and PHenergan for nausea. Would like to see her off the percocet in the future. Will add Bentyl 10mg  Qid. Would like to see her back in 1 month for further evaluation and discussion of how to proceed with condition.

## 2011-04-16 ENCOUNTER — Encounter: Payer: Self-pay | Admitting: Family Medicine

## 2011-04-16 ENCOUNTER — Ambulatory Visit (INDEPENDENT_AMBULATORY_CARE_PROVIDER_SITE_OTHER): Payer: Self-pay | Admitting: Family Medicine

## 2011-04-16 VITALS — BP 144/103 | HR 103 | Temp 98.2°F | Ht 68.5 in | Wt 169.9 lb

## 2011-04-16 DIAGNOSIS — I10 Essential (primary) hypertension: Secondary | ICD-10-CM

## 2011-04-16 DIAGNOSIS — K589 Irritable bowel syndrome without diarrhea: Secondary | ICD-10-CM

## 2011-04-16 MED ORDER — DICYCLOMINE HCL 20 MG PO TABS: 40.0000 mg | ORAL_TABLET | Freq: Four times a day (QID) | ORAL | Status: DC

## 2011-04-16 MED ORDER — DICYCLOMINE HCL 20 MG PO TABS: 20.0000 mg | ORAL_TABLET | Freq: Four times a day (QID) | ORAL | Status: DC

## 2011-04-16 MED ORDER — OXYCODONE-ACETAMINOPHEN 7.5-325 MG PO TABS: 1.0000 | ORAL_TABLET | Freq: Four times a day (QID) | ORAL | Status: DC | PRN

## 2011-04-16 NOTE — Assessment & Plan Note (Signed)
BP elevated today. Pt says she was in pain when it was taken. Pt will try to take her BP at her pharmacy when she is not having a painful episode. No change to her BP regimen at this time.

## 2011-04-16 NOTE — Progress Notes (Signed)
  Subjective:    Patient ID: Denise Webb, female    DOB: [birth year], 45 y.o.   MRN: [id number]  HPI IBS: Symptoms have not improved since starting the Bentyl 10mg  QID. Pt self-increased her dose to 20mg  QID w/ mild improvement in symptoms. Continues to have frequent flares that keep her doubled over in pain. No discernable triggers food wise or emotional. Manages diarrhea w/ imodium, manages her pain w/ percocet and tramadol intermittently. Interested in trying probiotics.   HTN: In pain at time of BP check.   Pt signed pain contract today   Review of Systems Denies fever, heartburn, CP, SOB, hematochezia, hematemesis, syncope,     Objective:   Physical Exam  Gen: NAD, lying on exam table in fetal position HEENT: normal cervical adenopathy, thyroid normal, trachea midline CV: RRR, no M/R/G Res: CTAB, nml effort ABD: NABS, diffusely tender to palpation      Assessment & Plan:

## 2011-04-16 NOTE — Patient Instructions (Addendum)
Thank you for coming to clinic today. I think we can improve on your IBS by increasing your Bentyl to 40mg  QID. This is the max dose you can take with this medication. While I am hopeful this medication can provide some relief, I would like you to keep track of any stressors in your life that may cause flares. We can discuss these at your next appointment and work towards a better long-term management of your IBS that will allow you to better function every day. Feel free to try Probiotics if you would like. Stop taking them if you find they cause your IBS to flare up. Please come back in 3 months or sooner if needed.

## 2011-04-16 NOTE — Assessment & Plan Note (Signed)
Still having significant painful episodes. Bentyl 10 and 20mg  QID with mild improvement. Will increase her dose to 40mg  QID. Also spoke to the pt at length about trying to manage her symptoms, especially her pain, rather than trying to cure her condition. She will keep a record of exacerbations and their causes. I hope to be able to identify what is causing her flares (stressors, foods, etc), so we can decrease the number of episodes in the future. We spoke at length about Cognitive Behavioral Therapy and the possible benefit she may get from it if she doesn't get relief through medications and lifestyle changes. The pt expressed interest in trying probiotics, which I am fine with. Poor data to back up their use, but she may get significant improvement.

## 2011-05-12 ENCOUNTER — Telehealth: Payer: Self-pay | Admitting: *Deleted

## 2011-05-12 NOTE — Telephone Encounter (Signed)
Patient called to ask for refills on klonopin and oxycodone. Notice put in Intern To Do Box for Dr. Durene Cal.

## 2011-05-13 ENCOUNTER — Other Ambulatory Visit: Payer: Self-pay | Admitting: Family Medicine

## 2011-05-13 DIAGNOSIS — F341 Dysthymic disorder: Secondary | ICD-10-CM

## 2011-05-13 DIAGNOSIS — K589 Irritable bowel syndrome without diarrhea: Secondary | ICD-10-CM

## 2011-05-13 MED ORDER — CLONAZEPAM 1 MG PO TABS: 1.0000 mg | ORAL_TABLET | Freq: Two times a day (BID) | ORAL | Status: DC | PRN

## 2011-05-13 MED ORDER — OXYCODONE-ACETAMINOPHEN 7.5-325 MG PO TABS: 1.0000 | ORAL_TABLET | Freq: Four times a day (QID) | ORAL | Status: DC | PRN

## 2011-05-13 NOTE — Progress Notes (Signed)
Received refill request for Klonopin related to anxiety and Percocet relating to IBS.   Patient appears to have increasing need for narcotics for pain control of IBS with requests/refills now at less than 1 month. I will give patient 10  percocet but she needs to be seen again for further refills (with expectations for long term use of narcotics set and how long 10 or 30 percocets should last her) Other options for controlling IBS can be discussed at that time.   I will refill Klonopin as it appears she is chronically on this medication and not using at the full dose of 2x/day prn.

## 2011-05-15 ENCOUNTER — Telehealth: Payer: Self-pay | Admitting: Family Medicine

## 2011-05-15 ENCOUNTER — Encounter: Payer: Self-pay | Admitting: Family Medicine

## 2011-05-15 NOTE — Telephone Encounter (Signed)
Called in rx for klonopin to pharmacy. Was told that they received a fax for the klonopin and also for the percocet on 09/19/201. Patient will need hard copy  rx for percocet to take to pharmacy. Since we are unable to locate the RX Dr. Durene Cal wrote , Dr. Deirdre Priest rewrote the RX.  Patient advised that rx is only for 10 tabs and she will need appointment for further refills with PCP . Appointment scheduled with Dr. Konrad Dolores for 05/28/2011. She will come to pick up.

## 2011-05-15 NOTE — Telephone Encounter (Signed)
Checking on meds 

## 2011-05-15 NOTE — Telephone Encounter (Signed)
error 

## 2011-05-15 NOTE — Progress Notes (Signed)
Dr Durene Cal apparently prescribed #10 percocet and printed prescription but this can not be found.   Will hand write a prescription for #10 no refill.

## 2011-05-28 ENCOUNTER — Ambulatory Visit (INDEPENDENT_AMBULATORY_CARE_PROVIDER_SITE_OTHER): Payer: Self-pay | Admitting: Family Medicine

## 2011-05-28 ENCOUNTER — Encounter: Payer: Self-pay | Admitting: Family Medicine

## 2011-05-28 VITALS — BP 143/93 | HR 97 | Temp 98.1°F | Wt 179.0 lb

## 2011-05-28 DIAGNOSIS — F17200 Nicotine dependence, unspecified, uncomplicated: Secondary | ICD-10-CM

## 2011-05-28 DIAGNOSIS — F341 Dysthymic disorder: Secondary | ICD-10-CM

## 2011-05-28 DIAGNOSIS — I10 Essential (primary) hypertension: Secondary | ICD-10-CM

## 2011-05-28 DIAGNOSIS — K589 Irritable bowel syndrome without diarrhea: Secondary | ICD-10-CM

## 2011-05-28 MED ORDER — CLONAZEPAM 1 MG PO TABS: 1.0000 mg | ORAL_TABLET | Freq: Two times a day (BID) | ORAL | Status: DC | PRN

## 2011-05-28 MED ORDER — TRAMADOL HCL 50 MG PO TABS: 50.0000 mg | ORAL_TABLET | Freq: Two times a day (BID) | ORAL | Status: DC

## 2011-05-28 MED ORDER — OXYCODONE-ACETAMINOPHEN 7.5-325 MG PO TABS: 1.0000 | ORAL_TABLET | Freq: Four times a day (QID) | ORAL | Status: DC | PRN

## 2011-05-28 MED ORDER — AMITRIPTYLINE HCL 75 MG PO TABS: 150.0000 mg | ORAL_TABLET | Freq: Every day | ORAL | Status: DC

## 2011-05-28 MED ORDER — ACIDOPHILUS PROBIOTIC 100 MG PO CAPS: 1.0000 | ORAL_CAPSULE | Freq: Every day | ORAL | Status: AC

## 2011-05-28 NOTE — Progress Notes (Signed)
  Subjective:    Patient ID: Denise Webb, female    DOB: 23-Jul-1966, 45 y.o.   MRN: [id number]  HPI IBS: "attacks" have decereased to 1-2x wkly, down from 3-4 x wkly since increasing to the Bentyl 40mg  Q6. Still having ~5 loose BMs per day. Denies hematochezia. Lat colonoscopy in May 2010 was normal. Keeping track of foods and situations which cause attacks and is avoiding them.   Tobacco use: Still not interested in quitting smoking. Pt reports too many stressors right now to try and quit.  HTN: BP today is 143/93. Reports feeling anxious at time of BP check. Pt taking HCTZ as prescribed. W/o lightheadedness, syncope, CP, or SOB.    Review of Systems See HPI    Objective:   Physical Exam  Gen: NAD, WN. WD HEENT: MMM, no cervical lymphadenopathy CV: RRR, no m/r/g Resp: CTAB, normal effort Abd: NABS, nontender to palpation but tense Ext: normal ROM, 2+ distal pulses Neuro: CN grossly intact       Assessment & Plan:    Flu shot: pt refused flu shot today due to financial reasons and feeling poor for several weeks after her last flu shot several years ago.

## 2011-05-28 NOTE — Patient Instructions (Signed)
I'm glad your IBS is better controlled on the Bentyl 40mg . Please continue this regimen. As discussed I would also like to increase your amitriptyline to 150mg  a day as there is literature supporting this medication helping with IBS. I will also refill your percocet for 3 months with three different prescriptions. Please make a follow-up appointment to see me prior to running out of your medications. Please feel free to see me sooner if needed. I will also write a prescription for probiotics today for you.  Let me know when you are ready to stop smoking and we can discuss some things to help.  Have a great day.

## 2011-05-29 NOTE — Assessment & Plan Note (Addendum)
Continues to be fairly anxious. I think this may be compounding her IBS. I will increase her amitriptyline to 150mg  today and will refill her Klonopin today for next 3 months

## 2011-05-29 NOTE — Assessment & Plan Note (Signed)
Will address smoking cessation at next appointment

## 2011-05-29 NOTE — Assessment & Plan Note (Signed)
Doing well.  Pt stressed at time of BP check. Will consider increasing HCTZ at next appointment. No change at this time.

## 2011-05-29 NOTE — Assessment & Plan Note (Addendum)
Making good progress after increasing Bentyl to 40mg  QID. Will continue current regimen of Bentyl, metamucil, imodium, percocet and tramadol; and will add probiotics and will increase Amitriptyline to 150mg  QHS for additional relief. Of note I think there is an element to her IBS that is related to her anxiety and depression so the amitriptyline, which is being primarily used for anxiety and depression, should provide some added IBS benefit. I would like to see her dependence on opiods decrease w/ better control w/ other medications, and continued counseling on lifestyle and disease management.

## 2011-07-17 ENCOUNTER — Telehealth: Payer: Self-pay | Admitting: Family Medicine

## 2011-07-17 NOTE — Telephone Encounter (Signed)
Patient called emergency line stating that she is out of pain medication since yesterday (oxycodone) and is having a bout of IBS with abdominal pain and diarrhea. She is tolerating liquids, afebrile. I informed the patient that oxycodone cannot be called in and that the policy is not to refill pain medication over the phone. I advised patient that going to ED/UC for fluids and pain control is a reasonable option is symptoms worsen. I informed pt that I will route this note to her PCP and page him. Patient agreed with plan and voiced understanding.

## 2011-07-18 ENCOUNTER — Other Ambulatory Visit: Payer: Self-pay | Admitting: Family Medicine

## 2011-07-18 DIAGNOSIS — K589 Irritable bowel syndrome without diarrhea: Secondary | ICD-10-CM

## 2011-07-18 DIAGNOSIS — F341 Dysthymic disorder: Secondary | ICD-10-CM

## 2011-07-18 MED ORDER — TRAMADOL HCL 50 MG PO TABS: 50.0000 mg | ORAL_TABLET | Freq: Two times a day (BID) | ORAL | Status: DC

## 2011-07-18 MED ORDER — OXYCODONE-ACETAMINOPHEN 7.5-325 MG PO TABS: 1.0000 | ORAL_TABLET | Freq: Four times a day (QID) | ORAL | Status: DC | PRN

## 2011-07-18 NOTE — Progress Notes (Signed)
Addended by: Konrad Dolores, Mikiyah Glasner J on: 07/18/2011 08:04 PM   Modules accepted: Orders

## 2011-07-18 NOTE — Progress Notes (Signed)
Spoke with patient for approximately 10 minutes over current IBS flareup which is lasted for approximately 3-4 days. Have not see patient in clinic since 05/28/11 but very familiar the patient. Feel comfortable refilling Percocet and tramadol until patient's old visit in clinic. Heparin about prescriptions and will have them in clinic for patient to pick up on Monday.

## 2011-07-19 ENCOUNTER — Other Ambulatory Visit: Payer: Self-pay | Admitting: Family Medicine

## 2011-07-19 DIAGNOSIS — F341 Dysthymic disorder: Secondary | ICD-10-CM

## 2011-07-19 DIAGNOSIS — K589 Irritable bowel syndrome without diarrhea: Secondary | ICD-10-CM

## 2011-07-19 MED ORDER — TRAMADOL HCL 50 MG PO TABS: 50.0000 mg | ORAL_TABLET | Freq: Two times a day (BID) | ORAL | Status: DC

## 2011-07-19 MED ORDER — AMITRIPTYLINE HCL 75 MG PO TABS
150.0000 mg | ORAL_TABLET | Freq: Every day | ORAL | Status: DC
Start: 2011-07-19 — End: 2011-08-27

## 2011-07-19 NOTE — Progress Notes (Addendum)
CANCELED PRIOR ORDERS. PT SIGNED PAIN CONTRACT AND PRIOR SCRIPTS WRITTEN FOR FILL DATE ON 07/28/11. UNABLE TO CONTACT PT TODAY. OK w/ pt receiving additional amitriptyline script. Tried ordering but unable to print from the pediatric floor. Tried to call pharmacy to determine when filled.  Again - no Percocet, no tramadol, yes amitriptyline

## 2011-07-20 ENCOUNTER — Telehealth: Payer: Self-pay | Admitting: Family Medicine

## 2011-07-20 NOTE — Telephone Encounter (Signed)
Calling to see if pain medication rx is ready, advised MD will not be here until after 1:30, told her we will call her as soon as it is ready.

## 2011-07-20 NOTE — Telephone Encounter (Signed)
Denise Webb called to speak to Dr. Konrad Dolores, but he was already gone.  She asked if the Rx for the pain medicine was at the front, but it was not.

## 2011-07-21 NOTE — Telephone Encounter (Signed)
Calling again, explained to pt Dr Konrad Dolores wrote her last Rx on 11/4 and she is not due until 12/4, pt says when she spoke with the doctor over the weekend she explained she had to double up on the dosage b/c she had an IBS attack and that's why she needs it early, would like to know if MD is willing to do this?

## 2011-08-27 ENCOUNTER — Encounter: Payer: Self-pay | Admitting: Family Medicine

## 2011-08-27 ENCOUNTER — Ambulatory Visit (INDEPENDENT_AMBULATORY_CARE_PROVIDER_SITE_OTHER): Payer: Self-pay | Admitting: Family Medicine

## 2011-08-27 DIAGNOSIS — F17200 Nicotine dependence, unspecified, uncomplicated: Secondary | ICD-10-CM

## 2011-08-27 DIAGNOSIS — I10 Essential (primary) hypertension: Secondary | ICD-10-CM

## 2011-08-27 DIAGNOSIS — K589 Irritable bowel syndrome without diarrhea: Secondary | ICD-10-CM

## 2011-08-27 DIAGNOSIS — F341 Dysthymic disorder: Secondary | ICD-10-CM

## 2011-08-27 MED ORDER — LORAZEPAM 0.5 MG PO TABS: 0.5000 mg | ORAL_TABLET | Freq: Three times a day (TID) | ORAL | Status: DC | PRN

## 2011-08-27 MED ORDER — ACETAMINOPHEN 325 MG PO TABS: 975.0000 mg | ORAL_TABLET | Freq: Four times a day (QID) | ORAL | Status: AC | PRN

## 2011-08-27 MED ORDER — OXYCODONE HCL 10 MG PO TABS: 10.0000 mg | ORAL_TABLET | Freq: Four times a day (QID) | ORAL | Status: DC | PRN

## 2011-08-27 MED ORDER — AMITRIPTYLINE HCL 100 MG PO TABS: 200.0000 mg | ORAL_TABLET | Freq: Every day | ORAL | Status: DC

## 2011-08-27 NOTE — Progress Notes (Signed)
  Subjective:    Patient ID: Denise Webb, female    DOB: 28-Oct-1965, 46 y.o.   MRN: [id number]  HPI Chief complaint: Abdominal pain  IBS: Patient reports worsening of symptoms since last appointment with approximately 2-3 flares per month. Patient reports tramadol not working at all. Patient reports feelings of abdominal and hand swelling. Patient reports 4-8 loose movements per day. Due to increased frequency of IBS attacks, patient used to her Percocet prior to her last followup.  Patient reports taking Prilosec 20 mg daily over-the-counter.   Depression/Anxiety: Pt reports worsening of anxiety w/o relief of Klonopin. Very tearful today stating she feels more depressed due to her continued health problems.   Nausea well controlled w/ Phenergan   Review of Systems Denies: HA, CP, SOB, constipation, syncope    Objective:   Physical Exam  Res: CTAB CV: RRR, no m/r/g Abd: Lower abdominal pain on palpation. Hypoactive bowel sounds      Assessment & Plan:

## 2011-08-27 NOTE — Assessment & Plan Note (Signed)
BP much better today. No change to regimen at this time.

## 2011-08-27 NOTE — Patient Instructions (Signed)
Thank you for coming into clinic today. I am sorry that the pain from your IVS has gotten considerably worse lately. I have changed things around with your pain regimen as well as increasing her amitriptyline. I think this will help significantly. Please take all her medications as prescribed as included in this handout. Please come back to see me in one month for refills on his medications as I will need to see you in the office in order to give you any refills. Please come back sooner if needed for any other medical problems. Have a great day.

## 2011-08-27 NOTE — Assessment & Plan Note (Signed)
No willing to quit at this time. Will discuss at next appointment

## 2011-08-27 NOTE — Assessment & Plan Note (Signed)
DC Klonopin. Will start Ativan 0.5mg  Q8hrs PRN anxiety.

## 2011-08-27 NOTE — Assessment & Plan Note (Signed)
Pts pain regimen changed to the following. Oxy 10mg  Q6PRN x30 per month, DC tramadol, tylenol up to 1gm Q6PRN. Pt Amitryptiline increased to 200mg  QHS as pt likely to get added benefit for depressive symptoms. Pt instructed that no further increases will be made by myself for her pain. Pt to come back in 1 month to evaluate. No refills w/o f/u appointment after 1 month.   No sign of fluid retention from last appt. Pt weight up 1 lb from last appointment and pt wearing very heavy clothing today. No sign of fluid retention on PE.

## 2011-10-02 ENCOUNTER — Encounter: Payer: Self-pay | Admitting: Family Medicine

## 2011-10-02 ENCOUNTER — Ambulatory Visit (INDEPENDENT_AMBULATORY_CARE_PROVIDER_SITE_OTHER): Payer: Self-pay | Admitting: Family Medicine

## 2011-10-02 DIAGNOSIS — E781 Pure hyperglyceridemia: Secondary | ICD-10-CM

## 2011-10-02 DIAGNOSIS — K589 Irritable bowel syndrome without diarrhea: Secondary | ICD-10-CM

## 2011-10-02 DIAGNOSIS — M79671 Pain in right foot: Secondary | ICD-10-CM | POA: Insufficient documentation

## 2011-10-02 DIAGNOSIS — F17200 Nicotine dependence, unspecified, uncomplicated: Secondary | ICD-10-CM

## 2011-10-02 DIAGNOSIS — F341 Dysthymic disorder: Secondary | ICD-10-CM

## 2011-10-02 DIAGNOSIS — M79672 Pain in left foot: Secondary | ICD-10-CM | POA: Insufficient documentation

## 2011-10-02 DIAGNOSIS — M79609 Pain in unspecified limb: Secondary | ICD-10-CM

## 2011-10-02 DIAGNOSIS — I10 Essential (primary) hypertension: Secondary | ICD-10-CM

## 2011-10-02 DIAGNOSIS — R51 Headache: Secondary | ICD-10-CM

## 2011-10-02 MED ORDER — OXYCODONE HCL 10 MG PO TABS: 10.0000 mg | ORAL_TABLET | Freq: Four times a day (QID) | ORAL | Status: DC | PRN

## 2011-10-02 MED ORDER — HYDROCHLOROTHIAZIDE 12.5 MG PO CAPS: 12.5000 mg | ORAL_CAPSULE | Freq: Every day | ORAL | Status: DC

## 2011-10-02 MED ORDER — LORAZEPAM 0.5 MG PO TABS: 0.5000 mg | ORAL_TABLET | Freq: Three times a day (TID) | ORAL | Status: DC | PRN

## 2011-10-02 NOTE — Patient Instructions (Signed)
Thank you for coming in today  I have refilled your blood pressure medication. Please take this every day  I am happy with the progress that you are making with your IBS. Please come back to see me in 3 months or sooner if needed. I've refilled your pain medicaitons for that amount of time.   Please continue using ice, NSAIDs, and exercises for your foot pain. Please also try purchasing different shoes with better suppport. Please go to [facility] to get x-rays of your feet at your earliest convenience. I will call you if anything comes back abnormal.   Please follow up with a dentist for your jaw pain.

## 2011-10-03 ENCOUNTER — Encounter: Payer: Self-pay | Admitting: Family Medicine

## 2011-10-03 NOTE — Progress Notes (Signed)
Called Pharmacy to confirm filling of prescriptions. Pharmacist confirmed the following fill dates:  Oxydocone 30   (30 day supply) 10/02/11 Ativan 30  (30 day supply) 10/02/11 Amitriptyline 60  (30 day supply) 10/12 Bentyl 120  (30 day supply) 12/12 HCTZ 30  (30 day supply) 12/12 Lactobacillus 30 (30 day supply) 10/12 Phenergan 90     8/12 Prilosec     OTC Metamucil     OTC  Fill information from pharmacy not consistent w/ pt reports. Will address at next appointment. Pt scheduled to return in 3 months. Will call pharmacy again prior to visit to receive updated numbers.
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
## 2011-10-03 NOTE — Assessment & Plan Note (Signed)
BP noted as above. Pt w/o BP meds for 1 wk. Refilled Rx today. Pt reminded to notify pharmacy well in advance of running out of Rx in the future.

## 2011-10-03 NOTE — Assessment & Plan Note (Signed)
1/2ppd is an improvement. Pt w/ too many stressors to try to cut back further at this time. Will address again at next appt.

## 2011-10-03 NOTE — Assessment & Plan Note (Signed)
Last lipid study in 2010. Noted after pt left. Will check direct LDL at next appt.

## 2011-10-03 NOTE — Assessment & Plan Note (Signed)
Cont. Ativan 0.5 PRN

## 2011-10-03 NOTE — Assessment & Plan Note (Signed)
No residual soft tissue damage based on exam. Pain on paplation of bony structures and h/o change in pts bite is concerning for maxillary fracture. Will refer pt to dentist for imaging and further intervention.

## 2011-10-03 NOTE — Assessment & Plan Note (Signed)
Improved since changing regimen. No change at this time. Refilled Rx for Oxy for 3 mo. No refills until 12/30/11. Continue current regimen

## 2011-10-03 NOTE — Assessment & Plan Note (Signed)
Hx and PE not consistent w/ heal spurs / plantar fascitis. Pt w/ h/o foot injuries. Possible neuropathy vs other bony malformation. X-ray for further workup. No other neuropathic pain, and no h/o DM or vitamin deficiency. Will check for vit deficiency at next appt. If pain persists and x-ray inconclusive. For now pt to continue NSAID's and soaking PRN

## 2011-11-30 ENCOUNTER — Other Ambulatory Visit: Payer: Self-pay | Admitting: *Deleted

## 2011-11-30 DIAGNOSIS — F341 Dysthymic disorder: Secondary | ICD-10-CM

## 2011-12-17 ENCOUNTER — Encounter: Payer: Self-pay | Admitting: Family Medicine

## 2011-12-17 ENCOUNTER — Ambulatory Visit (INDEPENDENT_AMBULATORY_CARE_PROVIDER_SITE_OTHER): Payer: Self-pay | Admitting: Family Medicine

## 2011-12-17 ENCOUNTER — Other Ambulatory Visit (HOSPITAL_COMMUNITY)
Admission: RE | Admit: 2011-12-17 | Discharge: 2011-12-17 | Disposition: A | Discharge status: Home / Self Care | Payer: Self-pay | Source: Ambulatory Visit | Attending: Family Medicine | Admitting: Family Medicine

## 2011-12-17 VITALS — BP 125/88 | HR 102 | Ht 68.5 in | Wt 174.6 lb

## 2011-12-17 DIAGNOSIS — Z113 Encounter for screening for infections with a predominantly sexual mode of transmission: Secondary | ICD-10-CM | POA: Insufficient documentation

## 2011-12-17 DIAGNOSIS — N760 Acute vaginitis: Secondary | ICD-10-CM

## 2011-12-17 DIAGNOSIS — N946 Dysmenorrhea, unspecified: Secondary | ICD-10-CM

## 2011-12-17 LAB — POCT HEMOGLOBIN: Hemoglobin: 12.1 g/dL — AB (ref 12.2–16.2)

## 2011-12-17 LAB — POCT WET PREP (WET MOUNT)

## 2011-12-17 MED ORDER — NAPROXEN 500 MG PO TABS: 500.0000 mg | ORAL_TABLET | Freq: Two times a day (BID) | ORAL | Status: DC

## 2011-12-17 NOTE — Progress Notes (Signed)
  Subjective:    Patient ID: Denise Webb, female    DOB: May 07, 1966, 46 y.o.   MRN: [id number]  HPI Here for evaluation of menstrual pain.  Crampy lower abdominal pain with menses.  For past several days associated with heavy menstrual bleeding- changing tampons hourly.  Notes did not have a period for the past 3 months.    Has history of dysmenorrhea- in past ha been treated by OB-YN, now all chronic pain being treated by family doctor.  I have reviewed patient's  PMH, FH, and Social history and Medications as related to this visit. Sig for chronic back pain, on narcotics, history of dysmenorrhea, IBS.  States pain feels more like mensrua pain with tender breasts, not like her IBS pain.  Review of Systems No fever, nausea, emesis, diarrhea, dysuria or vaginal discharge.    Objective:   Physical Exam GEN: Alert & Oriented CV:  Regular Rate & Rhythm, no murmur Respiratory:  Normal work of breathing, CTAB Abd:  + BS, soft, TTP over entire abdomen.  NO rebound or guarding. Pelvic Exam:        External: normal female genitalia without lesions or masses        Vagina: normal without lesions or masses        Cervix: normal without lesions or masses        Adnexa: normal bimanual exam without masses or fullness        Uterus: normal by palpation        Samples for Wet prep, GC/Chlamydia obtained        Assessment & Plan:

## 2011-12-17 NOTE — Patient Instructions (Addendum)
I will let you know if any of your tests show an infection  Keep your follow-up appointment with Dr. Konrad Dolores  Dysmenorrhea Dysmenorrhea is pain during a menstrual period. The pain is caused by the tightening (contracting) of the muscles of the uterus. Headache, feeling sick to your stomach (nausea), throwing up (vomiting), or low back pain may occur with this condition. HOME CARE  Only take medicine as told by your doctor.   Place a heating pad or hot water bottle on your lower back or belly (abdomen). Do not sleep with a heating pad.   Exercise may help lessen the pain.   Massage the lower back or belly.   Stop smoking.   Avoid alcohol or caffeine.   Try yoga or acupuncture.  GET HELP RIGHT AWAY IF:    Your pain does not get better with medicine.   Your pain gets worse while taking pain medicine.   You have a fever.   You keep feeling sick to your stomach or keep throwing up.   Your pain moves to your upper belly.   Your period bleeding is heavier than normal.   You pass out (faint).  MAKE SURE YOU:  Understand these instructions.   Will watch your condition.   Will get help right away if you are not doing well or get worse.  Document Released: 11/06/2008 Document Revised: 07/30/2011 Document Reviewed: 12/16/2009 [facility] Patient Information 2012 [place], [place].

## 2011-12-17 NOTE — Assessment & Plan Note (Addendum)
Long history of dysmenorrhea and chronic pain.  Will treat with naprosyn 500 bid for bleeding and pain.  Patient may continue oxycodone for chronic pain.  No evidence of PID or endometritis on exam  cervical cultures obtained, upreg neg, and hemoglobin normal.  To follow-up with pcp in next 1-2 weeks for continued management of chronic pain.

## 2011-12-18 ENCOUNTER — Encounter: Payer: Self-pay | Admitting: Family Medicine

## 2011-12-30 ENCOUNTER — Ambulatory Visit (INDEPENDENT_AMBULATORY_CARE_PROVIDER_SITE_OTHER): Payer: Self-pay | Admitting: Family Medicine

## 2011-12-30 VITALS — BP 158/104 | HR 117 | Temp 98.3°F | Ht 68.5 in | Wt 167.2 lb

## 2011-12-30 DIAGNOSIS — F341 Dysthymic disorder: Secondary | ICD-10-CM

## 2011-12-30 DIAGNOSIS — K589 Irritable bowel syndrome without diarrhea: Secondary | ICD-10-CM

## 2011-12-30 DIAGNOSIS — M79671 Pain in right foot: Secondary | ICD-10-CM

## 2011-12-30 DIAGNOSIS — I10 Essential (primary) hypertension: Secondary | ICD-10-CM

## 2011-12-30 DIAGNOSIS — M79609 Pain in unspecified limb: Secondary | ICD-10-CM

## 2011-12-30 MED ORDER — DICYCLOMINE HCL 20 MG PO TABS: 40.0000 mg | ORAL_TABLET | Freq: Four times a day (QID) | ORAL | Status: DC

## 2011-12-30 MED ORDER — HYDROCHLOROTHIAZIDE 12.5 MG PO CAPS: 12.5000 mg | ORAL_CAPSULE | Freq: Every day | ORAL | Status: DC

## 2011-12-30 MED ORDER — ALPRAZOLAM 0.25 MG PO TABS: 0.2500 mg | ORAL_TABLET | Freq: Two times a day (BID) | ORAL | Status: DC | PRN

## 2011-12-30 MED ORDER — OXYCODONE HCL 5 MG PO TABS: 5.0000 mg | ORAL_TABLET | ORAL | Status: DC | PRN

## 2011-12-30 NOTE — Patient Instructions (Signed)
Thanks for coming in. I've refilled your medications enough to make it to next week for an appointment. Please come in with all of your medications so we can determine a plan for appropriate therapy moving forward. Sorry for the inconvenience. Please call if you have any further questions.

## 2012-01-02 NOTE — Assessment & Plan Note (Signed)
Will change from ativan to xanax (0.25mg ). Limited supply as pt to see me in 1wk.

## 2012-01-02 NOTE — Assessment & Plan Note (Signed)
Pt did not get xrays as ordered. No complaints today. Resolved. Pt off an on w/ vague pain complaints which tend to resolve on their own. Likely psychosomatic component

## 2012-01-02 NOTE — Assessment & Plan Note (Addendum)
Pt has been dishonest w/ regards to her IBS therapy. Pt given reduced pain regimen (oxy 5mg  instead of 10mg ) and told to return in 1 wk w/ ALL medications. Pill count and new Rx will be written at that time. Pt will be required to subject herself to more close monitoring. Will contact pharmacy next week to confirm prescriptions filled. Pt amenable to plan. If pt has problems in the future will need to refer to pain clinic or discussed having the pt see another provider outside of our practice.

## 2012-01-02 NOTE — Progress Notes (Signed)
  Subjective:    Patient ID: Denise Webb, female    DOB: 08-06-1966, 46 y.o.   MRN: [id number]  HPI  CC: IBS  IBS: Pt w/ chronic IBS diarrhea type complaints. States her flares are increasing in frequency, 3 flares every 2-2.5wks. States that she has been taking all medications w/o benefit. Pt comfronted concerning discrepencies in her history and fill record from the pharmacy. Pt stated that she wasn't sure which medications to take when. Denies wt loss, fever, hematochezia, nausea.  Vaginal bleeding: Pt no longer w/ vaginal bleeding. Assessed recently for abnormal bleeding. Stating she was using several tampons every day for several days. Hgb reviewed w/ pt and was noted to be normal.   HTN: Pt w/o HCTZ for the past 2 wks. State's she "just didn't fill it". Denies HA, Syncope, Lightheadedness, palpitations  Anxiety: Pt reports previous use of Xanax (3 yrs ago) w/ great relief. Reports increased anxiety lately and Ativan has made pt not feel like herself. Has also tried klonopin w/o much benefit.   Review of Systems Per HPI    Objective:   Physical Exam  Const: well nourished, non-distressed CV: RRR Res: Normal effort Abd: Mildly tender to palpation      Assessment & Plan:

## 2012-01-06 ENCOUNTER — Ambulatory Visit (INDEPENDENT_AMBULATORY_CARE_PROVIDER_SITE_OTHER): Payer: Self-pay | Admitting: Family Medicine

## 2012-01-06 ENCOUNTER — Encounter: Payer: Self-pay | Admitting: Family Medicine

## 2012-01-06 VITALS — BP 135/84 | HR 105 | Temp 98.5°F | Ht 68.5 in | Wt 171.0 lb

## 2012-01-06 DIAGNOSIS — F341 Dysthymic disorder: Secondary | ICD-10-CM

## 2012-01-06 DIAGNOSIS — K1379 Other lesions of oral mucosa: Secondary | ICD-10-CM

## 2012-01-06 DIAGNOSIS — K589 Irritable bowel syndrome without diarrhea: Secondary | ICD-10-CM

## 2012-01-06 DIAGNOSIS — I10 Essential (primary) hypertension: Secondary | ICD-10-CM

## 2012-01-06 DIAGNOSIS — K1370 Unspecified lesions of oral mucosa: Secondary | ICD-10-CM

## 2012-01-06 MED ORDER — ALPRAZOLAM 0.25 MG PO TABS: 0.2500 mg | ORAL_TABLET | Freq: Two times a day (BID) | ORAL | Status: AC | PRN

## 2012-01-06 MED ORDER — OXYCODONE HCL 5 MG PO TABS: 5.0000 mg | ORAL_TABLET | ORAL | Status: DC | PRN

## 2012-01-06 NOTE — Patient Instructions (Signed)
Please come back and see me in 3 weeks to discuss your anxiety and IBS. You can make it.

## 2012-01-13 MED ORDER — BENZOCAINE 20 % MT SOLN: 1.0000 "application " | Freq: Two times a day (BID) | OROMUCOSAL | Status: DC | PRN

## 2012-01-13 NOTE — Progress Notes (Signed)
  Subjective:    Patient ID: Denise Webb, female    DOB: [birth year], 46 y.o.   MRN: [id number]  HPI  CC: Pain medicaiton refill, painful mouth   Irritable bowel syndrome: Patient's pain and anxiety medications recently decreased at last appointment. Patient reports being able to manage on current pain regimen this am requesting more medication. Patient reports compliance with other therapies at this time. Patient denies hematochezia, weight loss, headache, nausea, vomiting,. Vital signs reviewed, and the weight of 171 pounds near patient's baseline. Patient continues to report multiple "attacks "which are debilitating, per the patient. As per our last appointment patient brought in all of her medications for review and a plan was agreed upon for prescribing medications and to have patient return in 3 weeks.  Mouth pain: Patient reports 3 day history of painful mouth making it difficult to swallow. Patient has tried peroxide rinse without relief and Chloraseptic spray with only mild relief. Patient reports her entire mouth hurts. Denies hemoptysis. Denies previous event such as this. Denies history of cold sores. Denies drug use, or chewing tobacco.  Anxiety: Patient reports previous Xanax not being adequate to treat her anxiety. Patient reports concern over having panic attack. Of note patient was without difficulty and events over the past week, and patient was advised to use urgent care or emergency room if truly having a panic attack. I think this will be unlikely given patient's history. Patient was unable to identify triggers or situations in which panic attacks occur but just expressed concern that she might have one. Patient states Xanax is the best medication to cover her panic attacks. Patient has tried numerous other medications including other benzos, Atarax (side effects dizzy, equilibrium problems), benadryl. Patient has never been placed on beta blocker.   Of note patient very well  versed in all different pill sizes, colors, and dosing of various benzos. Denies palpitations, headache, lightheadedness, syncope, chest pain, shortness of breath.   Review of Systems See history of present illness    Objective:   Physical Exam  GEN: No acute distress, well-nourished, well-developed CV: Regular rate and rhythm, no murmurs rubs or gallops, Abdominal: Normal active bowel sounds, mildly painful to palpation. No focal tenderness HEENT: No oral pharyngeal lesions noted, patient's speech normal. The cervical lymphadenopathy.    Assessment & Plan:

## 2012-01-14 DIAGNOSIS — K1379 Other lesions of oral mucosa: Secondary | ICD-10-CM | POA: Insufficient documentation

## 2012-01-14 NOTE — Assessment & Plan Note (Signed)
Refill patient's Xanax. Patient with enough for 3 weeks. Will reassess at that time. Will consider beta blocker or other intervention in place of increasing Xanax at next appointment.

## 2012-01-14 NOTE — Assessment & Plan Note (Signed)
No active lesions. Etiology unknown. Patient still able to eat. Likely psychosomatic, as patient has various pain complaints at previous appointments for which she never seeks further diagnostic work and pain typically resolves. Hurricaine spray. Patient to return or call if symptoms worsen.

## 2012-01-14 NOTE — Assessment & Plan Note (Signed)
Vital signs as above. Patient currently compliant with therapy. No further intervention at this time.

## 2012-01-14 NOTE — Assessment & Plan Note (Signed)
Pills counted during appointment. Amitriptyline 28, HCTZ 21, Bentyl numerous, acidophilus numerous, Imodium numerous. Patient with enough medications for 3 weeks. Patient to return in 3 weeks at which time all medications will be prescribed. Pharmacy notified and onboard to discontinue all previous prescriptions and per place with new prescriptions allowing regular tracking until counting. Patient to receive Imodium as prescription as pharmacy states no difference in price compared to over-the-counter. Will not increase oxycodone dose at this time. Patient will likely need to seek pain management through another facility if she is not amenable with plan at next appointment.

## 2012-02-09 ENCOUNTER — Encounter (HOSPITAL_COMMUNITY): Payer: Self-pay | Admitting: *Deleted

## 2012-02-09 ENCOUNTER — Emergency Department (HOSPITAL_COMMUNITY)
Admission: EM | Admit: 2012-02-09 | Discharge: 2012-02-09 | Disposition: A | Discharge status: Home / Self Care | Payer: Self-pay | Attending: Emergency Medicine | Admitting: Emergency Medicine

## 2012-02-09 DIAGNOSIS — K625 Hemorrhage of anus and rectum: Secondary | ICD-10-CM | POA: Insufficient documentation

## 2012-02-09 DIAGNOSIS — K589 Irritable bowel syndrome without diarrhea: Secondary | ICD-10-CM | POA: Insufficient documentation

## 2012-02-09 DIAGNOSIS — F17200 Nicotine dependence, unspecified, uncomplicated: Secondary | ICD-10-CM | POA: Insufficient documentation

## 2012-02-09 DIAGNOSIS — Z79899 Other long term (current) drug therapy: Secondary | ICD-10-CM | POA: Insufficient documentation

## 2012-02-09 HISTORY — DX: Plantar fascial fibromatosis: M72.2

## 2012-02-09 HISTORY — DX: Irritable bowel syndrome, unspecified: K58.9

## 2012-02-09 LAB — DIFFERENTIAL
Basophils Relative: 0 % (ref 0–1)
Lymphocytes Relative: 29 % (ref 12–46)
Lymphs Abs: 1.9 10*3/uL (ref 0.7–4.0)
Monocytes Absolute: 0.4 10*3/uL (ref 0.1–1.0)
Monocytes Relative: 5 % (ref 3–12)
Neutro Abs: 4.3 10*3/uL (ref 1.7–7.7)
Neutrophils Relative %: 65 % (ref 43–77)

## 2012-02-09 LAB — CBC
HCT: 38.4 % (ref 36.0–46.0)
Hemoglobin: 13 g/dL (ref 12.0–15.0)
RBC: 3.85 MIL/uL — ABNORMAL LOW (ref 3.87–5.11)
WBC: 6.7 10*3/uL (ref 4.0–10.5)

## 2012-02-09 LAB — URINALYSIS, ROUTINE W REFLEX MICROSCOPIC
Glucose, UA: NEGATIVE mg/dL
Ketones, ur: NEGATIVE mg/dL
Leukocytes, UA: NEGATIVE
Protein, ur: NEGATIVE mg/dL
Urobilinogen, UA: 0.2 mg/dL (ref 0.0–1.0)

## 2012-02-09 LAB — COMPREHENSIVE METABOLIC PANEL
Albumin: 3.7 g/dL (ref 3.5–5.2)
Alkaline Phosphatase: 69 U/L (ref 39–117)
BUN: 4 mg/dL — ABNORMAL LOW (ref 6–23)
CO2: 26 mEq/L (ref 19–32)
Chloride: 103 mEq/L (ref 96–112)
Creatinine, Ser: 0.53 mg/dL (ref 0.50–1.10)
GFR calc non Af Amer: >90 mL/min (ref >90)
Glucose, Bld: 111 mg/dL — ABNORMAL HIGH (ref 70–99)
Potassium: 4 mEq/L (ref 3.5–5.1)
Total Bilirubin: 0.1 mg/dL — ABNORMAL LOW (ref 0.3–1.2)

## 2012-02-09 MED ORDER — ONDANSETRON HCL 4 MG/2ML IJ SOLN
4.0000 mg | Freq: Once | INTRAMUSCULAR | Status: AC
  Administered 2012-02-09: 4 mg via INTRAVENOUS
  Filled 2012-02-09: qty 2

## 2012-02-09 MED ORDER — HYDROMORPHONE HCL PF 1 MG/ML IJ SOLN
1.0000 mg | Freq: Once | INTRAMUSCULAR | Status: AC
  Administered 2012-02-09: 1 mg via INTRAVENOUS
  Filled 2012-02-09: qty 1

## 2012-02-09 MED ORDER — SODIUM CHLORIDE 0.9 % IV BOLUS (SEPSIS)
1000.0000 mL | Freq: Once | INTRAVENOUS | Status: AC
  Administered 2012-02-09: 1000 mL via INTRAVENOUS

## 2012-02-09 NOTE — Discharge Instructions (Signed)
Irritable Bowel Syndrome Irritable Bowel Syndrome (IBS) is caused by a disturbance of normal bowel function. Other terms used are spastic colon, mucous colitis, and irritable colon. It does not require surgery, nor does it lead to cancer. There is no cure for IBS. But with proper diet, stress reduction, and medication, you will find that your problems (symptoms) will gradually disappear or improve. IBS is a common digestive disorder. It usually appears in late adolescence or early adulthood. Women develop it twice as often as men. CAUSES   After food has been digested and absorbed in the small intestine, waste material is moved into the colon (large intestine). In the colon, water and salts are absorbed from the undigested products coming from the small intestine. The remaining residue, or fecal material, is held for elimination. Under normal circumstances, gentle, rhythmic contractions on the bowel walls push the fecal material along the colon towards the rectum. In IBS, however, these contractions are irregular and poorly coordinated. The fecal material is either retained too long, resulting in constipation, or expelled too soon, producing diarrhea. SYMPTOMS   The most common symptom of IBS is pain. It is typically in the lower left side of the belly (abdomen). But it may occur anywhere in the abdomen. It can be felt as heartburn, backache, or even as a dull pain in the arms or shoulders. The pain comes from excessive bowel-muscle spasms and from the buildup of gas and fecal material in the colon. This pain:  Can range from sharp belly (abdominal) cramps to a dull, continuous ache.   Usually worsens soon after eating.   Is typically relieved by having a bowel movement or passing gas.  Abdominal pain is usually accompanied by constipation. But it may also produce diarrhea. The diarrhea typically occurs right after a meal or upon arising in the morning. The stools are typically soft and watery. They are  often flecked with secretions (mucus). Other symptoms of IBS include:  Bloating.   Loss of appetite.   Heartburn.   Feeling sick to your stomach (nausea).   Belching   Vomiting   Gas.  IBS may also cause a number of symptoms that are unrelated to the digestive system:  Fatigue.   Headaches.   Anxiety   Shortness of breath   Difficulty in concentrating.   Dizziness.  These symptoms tend to come and go. DIAGNOSIS   The symptoms of IBS closely mimic the symptoms of other, more serious digestive disorders. So your caregiver may wish to perform a variety of additional tests to exclude these disorders. He/she wants to be certain of learning what is wrong (diagnosis). The nature and purpose of each test will be explained to you. TREATMENT A number of medications are available to help correct bowel function and/or relieve bowel spasms and abdominal pain. Among the drugs available are:  Mild, non-irritating laxatives for severe constipation and to help restore normal bowel habits.   Specific anti-diarrheal medications to treat severe or prolonged diarrhea.   Anti-spasmodic agents to relieve intestinal cramps.   Your caregiver may also decide to treat you with a mild tranquilizer or sedative during unusually stressful periods in your life.  The important thing to remember is that if any drug is prescribed for you, make sure that you take it exactly as directed. Make sure that your caregiver knows how well it worked for you. HOME CARE INSTRUCTIONS    Avoid foods that are high in fat or oils. Some examples are:heavy cream, butter, frankfurters,   sausage, and other fatty meats.   Avoid foods that have a laxative effect, such as fruit, fruit juice, and dairy products.   Cut out carbonated drinks, chewing gum, and "gassy" foods, such as beans and cabbage. This may help relieve bloating and belching.   Bran taken with plenty of liquids may help relieve constipation.   Keep track  of what foods seem to trigger your symptoms.   Avoid emotionally charged situations or circumstances that produce anxiety.   Start or continue exercising.   Get plenty of rest and sleep.  MAKE SURE YOU:    Understand these instructions.   Will watch your condition.   Will get help right away if you are not doing well or get worse.  Document Released: 08/10/2005 Document Revised: 07/30/2011 Document Reviewed: 03/30/2008 [employer] Patient Information 2012 [employer]. 

## 2012-02-09 NOTE — ED Notes (Signed)
Patient aware of need for urine specimen. Patient unable to void at this time. Patient given label specimen cup. Encouraged to call for assistance if needed.   

## 2012-02-09 NOTE — ED Notes (Signed)
Pt reports Hx IBS. Diarrhea x1 week, noticed bright red blood in stool yesterday. Bloating in abd.

## 2012-02-09 NOTE — ED Provider Notes (Signed)
History     CSN: [id number]  Arrival date & time 02/09/12  1354   First MD Initiated Contact with Patient 02/09/12 1543      Chief Complaint  Patient presents with  . Irritable Bowel Syndrome  . Rectal Bleeding    (Consider location/radiation/quality/duration/timing/severity/associated sxs/prior treatment) HPI Comments: Pt has a hx of IBS.  Is treated by Dr. Margot Ables at [facility] clinic.  Takes chronic pain meds for this as well as lomotil, bentyl, acidophilus, and elavil.  Says that she occasionally has flare-ups of her IBS.  Complains of abd cramping, loose stools and some intermittent blood in her stool.  No dizziness.  Feels weak all over.  Some nausea, but no vomiting.  No fever.  Says this is the same as her past flare-ups.  Patient is a 46 y.o. female presenting with hematochezia. The history is provided by the patient.  Rectal Bleeding  Associated symptoms include abdominal pain, diarrhea and nausea. Pertinent negatives include no fever, no vomiting, no hematuria, no chest pain, no headaches, no coughing and no rash.    Past Medical History  Diagnosis Date  . Hypertension   . IBS (irritable bowel syndrome)   . Arthritis   . Plantar fasciitis     Past Surgical History  Procedure Date  . Tubal ligation   . Cesarean section     No family history on file.  History  Substance Use Topics  . Smoking status: Current Everyday Smoker -- 0.5 packs/day    Types: Cigarettes  . Smokeless tobacco: Never Used  . Alcohol Use: Yes     occasionally    OB History    Grav Para Term Preterm Abortions TAB SAB Ect Mult Living                  Review of Systems  Constitutional: Positive for fatigue. Negative for fever, chills and diaphoresis.  HENT: Negative for congestion, rhinorrhea and sneezing.   Eyes: Negative.   Respiratory: Negative for cough, chest tightness and shortness of breath.   Cardiovascular: Negative for chest pain and leg swelling.  Gastrointestinal: Positive for  nausea, abdominal pain, diarrhea, blood in stool, hematochezia and abdominal distention. Negative for vomiting.  Genitourinary: Negative for frequency, hematuria, flank pain and difficulty urinating.  Musculoskeletal: Negative for back pain and arthralgias.  Skin: Negative for rash.  Neurological: Negative for dizziness, speech difficulty, weakness, numbness and headaches.    Allergies  Erythromycin stearate  Home Medications   Current Outpatient Rx  Name Route Sig Dispense Refill  . AMITRIPTYLINE HCL 100 MG PO TABS Oral Take 2 tablets (200 mg total) by mouth at bedtime. 60 tablet 3  . DICYCLOMINE HCL 20 MG PO TABS Oral Take 40 mg by mouth every 4 (four) hours.    Marland Kitchen HYDROCHLOROTHIAZIDE 12.5 MG PO CAPS Oral Take 1 capsule (12.5 mg total) by mouth daily. 30 capsule 3  . ACIDOPHILUS PROBIOTIC 100 MG PO CAPS Oral Take 1 capsule (100 mg total) by mouth daily. 30 capsule 3  . LOPERAMIDE HCL 2 MG PO TABS Oral Take 1 tablet (2 mg total) by mouth 3 (three) times daily as needed for diarrhea/loose stools. Take 2 tabs by mouth once daily as needed for diarrhea. 90 tablet 3  . OXYCODONE HCL ER 10 MG PO TB12 Oral Take 10 mg by mouth daily. Pt states she takes one only PRN      BP 143/93  Pulse 95  Temp 97.5 F (36.4 C) (Oral)  Resp 20  SpO2 100%  Physical Exam  Constitutional: She is oriented to person, place, and time. She appears well-developed and well-nourished.  HENT:  Head: Normocephalic and atraumatic.  Eyes: Conjunctivae are normal. Pupils are equal, round, and reactive to light.  Neck: Normal range of motion. Neck supple.  Cardiovascular: Normal rate, regular rhythm and normal heart sounds.   Pulmonary/Chest: Effort normal and breath sounds normal. No respiratory distress. She has no wheezes. She has no rales. She exhibits no tenderness.  Abdominal: Soft. Bowel sounds are normal. There is no tenderness. There is no rebound and no guarding.  Musculoskeletal: Normal range of motion.  She exhibits no edema.  Lymphadenopathy:    She has no cervical adenopathy.  Neurological: She is alert and oriented to person, place, and time.  Skin: Skin is warm and dry. No rash noted.  Psychiatric: She has a normal mood and affect.    ED Course  Procedures (including critical care time)  Results for orders placed during the hospital encounter of 02/09/12  CBC      Component Value Range   WBC 6.7  4.0 - 10.5 K/uL   RBC 3.85 (*) 3.87 - 5.11 MIL/uL   Hemoglobin 13.0  12.0 - 15.0 g/dL   HCT 16.1  09.6 - 04.5 %   MCV 99.7  78.0 - 100.0 fL   MCH 33.8  26.0 - 34.0 pg   MCHC 33.9  30.0 - 36.0 g/dL   RDW 40.9  81.1 - 91.4 %   Platelets 289  150 - 400 K/uL  DIFFERENTIAL      Component Value Range   Neutrophils Relative 65  43 - 77 %   Neutro Abs 4.3  1.7 - 7.7 K/uL   Lymphocytes Relative 29  12 - 46 %   Lymphs Abs 1.9  0.7 - 4.0 K/uL   Monocytes Relative 5  3 - 12 %   Monocytes Absolute 0.4  0.1 - 1.0 K/uL   Eosinophils Relative 1  0 - 5 %   Eosinophils Absolute 0.1  0.0 - 0.7 K/uL   Basophils Relative 0  0 - 1 %   Basophils Absolute 0.0  0.0 - 0.1 K/uL  COMPREHENSIVE METABOLIC PANEL      Component Value Range   Sodium 138  135 - 145 mEq/L   Potassium 4.0  3.5 - 5.1 mEq/L   Chloride 103  96 - 112 mEq/L   CO2 26  19 - 32 mEq/L   Glucose, Bld 111 (*) 70 - 99 mg/dL   BUN 4 (*) 6 - 23 mg/dL   Creatinine, Ser 7.82  0.50 - 1.10 mg/dL   Calcium 9.3  8.4 - 95.6 mg/dL   Total Protein 7.4  6.0 - 8.3 g/dL   Albumin 3.7  3.5 - 5.2 g/dL   AST 19  0 - 37 U/L   ALT 15  0 - 35 U/L   Alkaline Phosphatase 69  39 - 117 U/L   Total Bilirubin 0.1 (*) 0.3 - 1.2 mg/dL   GFR calc non Af Amer >90  >90 mL/min   GFR calc Af Amer >90  >90 mL/min  URINALYSIS, ROUTINE W REFLEX MICROSCOPIC      Component Value Range   Color, Urine YELLOW  YELLOW   APPearance CLEAR  CLEAR   Specific Gravity, Urine 1.006  1.005 - 1.030   pH 7.5  5.0 - 8.0   Glucose, UA NEGATIVE  NEGATIVE mg/dL   Hgb urine  dipstick NEGATIVE  NEGATIVE   Bilirubin Urine NEGATIVE  NEGATIVE   Ketones, ur NEGATIVE  NEGATIVE mg/dL   Protein, ur NEGATIVE  NEGATIVE mg/dL   Urobilinogen, UA 0.2  0.0 - 1.0 mg/dL   Nitrite NEGATIVE  NEGATIVE   Leukocytes, UA NEGATIVE  NEGATIVE  PREGNANCY, URINE      Component Value Range   Preg Test, Ur NEGATIVE  NEGATIVE   No results found.  No results found.   1. IBS (irritable bowel syndrome)       MDM  Pt with symptoms consistent with her past IBS flare-ups.  Blood work normal.  abd exam benign, pt sitting up in bed, smiling, appears very comfortable.  Will d/c to f/u with [facility] clinic        Rolan Bucco, MD 02/10/12 [phone]

## 2012-02-11 ENCOUNTER — Encounter: Payer: Self-pay | Admitting: Family Medicine

## 2012-02-11 ENCOUNTER — Ambulatory Visit (INDEPENDENT_AMBULATORY_CARE_PROVIDER_SITE_OTHER): Payer: Self-pay | Admitting: Family Medicine

## 2012-02-11 VITALS — BP 143/93 | HR 105 | Ht 68.5 in | Wt 162.0 lb

## 2012-02-11 DIAGNOSIS — K589 Irritable bowel syndrome without diarrhea: Secondary | ICD-10-CM

## 2012-02-11 DIAGNOSIS — F341 Dysthymic disorder: Secondary | ICD-10-CM

## 2012-02-11 MED ORDER — ALPRAZOLAM 0.25 MG PO TABS: 0.2500 mg | ORAL_TABLET | Freq: Two times a day (BID) | ORAL | Status: DC | PRN

## 2012-02-11 MED ORDER — OXYCODONE-ACETAMINOPHEN 5-325 MG PO TABS: 1.0000 | ORAL_TABLET | ORAL | Status: DC | PRN

## 2012-02-11 NOTE — Progress Notes (Signed)
  Subjective:    Patient ID: Denise Webb, female    DOB: 10/15/1965, 46 y.o.   MRN: [id number]  HPI IBS-patient was seen in ED on 6-18 for her worsening of her symptoms. They did not document any abnormalities on exam. Since then she continues to have abdominal pain but this is not worse than her usual. She states that 5 mg Percocets were not enough and she has been taking more than that.  Anxiety-patient states she's been tried on multiple medications including Paxil, Ativan, Klonopin, other SSRIs. She is currently on Xanax but feels like 0.25 is too small a dose. She's been taking between 4 and 8 tablets a day. When asked, she states that she has not run out because she is stretched out how she is taking the pills.   Review of Systems No nausea, vomiting, diarrhea now no hematochezia now    Objective:   Physical Exam Vital signs reviewed General appearance - alert, well appearing, and in no distress        Assessment & Plan:

## 2012-02-11 NOTE — Assessment & Plan Note (Signed)
Refill 2 weeks of Xanax. Patient admits to taking more than prescribed. It is not clear to me how she made the script last for a month if she is taking 4-8 per day. Review of the narcotic database did not show additional prescriptions. However, it also did not show that she filled OxyContin earlier this year.

## 2012-02-11 NOTE — Assessment & Plan Note (Signed)
Seen in ED on 6/18. Reviewed records today. Refilled Percocet enough for 2 weeks. Patient states that she has refills on all the other IBS medications. Advised her to bring all for pill bottles next visit

## 2012-02-11 NOTE — Patient Instructions (Addendum)
I am giving you enough medicine to last 2 weeks Please come see Dr. Margot Ables on either 6/28 or 7/2 He is the only one that can make medication changes to your chronic controlled medications Make sure to bring all your pill bottles to that visit

## 2012-02-19 ENCOUNTER — Ambulatory Visit (INDEPENDENT_AMBULATORY_CARE_PROVIDER_SITE_OTHER): Payer: Self-pay | Admitting: Family Medicine

## 2012-02-19 ENCOUNTER — Telehealth: Payer: Self-pay | Admitting: Family Medicine

## 2012-02-19 VITALS — BP 118/77 | HR 130 | Temp 98.5°F | Ht 68.5 in | Wt 158.0 lb

## 2012-02-19 DIAGNOSIS — K1370 Unspecified lesions of oral mucosa: Secondary | ICD-10-CM

## 2012-02-19 DIAGNOSIS — K1379 Other lesions of oral mucosa: Secondary | ICD-10-CM

## 2012-02-19 DIAGNOSIS — F17200 Nicotine dependence, unspecified, uncomplicated: Secondary | ICD-10-CM

## 2012-02-19 DIAGNOSIS — E781 Pure hyperglyceridemia: Secondary | ICD-10-CM

## 2012-02-19 DIAGNOSIS — F341 Dysthymic disorder: Secondary | ICD-10-CM

## 2012-02-19 DIAGNOSIS — K589 Irritable bowel syndrome without diarrhea: Secondary | ICD-10-CM

## 2012-02-19 DIAGNOSIS — I10 Essential (primary) hypertension: Secondary | ICD-10-CM

## 2012-02-19 MED ORDER — OXYCODONE-ACETAMINOPHEN 5-325 MG PO TABS: 1.0000 | ORAL_TABLET | ORAL | Status: DC | PRN

## 2012-02-19 MED ORDER — ALPRAZOLAM 0.25 MG PO TABS: 0.2500 mg | ORAL_TABLET | Freq: Two times a day (BID) | ORAL | Status: DC | PRN

## 2012-02-19 MED ORDER — LOPERAMIDE HCL 2 MG PO TABS: 2.0000 mg | ORAL_TABLET | Freq: Three times a day (TID) | ORAL | Status: DC | PRN

## 2012-02-19 NOTE — Patient Instructions (Addendum)
Thank you for coming into clinic today I think you are making progress with regards to your IBS control Please make a follow up appointment with me in 1 month for refills Please bring all of your medications with you to your next appointment.

## 2012-02-19 NOTE — Telephone Encounter (Signed)
meds cannot be refilled

## 2012-02-19 NOTE — Telephone Encounter (Signed)
Called pt and told her that she could not get her refill until 7/1. Pt voiced understanding and agreed.Loralee Pacas [place]

## 2012-02-20 NOTE — Assessment & Plan Note (Signed)
Pt not willing to quit at this time

## 2012-02-20 NOTE — Assessment & Plan Note (Signed)
Excellent blood pressure control now the patient is compliant with HCTZ. Continue current therapy.

## 2012-02-20 NOTE — Progress Notes (Signed)
  Subjective:    Patient ID: Denise Webb, female    DOB: 11-21-1965, 46 y.o.   MRN: [id number]  HPI Chief complaint: Anxiety/panic attack, IBS,  Anxiety/panic attack: History of panic attacks for many years. Has been on multiple benzodiazepines in the past for this. Xanax was decreased at previous visit. Patient reports continued panic attacks requiring Xanax to improve. Patient reports breathing techniques and taking time to work through panic attacks. Increased incidence with stress. Patient requesting up to 4 mg Xanax when necessary for panic attacks and describes very eloquently various color types and milligram dosages and how, I, the physician should prescribed a to her. Denies chest pain, lightheadedness, syncope, headache. Patient has tried CBT in psychiatric therapy in the past reports does not help and is managed in pursuing this at this time.  IBS: Patient reports overall improvement with 1 to 2 "IBS attacks per week. Of note patient did make one emergency room visit since last appointment. Where she received pain medications for IBS flare. Reports improvement in symptoms when compliant w/ amitriptyline, bentyl, immodium, probiotic.   Hypertension: Patient compliant with hydrochlorothiazide daily. Denies headache, chest pain, shortness of breath, lightheadedness, syncope, palpitations   Review of Systems History of present illness    Objective:   Physical Exam General: No acute distress Prevascular: Regular rate and rhythm Abdominal: Nonpainful to palpation when distracted. Point tenderness in various abdominal location superficially that change location during exam. No guarding       Assessment & Plan:

## 2012-02-20 NOTE — Assessment & Plan Note (Signed)
Will need to address this at next visit. Patient's appointments typically taken up by IBS discussions this is patient's a concern.

## 2012-02-20 NOTE — Assessment & Plan Note (Addendum)
Patient did not come in as discussed previously. Received pain medications in emergency room. Seen by Dr. Hulen Luster who provided good care at last appointment. Patient prefers to have "pure "oxycodone instead of Percocet. Overall symptoms appear to be improving now that patient is apparently compliant with full IBS therapy. Will not increase pain medications at this time will provide limited Percocet 5/325. Patient's other medications are due to be refilled in August. Will need to call pharmacy to ensure that all prescriptions are refilled before next visit in order to have pain medications refilled.

## 2012-02-20 NOTE — Assessment & Plan Note (Addendum)
"  Panic" attacks appear to be well controlled, especially as pt manages stress in her life. Not sure patient is having to panic attacks. High concern for medication abuse in this patient. Continues to request more Xanax. Pt to not receive any additional Xanax above what is prescribed today. Will not increase Xanax today.

## 2012-02-20 NOTE — Assessment & Plan Note (Signed)
Resolved

## 2012-02-29 ENCOUNTER — Encounter: Payer: Self-pay | Admitting: Family Medicine

## 2012-07-10 ENCOUNTER — Other Ambulatory Visit: Payer: Self-pay

## 2012-07-10 ENCOUNTER — Emergency Department (HOSPITAL_COMMUNITY)
Admission: EM | Admit: 2012-07-10 | Discharge: 2012-07-10 | Disposition: A | Discharge status: Home / Self Care | Payer: Self-pay | Attending: Emergency Medicine | Admitting: Emergency Medicine

## 2012-07-10 ENCOUNTER — Encounter (HOSPITAL_COMMUNITY): Payer: Self-pay | Admitting: *Deleted

## 2012-07-10 ENCOUNTER — Emergency Department (HOSPITAL_COMMUNITY): Payer: Self-pay

## 2012-07-10 DIAGNOSIS — M79609 Pain in unspecified limb: Secondary | ICD-10-CM | POA: Insufficient documentation

## 2012-07-10 DIAGNOSIS — R209 Unspecified disturbances of skin sensation: Secondary | ICD-10-CM | POA: Insufficient documentation

## 2012-07-10 DIAGNOSIS — Z79899 Other long term (current) drug therapy: Secondary | ICD-10-CM | POA: Insufficient documentation

## 2012-07-10 DIAGNOSIS — F17200 Nicotine dependence, unspecified, uncomplicated: Secondary | ICD-10-CM | POA: Insufficient documentation

## 2012-07-10 DIAGNOSIS — R05 Cough: Secondary | ICD-10-CM

## 2012-07-10 DIAGNOSIS — I10 Essential (primary) hypertension: Secondary | ICD-10-CM | POA: Insufficient documentation

## 2012-07-10 DIAGNOSIS — Z8719 Personal history of other diseases of the digestive system: Secondary | ICD-10-CM | POA: Insufficient documentation

## 2012-07-10 DIAGNOSIS — M79603 Pain in arm, unspecified: Secondary | ICD-10-CM

## 2012-07-10 DIAGNOSIS — R0609 Other forms of dyspnea: Secondary | ICD-10-CM | POA: Insufficient documentation

## 2012-07-10 DIAGNOSIS — Z8739 Personal history of other diseases of the musculoskeletal system and connective tissue: Secondary | ICD-10-CM | POA: Insufficient documentation

## 2012-07-10 DIAGNOSIS — R0989 Other specified symptoms and signs involving the circulatory and respiratory systems: Secondary | ICD-10-CM | POA: Insufficient documentation

## 2012-07-10 DIAGNOSIS — R059 Cough, unspecified: Secondary | ICD-10-CM | POA: Insufficient documentation

## 2012-07-10 LAB — BASIC METABOLIC PANEL
BUN: 9 mg/dL (ref 6–23)
Calcium: 9.5 mg/dL (ref 8.4–10.5)
GFR calc Af Amer: >90 mL/min (ref >90)
GFR calc non Af Amer: >90 mL/min (ref >90)
Glucose, Bld: 148 mg/dL — ABNORMAL HIGH (ref 70–99)
Sodium: 135 mEq/L (ref 135–145)

## 2012-07-10 LAB — CBC
HCT: 38.2 % (ref 36.0–46.0)
Hemoglobin: 13.4 g/dL (ref 12.0–15.0)
MCH: 34.5 pg — ABNORMAL HIGH (ref 26.0–34.0)
MCHC: 35.1 g/dL (ref 30.0–36.0)
RDW: 12.2 % (ref 11.5–15.5)

## 2012-07-10 MED ORDER — AMOXICILLIN-POT CLAVULANATE 875-125 MG PO TABS: 1.0000 | ORAL_TABLET | Freq: Two times a day (BID) | ORAL | Status: AC

## 2012-07-10 MED ORDER — PREDNISONE 20 MG PO TABS: 60.0000 mg | ORAL_TABLET | Freq: Every day | ORAL | Status: AC

## 2012-07-10 MED ORDER — HYDROMORPHONE HCL PF 1 MG/ML IJ SOLN
1.0000 mg | Freq: Once | INTRAMUSCULAR | Status: AC
  Administered 2012-07-10: 1 mg via INTRAVENOUS
  Filled 2012-07-10: qty 1

## 2012-07-10 MED ORDER — HYDROCODONE-ACETAMINOPHEN 5-325 MG PO TABS: 1.0000 | ORAL_TABLET | Freq: Three times a day (TID) | ORAL | Status: DC | PRN

## 2012-07-10 MED ORDER — AMOXICILLIN-POT CLAVULANATE 875-125 MG PO TABS
1.0000 | ORAL_TABLET | Freq: Once | ORAL | Status: AC
  Administered 2012-07-10: 1 via ORAL
  Filled 2012-07-10: qty 1

## 2012-07-10 MED ORDER — PREDNISONE 20 MG PO TABS
60.0000 mg | ORAL_TABLET | Freq: Once | ORAL | Status: AC
  Administered 2012-07-10: 60 mg via ORAL
  Filled 2012-07-10: qty 3

## 2012-07-10 NOTE — ED Notes (Signed)
Pt presents to department for evaluation of midsternal chest pain radiating to both arms. States episodes of chest pain intermittent x1 week, but pain increased this morning @07 :00. 10/10 pain at the time, describes as constant pressure sensation. Also states continued numbness to both hands/arms. Also states SOB. Respirations unlabored. Skin warm and dry.

## 2012-07-10 NOTE — ED Provider Notes (Signed)
History     CSN: [id number]  Arrival date & time 07/10/12  1400   First MD Initiated Contact with Patient 07/10/12 1615      Chief Complaint  Patient presents with  . Chest Pain    (Consider location/radiation/quality/duration/timing/severity/associated sxs/prior treatment) HPI The patient presents with several complaints. Complaint #1 is chest pain with dyspnea.  She notes that this began suddenly one week ago.  Since onset symptoms have been progressive with concurrent cough.  She also notes mild coarse voice.  She denies fever, chills. No clear alleviating or exacerbating factors.  Complaint #2 his right arm dysesthesia, and pain.  She states that she frequently has pain throughout both distal upper extremities after awakening, but today her typical pain was significantly increased.  The pain is focally from the elbow through the distal end of the hand.  The pain is increased with any motion, gripping.  No relief with anything. Past Medical History  Diagnosis Date  . Hypertension   . IBS (irritable bowel syndrome)   . Arthritis   . Plantar fasciitis     Past Surgical History  Procedure Date  . Tubal ligation   . Cesarean section     History reviewed. No pertinent family history.  History  Substance Use Topics  . Smoking status: Current Every Day Smoker -- 0.5 packs/day    Types: Cigarettes  . Smokeless tobacco: Never Used  . Alcohol Use: Yes     Comment: occasionally    OB History    Grav Para Term Preterm Abortions TAB SAB Ect Mult Living                  Review of Systems  Constitutional:       Per HPI, otherwise negative  HENT:       Per HPI, otherwise negative  Eyes: Negative.   Respiratory:       Per HPI, otherwise negative  Cardiovascular:       Per HPI, otherwise negative  Gastrointestinal: Negative for vomiting.  Genitourinary: Negative.   Musculoskeletal:       Per HPI, otherwise negative  Skin: Negative.   Neurological: Negative for  syncope.    Allergies  Erythromycin stearate and Morphine and related  Home Medications   Current Outpatient Rx  Name  Route  Sig  Dispense  Refill  . ALPRAZOLAM 0.25 MG PO TABS   Oral   Take 0.25 mg by mouth 2 (two) times daily as needed.         Marland Kitchen AMITRIPTYLINE HCL 100 MG PO TABS   Oral   Take 2 tablets (200 mg total) by mouth at bedtime.   60 tablet   3   . ATENOLOL 25 MG PO TABS   Oral   Take 25 mg by mouth daily.         Marland Kitchen DICYCLOMINE HCL 20 MG PO TABS   Oral   Take 40 mg by mouth every 4 (four) hours.         Marland Kitchen HYDROCHLOROTHIAZIDE 12.5 MG PO CAPS   Oral   Take 1 capsule (12.5 mg total) by mouth daily.   30 capsule   3   . ACIDOPHILUS PROBIOTIC 100 MG PO CAPS   Oral   Take 1 capsule (100 mg total) by mouth daily.   30 capsule   3   . LOPERAMIDE HCL 2 MG PO TABS   Oral   Take 2 mg by mouth 3 (three) times daily as  needed. Take 2 tabs by mouth once daily as needed for diarrhea.         . OXYCODONE HCL 10 MG PO TABS   Oral   Take 10 mg by mouth 3 (three) times daily as needed. For pain           BP 124/85  Pulse 97  Temp 98.2 F (36.8 C) (Oral)  Resp 14  SpO2 97%  Physical Exam  Nursing note and vitals reviewed. Constitutional: She is oriented to person, place, and time. She appears well-developed and well-nourished. No distress.  HENT:  Head: Normocephalic and atraumatic.  Eyes: Conjunctivae normal and EOM are normal.  Cardiovascular: Normal rate and regular rhythm.   Pulmonary/Chest: Effort normal and breath sounds normal. No stridor. No respiratory distress.  Abdominal: She exhibits no distension.  Musculoskeletal: She exhibits no edema.       The dimensions of the right and left upper extremities are similar. The patient is hesitant to have palpation of the right upper extremity, there is no significant discomfort on palpation.  Patient's range of motion is appropriate in the wrist and all digits.  Grip strength is appropriate.   Sensation is appropriate throughout.  No LE asym, edema  Neurological: She is alert and oriented to person, place, and time. No cranial nerve deficit.  Skin: Skin is warm and dry.  Psychiatric: She has a normal mood and affect.    ED Course  Procedures (including critical care time)  Labs Reviewed  CBC - Abnormal; Notable for the following:    MCH 34.5 (*)     All other components within normal limits  BASIC METABOLIC PANEL - Abnormal; Notable for the following:    Potassium 3.4 (*)     Glucose, Bld 148 (*)     All other components within normal limits  POCT I-STAT TROPONIN I  TROPONIN I  PRO B NATRIURETIC PEPTIDE   No results found.   No diagnosis found.  6:18 PM Patient reports some improvement with analgesics.  We discussed her x-ray, which I interpreted.  There is notable changes consistent with long-term smoking.  Smoking cessation counseling was provided.  Cardiac 90 sinus rhythm normal Pulse ox 99% room air normal    Date: 07/10/2012  Rate: 119  Rhythm: sinus tachycardia  QRS Axis: right  Intervals: normal  ST/T Wave abnormalities: normal  Conduction Disutrbances:none  Narrative Interpretation:   Old EKG Reviewed: none available ABNORMAL     MDM  The patient presents with 2 chief complaints.  Primarily, the patient's concern of new right distal upper extremity dysesthesia.  Her endorsement of similar symptoms have been occurring for weeks to months, largely after positioning, is suggestive of a peripheral neuropathy.  On exam, the patient's symptoms have largely resolved, she is appropriate in all range of motion, strength, sensation evaluations. The patient's other concern regarding chest pain, cough, dyspnea, was evaluated with labs, x-ray, and with a family history of early cardiac death and congestive heart failure she is cardiac evaluation as well.  However, the patient was in no distress, and although she smokes, she has no other risk factors.  The  patient's EKG is nonischemic, she was in no distress, and absent tachypnea, hypoxia, ongoing tachycardia there is low suspicion for DVT/PE, or ongoing coronary ischemia.  We discussed the need for additional evaluation of all of her concerns.  Given her smoking, the XR, and her ongoing cough, she was treated for bronchitis.     Gerhard Munch,  MD 07/10/12 1823

## 2012-07-10 NOTE — ED Notes (Signed)
Pt reports recent cough, having chest heaviness since this am, increases with cough. Also having pain to right arm and hand. ekg done at triage. Airway intact.

## 2013-09-19 ENCOUNTER — Encounter: Payer: Self-pay | Admitting: Internal Medicine

## 2013-09-19 ENCOUNTER — Ambulatory Visit (INDEPENDENT_AMBULATORY_CARE_PROVIDER_SITE_OTHER): Payer: No Typology Code available for payment source | Admitting: Internal Medicine

## 2013-09-19 VITALS — BP 150/96 | HR 94 | Temp 98.1°F | Ht 68.5 in | Wt 177.6 lb

## 2013-09-19 DIAGNOSIS — M792 Neuralgia and neuritis, unspecified: Secondary | ICD-10-CM

## 2013-09-19 DIAGNOSIS — F1190 Opioid use, unspecified, uncomplicated: Secondary | ICD-10-CM

## 2013-09-19 DIAGNOSIS — F1110 Opioid abuse, uncomplicated: Secondary | ICD-10-CM

## 2013-09-19 DIAGNOSIS — E781 Pure hyperglyceridemia: Secondary | ICD-10-CM

## 2013-09-19 DIAGNOSIS — M79605 Pain in left leg: Secondary | ICD-10-CM

## 2013-09-19 DIAGNOSIS — IMO0002 Reserved for concepts with insufficient information to code with codable children: Secondary | ICD-10-CM

## 2013-09-19 DIAGNOSIS — F17200 Nicotine dependence, unspecified, uncomplicated: Secondary | ICD-10-CM

## 2013-09-19 DIAGNOSIS — M549 Dorsalgia, unspecified: Secondary | ICD-10-CM

## 2013-09-19 DIAGNOSIS — F341 Dysthymic disorder: Secondary | ICD-10-CM

## 2013-09-19 DIAGNOSIS — I10 Essential (primary) hypertension: Secondary | ICD-10-CM

## 2013-09-19 DIAGNOSIS — K589 Irritable bowel syndrome without diarrhea: Secondary | ICD-10-CM

## 2013-09-19 DIAGNOSIS — M79609 Pain in unspecified limb: Secondary | ICD-10-CM

## 2013-09-19 DIAGNOSIS — R109 Unspecified abdominal pain: Secondary | ICD-10-CM

## 2013-09-19 DIAGNOSIS — M79604 Pain in right leg: Secondary | ICD-10-CM

## 2013-09-19 HISTORY — DX: Unspecified abdominal pain: R10.9

## 2013-09-19 MED ORDER — HYDROCHLOROTHIAZIDE 25 MG PO TABS: 25.0000 mg | ORAL_TABLET | Freq: Every day | ORAL | Status: DC

## 2013-09-19 MED ORDER — PREGABALIN 25 MG PO CAPS: 25.0000 mg | ORAL_CAPSULE | Freq: Two times a day (BID) | ORAL | Status: DC

## 2013-09-19 MED ORDER — PREGABALIN 50 MG PO CAPS: 50.0000 mg | ORAL_CAPSULE | Freq: Two times a day (BID) | ORAL | Status: DC

## 2013-09-19 NOTE — Progress Notes (Signed)
Patient ID: Denise Webb, female   DOB: 30-Mar-1966, 48 y.o.   MRN: [id number]    Chief Complaint  Patient presents with  . Establish Care    NP-Establish Care  . other    pain in abdomen and feet   Allergies  Allergen Reactions  . Erythromycin Stearate     REACTION: causes anxiety, heart to race  . Morphine And Related Nausea And Vomiting   HPI 48 y/o female pt is here to establish care. She provides history of irritable bowel syndrome, b/l feet pain, neuropathy and high blood pressure with GAD. She was seeing Dr Antonietta Jewel at [place] prior to this.  She self stopped her gabapentin with no relief. Today she has some nausea and abdominal discomfort since am. She has a habit of skipping food. Denies any diarrhea or loose stool at present. Mentions she takes oxycodone and prn imodium for this. Her feet and legs are bothering her- burning pain and sensation. No cramps. No radiation elsewhere  Review of Systems  Constitutional: Negative for fever, chills, weight loss, malaise/fatigue and diaphoresis.  HENT: Negative for congestion, hearing loss and sore throat.   Eyes: Negative for blurred vision, double vision and discharge.  Respiratory: Negative for cough, sputum production, shortness of breath and wheezing.   Cardiovascular: Negative for chest pain, palpitations, orthopnea and leg swelling.  Gastrointestinal: Negative for heartburn, vomiting, diarrhea and constipation.  Genitourinary: Negative for dysuria, urgency, frequency and flank pain.  Musculoskeletal: Negative for falls, joint pain and myalgias.  Skin: Negative for itching and rash.  Neurological: Negative for dizziness, tingling, focal weakness and headaches.  Psychiatric/Behavioral: Negative for depression and memory loss. The patient is nervous/anxious.    Past Medical History  Diagnosis Date  . Hypertension   . IBS (irritable bowel syndrome)   . Arthritis   . Plantar fasciitis   . Anxiety   . Depression   .  Neuropathy     severe both feet/lower legs  . Hyperlipidemia   . TMJ (dislocation of temporomandibular joint)   . IBS (irritable bowel syndrome) 2011-2014    ER visits only    Current Outpatient Prescriptions on File Prior to Visit  Medication Sig Dispense Refill  . atenolol (TENORMIN) 25 MG tablet Take 25 mg by mouth daily.      . Lactobacillus (ACIDOPHILUS PROBIOTIC) 100 MG CAPS Take 1 capsule (100 mg total) by mouth daily.  30 capsule  3  . loperamide (IMODIUM A-D) 2 MG tablet Take 2 mg by mouth 3 (three) times daily as needed. Take 2 tabs by mouth once daily as needed for diarrhea.      Marland Kitchen amitriptyline (ELAVIL) 100 MG tablet Take 2 tablets (200 mg total) by mouth at bedtime.  60 tablet  3  . dicyclomine (BENTYL) 20 MG tablet Take 40 mg by mouth every 4 (four) hours.       No current facility-administered medications on file prior to visit.    Physical exam BP 150/96  Pulse 94  Temp(Src) 98.1 F (36.7 C) (Oral)  Ht 5' 8.5" (1.74 m)  Wt 177 lb 9.6 oz (80.559 kg)  BMI 26.61 kg/m2  SpO2 98%  LMP 12/14/2011  General- adult female in no acute distress Head- atraumatic, normocephalic Eyes- PERRLA, EOMI, no pallor, no icterus, no discharge Neck- no lymphadenopathy, no thyromegaly, no jugular vein distension, no carotid bruit Cardiovascular- normal s1,s2, no murmurs/ rubs/ gallops Respiratory- bilateral clear to auscultation, no wheeze, no rhonchi, no crackles Abdomen- bowel sounds present, soft,  generalized tenderness more in epigastric and RLQ area. No rebound, no guarding or rigidity Musculoskeletal- able to move all 4 extremities, no spinal and paraspinal tenderness Neurological- no focal deficit, decreased pinprick sensation upto mid leg region, normal vibration sense Psychiatry- alert and oriented to person, place and time, normal mood, anxious  Labs- none available for review  Assessment/plan  1. Essential hypertension, benign Elevated bp today likely from her pain.  Continue atenolol and hctz for now. Pt mentions bp being normal at home. Asked to bring her bp machine and home readings next visit. Warning signs with elevated bp explained - CMP  2. Irritable bowel syndrome - CBC with Differential  3. TOBACCO ABUSE Encouraged to stop smoking  4. HYPERTRIGLYCERIDEMIA Off statin. Check flp - Lipid Panel  5. BACK PAIN, CHRONIC Continue oxycodone. Will get records from pcp of imagings if any  6. ANXIETY DEPRESSION Continue xanax for now. Verified med with pharmacy  7. Abdominal pain, unspecified site Generalized. No signs of peritonitis. Will check her cmp for liver function and kidney function. Has hx of IBS. Get records from [facility]. After revieweing this and lab, consider rx for IBS  8. Leg pain, bilateral On oxycodone at present.  - NCV with EMG(electromyography); Future  9. Neuropathic pain Unclear reason. R/o DM and thyroid abnormality. Will get nerve conduction study to rule out neuropathy. lyrica 50 mg bid for now - Hemoglobin A1c, tsh - NCV with EMG(electromyography); Future  10. Chronic narcotic use - Drug Screen, Urine

## 2013-09-20 LAB — COMPREHENSIVE METABOLIC PANEL
A/G RATIO: 2 (ref 1.1–2.5)
ALBUMIN: 4.9 g/dL (ref 3.5–5.5)
ALT: 34 IU/L — ABNORMAL HIGH (ref 0–32)
AST: 22 IU/L (ref 0–40)
Alkaline Phosphatase: 107 IU/L (ref 39–117)
BUN/Creatinine Ratio: 8 — ABNORMAL LOW (ref 9–23)
BUN: 5 mg/dL — AB (ref 6–24)
CALCIUM: 10.4 mg/dL — AB (ref 8.7–10.2)
CO2: 24 mmol/L (ref 18–29)
CREATININE: 0.63 mg/dL (ref 0.57–1.00)
Chloride: 99 mmol/L (ref 97–108)
GFR calc Af Amer: 125 mL/min/{1.73_m2} (ref >59)
GFR, EST NON AFRICAN AMERICAN: 109 mL/min/{1.73_m2} (ref >59)
GLOBULIN, TOTAL: 2.5 g/dL (ref 1.5–4.5)
GLUCOSE: 166 mg/dL — AB (ref 65–99)
Potassium: 5.1 mmol/L (ref 3.5–5.2)
SODIUM: 141 mmol/L (ref 134–144)
TOTAL PROTEIN: 7.4 g/dL (ref 6.0–8.5)
Total Bilirubin: 0.3 mg/dL (ref 0.0–1.2)

## 2013-09-20 LAB — HEMOGLOBIN A1C
ESTIMATED AVERAGE GLUCOSE: 183 mg/dL
Hgb A1c MFr Bld: 8 % — ABNORMAL HIGH (ref 4.8–5.6)

## 2013-09-20 LAB — DRUG SCREEN, URINE
Amphetamines, Urine: NEGATIVE ng/mL
Barbiturates: POSITIVE ng/mL
Benzodiazepines: POSITIVE ng/mL
CANNABINOID: NEGATIVE ng/mL
COCAINE METABOLITE: NEGATIVE ng/mL
Opiates: NEGATIVE ng/mL
PHENCYCLIDINE: NEGATIVE ng/mL

## 2013-09-20 LAB — CBC WITH DIFFERENTIAL/PLATELET
BASOS ABS: 0 10*3/uL (ref 0.0–0.2)
Basos: 0 %
EOS: 1 %
Eosinophils Absolute: 0.1 10*3/uL (ref 0.0–0.4)
HEMATOCRIT: 43 % (ref 34.0–46.6)
Hemoglobin: 14.7 g/dL (ref 11.1–15.9)
IMMATURE GRANS (ABS): 0 10*3/uL (ref 0.0–0.1)
IMMATURE GRANULOCYTES: 0 %
LYMPHS ABS: 2.4 10*3/uL (ref 0.7–3.1)
Lymphs: 32 %
MCH: 33.9 pg — ABNORMAL HIGH (ref 26.6–33.0)
MCHC: 34.2 g/dL (ref 31.5–35.7)
MCV: 99 fL — ABNORMAL HIGH (ref 79–97)
MONOCYTES: 7 %
MONOS ABS: 0.5 10*3/uL (ref 0.1–0.9)
NEUTROS PCT: 60 %
Neutrophils Absolute: 4.4 10*3/uL (ref 1.4–7.0)
RBC: 4.33 x10E6/uL (ref 3.77–5.28)
RDW: 12.6 % (ref 12.3–15.4)
WBC: 7.3 10*3/uL (ref 3.4–10.8)

## 2013-09-20 LAB — LIPID PANEL
CHOL/HDL RATIO: 7.4 ratio — AB (ref 0.0–4.4)
Cholesterol, Total: 281 mg/dL — ABNORMAL HIGH (ref 100–199)
HDL: 38 mg/dL — AB (ref >39)
TRIGLYCERIDES: 555 mg/dL — AB (ref 0–149)

## 2013-10-04 ENCOUNTER — Encounter: Payer: Self-pay | Admitting: Internal Medicine

## 2013-10-04 ENCOUNTER — Ambulatory Visit (INDEPENDENT_AMBULATORY_CARE_PROVIDER_SITE_OTHER): Payer: No Typology Code available for payment source | Admitting: Internal Medicine

## 2013-10-04 VITALS — BP 120/82 | HR 101 | Temp 98.7°F | Resp 18 | Ht 68.5 in | Wt 178.4 lb

## 2013-10-04 DIAGNOSIS — F341 Dysthymic disorder: Secondary | ICD-10-CM

## 2013-10-04 DIAGNOSIS — E1149 Type 2 diabetes mellitus with other diabetic neurological complication: Secondary | ICD-10-CM

## 2013-10-04 DIAGNOSIS — I10 Essential (primary) hypertension: Secondary | ICD-10-CM

## 2013-10-04 DIAGNOSIS — M792 Neuralgia and neuritis, unspecified: Secondary | ICD-10-CM

## 2013-10-04 DIAGNOSIS — E1165 Type 2 diabetes mellitus with hyperglycemia: Principal | ICD-10-CM

## 2013-10-04 DIAGNOSIS — E785 Hyperlipidemia, unspecified: Secondary | ICD-10-CM

## 2013-10-04 DIAGNOSIS — E1140 Type 2 diabetes mellitus with diabetic neuropathy, unspecified: Secondary | ICD-10-CM | POA: Insufficient documentation

## 2013-10-04 DIAGNOSIS — E1142 Type 2 diabetes mellitus with diabetic polyneuropathy: Secondary | ICD-10-CM

## 2013-10-04 DIAGNOSIS — IMO0002 Reserved for concepts with insufficient information to code with codable children: Secondary | ICD-10-CM

## 2013-10-04 LAB — SPECIMEN STATUS REPORT

## 2013-10-04 MED ORDER — ATORVASTATIN CALCIUM 10 MG PO TABS: 10.0000 mg | ORAL_TABLET | Freq: Every day | ORAL | Status: DC

## 2013-10-04 MED ORDER — METFORMIN HCL 500 MG PO TABS: 500.0000 mg | ORAL_TABLET | Freq: Two times a day (BID) | ORAL | Status: DC

## 2013-10-04 MED ORDER — GLUCOSE BLOOD VI STRP: ORAL_STRIP | Status: DC

## 2013-10-04 MED ORDER — OXYCODONE HCL 10 MG PO TABS: 10.0000 mg | ORAL_TABLET | Freq: Three times a day (TID) | ORAL | Status: DC

## 2013-10-04 MED ORDER — FREESTYLE LANCETS MISC: Status: DC

## 2013-10-04 MED ORDER — ALPRAZOLAM 1 MG PO TABS: 1.0000 mg | ORAL_TABLET | Freq: Three times a day (TID) | ORAL | Status: DC

## 2013-10-04 NOTE — Progress Notes (Signed)
Patient ID: Denise Webb, female   DOB: February 03, 1966, 48 y.o.   MRN: [id number]    Chief Complaint  Patient presents with  . Follow-up    new diabetes, high cholesterol, pain med refill   Allergies  Allergen Reactions  . Erythromycin Stearate     REACTION: causes anxiety, heart to race  . Morphine And Related Nausea And Vomiting   HPI 48 y/o female pt is here for follow up on her labwork done last visit. She needs refills on her narcotics and anxiety medication. Reviewed her EMG report showing severe neuropathy. She continue to have leg pain and is taking lyrica with some relief  Review of Systems  Constitutional: Negative for fever, chills, weight loss, malaise/fatigue and diaphoresis.  HENT: Negative for congestion, hearing loss and sore throat.   Eyes: Negative for blurred vision, double vision and discharge.  Respiratory: Negative for cough, sputum production, shortness of breath and wheezing.   Cardiovascular: Negative for chest pain, palpitations, orthopnea and leg swelling.  Gastrointestinal: Negative for heartburn, vomiting, diarrhea and constipation.  Genitourinary: Negative for dysuria, urgency, frequency and flank pain.  Musculoskeletal: Negative for falls, joint pain and myalgias.  Skin: Negative for itching and rash.  Neurological: Negative for dizziness, tingling, focal weakness and headaches.  Psychiatric/Behavioral: Negative for depression and memory loss. The patient is nervous/anxious.    Past Medical History  Diagnosis Date  . Hypertension   . IBS (irritable bowel syndrome)   . Arthritis   . Plantar fasciitis   . Anxiety   . Depression   . Neuropathy     severe both feet/lower legs  . Hyperlipidemia   . TMJ (dislocation of temporomandibular joint)   . IBS (irritable bowel syndrome) 2011-2014    ER visits only    Current Outpatient Prescriptions on File Prior to Visit  Medication Sig Dispense Refill  . aspirin-acetaminophen-caffeine (EXCEDRIN  MIGRAINE) 250-250-65 MG per tablet Take by mouth every 6 (six) hours as needed for headache.      Marland Kitchen atenolol (TENORMIN) 25 MG tablet Take 25 mg by mouth daily.      . hydrochlorothiazide (HYDRODIURIL) 25 MG tablet Take 1 tablet (25 mg total) by mouth daily.  90 tablet  3  . Lactobacillus (ACIDOPHILUS PROBIOTIC) 100 MG CAPS Take 1 capsule (100 mg total) by mouth daily.  30 capsule  3  . pregabalin (LYRICA) 50 MG capsule Take 1 capsule (50 mg total) by mouth 2 (two) times daily.  60 capsule  0  . loperamide (IMODIUM A-D) 2 MG tablet Take 2 mg by mouth 3 (three) times daily as needed. Take 2 tabs by mouth once daily as needed for diarrhea.       No current facility-administered medications on file prior to visit.    Past Surgical History  Procedure Laterality Date  . Tubal ligation    . Cesarean section  1988    [facility]  . Pelvic exenteration  1990    Uterus Frozen (cancer)  . Tubes tied  2004/2005    [facility]    Physical exam BP 120/82  Pulse 101  Temp(Src) 98.7 F (37.1 C) (Oral)  Resp 18  Ht 5' 8.5" (1.74 m)  Wt 178 lb 6.4 oz (80.922 kg)  BMI 26.73 kg/m2  SpO2 98%  LMP 12/14/2011  General- adult female in no acute distress Head- atraumatic, normocephalic Eyes- PERRLA, EOMI, no pallor, no icterus, no discharge Cardiovascular- normal s1,s2, no murmurs/ rubs/ gallops Respiratory- bilateral clear to auscultation, no wheeze, no rhonchi,  no crackles Abdomen- bowel sounds present, soft, generalized tenderness more in epigastric and RLQ area. No rebound, no guarding or rigidity Musculoskeletal- able to move all 4 extremities, no spinal and paraspinal tenderness Neurological- no focal deficit, decreased pinprick sensation upto mid leg region, normal vibration sense Psychiatry- alert and oriented to person, place and time, normal mood, anxious  Labs-  Lab Results  Component Value Date   HGBA1C 8.0* 09/19/2013   Lipid Panel     Component Value Date/Time   CHOL 230* 08/08/2009  1947   TRIG 555* 09/19/2013 1135   HDL 38* 09/19/2013 1135   HDL 44 08/08/2009 1947   CHOLHDL 7.4* 09/19/2013 1135   CHOLHDL 5.2 Ratio 08/08/2009 1947   VLDL 78* 08/08/2009 1947   LDLCALC Comment 09/19/2013 1135   LDLCALC 108* 08/08/2009 1947   CMP     Component Value Date/Time   NA 141 09/19/2013 1135   NA 135 07/10/2012 1410   K 5.1 09/19/2013 1135   CL 99 09/19/2013 1135   CO2 24 09/19/2013 1135   GLUCOSE 166* 09/19/2013 1135   GLUCOSE 148* 07/10/2012 1410   BUN 5* 09/19/2013 1135   BUN 9 07/10/2012 1410   CREATININE 0.63 09/19/2013 1135   CALCIUM 10.4* 09/19/2013 1135   PROT 7.4 09/19/2013 1135   PROT 7.4 02/09/2012 1604   ALBUMIN 3.7 02/09/2012 1604   AST 22 09/19/2013 1135   ALT 34* 09/19/2013 1135   ALKPHOS 107 09/19/2013 1135   BILITOT 0.3 09/19/2013 1135   GFRNONAA 109 09/19/2013 1135   GFRAA 125 09/19/2013 1135    EMG 1/15- 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 DM type 2, uncontrolled, with neuropathy Reviewed a1c. Will start her on metformin 500 mg bid, glucometer provided. To monitor cbg once a day and bring readings for review on next office visit. Will check her urine microalbumin next visit.   2. Other and unspecified hyperlipidemia Will start her on lipitor 10 mg daily for now. Advised about dietary modification and exercise  3. Polyneuropathic pain Continue lyrica 50 mg bid for now with her oxycodone, refill provided  4.Essential hypertension, benign Improved reading. Continue atenolol and hctz for now.    5.anxiety Continue xanax, refill provided

## 2013-10-05 DIAGNOSIS — M792 Neuralgia and neuritis, unspecified: Secondary | ICD-10-CM | POA: Insufficient documentation

## 2013-10-05 DIAGNOSIS — E785 Hyperlipidemia, unspecified: Secondary | ICD-10-CM | POA: Insufficient documentation

## 2013-10-12 LAB — SPECIMEN STATUS REPORT

## 2013-10-12 LAB — TSH: TSH: 0.985 u[IU]/mL (ref 0.450–4.500)

## 2013-10-17 ENCOUNTER — Encounter: Payer: Self-pay | Admitting: Internal Medicine

## 2013-10-23 DIAGNOSIS — Z0289 Encounter for other administrative examinations: Secondary | ICD-10-CM

## 2013-10-25 ENCOUNTER — Encounter: Payer: No Typology Code available for payment source | Admitting: Internal Medicine

## 2013-10-31 ENCOUNTER — Other Ambulatory Visit: Payer: Self-pay | Admitting: *Deleted

## 2013-10-31 MED ORDER — OXYCODONE HCL 10 MG PO TABS: 10.0000 mg | ORAL_TABLET | Freq: Three times a day (TID) | ORAL | Status: DC

## 2013-10-31 MED ORDER — ALPRAZOLAM 1 MG PO TABS: 1.0000 mg | ORAL_TABLET | Freq: Three times a day (TID) | ORAL | Status: DC

## 2013-11-14 ENCOUNTER — Encounter: Payer: Self-pay | Admitting: *Deleted

## 2013-11-15 ENCOUNTER — Ambulatory Visit (INDEPENDENT_AMBULATORY_CARE_PROVIDER_SITE_OTHER): Payer: No Typology Code available for payment source | Admitting: Internal Medicine

## 2013-11-15 ENCOUNTER — Encounter: Payer: Self-pay | Admitting: Internal Medicine

## 2013-11-15 VITALS — BP 146/90 | HR 87 | Temp 98.3°F | Resp 10 | Ht 68.0 in | Wt 180.2 lb

## 2013-11-15 DIAGNOSIS — IMO0002 Reserved for concepts with insufficient information to code with codable children: Secondary | ICD-10-CM

## 2013-11-15 DIAGNOSIS — K589 Irritable bowel syndrome without diarrhea: Secondary | ICD-10-CM

## 2013-11-15 DIAGNOSIS — M792 Neuralgia and neuritis, unspecified: Secondary | ICD-10-CM

## 2013-11-15 DIAGNOSIS — I10 Essential (primary) hypertension: Secondary | ICD-10-CM

## 2013-11-15 DIAGNOSIS — E1165 Type 2 diabetes mellitus with hyperglycemia: Principal | ICD-10-CM

## 2013-11-15 DIAGNOSIS — E1140 Type 2 diabetes mellitus with diabetic neuropathy, unspecified: Secondary | ICD-10-CM

## 2013-11-15 DIAGNOSIS — E1149 Type 2 diabetes mellitus with other diabetic neurological complication: Secondary | ICD-10-CM

## 2013-11-15 DIAGNOSIS — E1142 Type 2 diabetes mellitus with diabetic polyneuropathy: Secondary | ICD-10-CM

## 2013-11-15 MED ORDER — PROMETHAZINE HCL 25 MG PO TABS: 25.0000 mg | ORAL_TABLET | Freq: Four times a day (QID) | ORAL | Status: DC | PRN

## 2013-11-15 MED ORDER — LINACLOTIDE 290 MCG PO CAPS: 290.0000 ug | ORAL_CAPSULE | Freq: Every day | ORAL | Status: DC

## 2013-11-15 NOTE — Progress Notes (Signed)
Patient ID: Denise Webb, female   DOB: September 06, 1965, 48 y.o.   MRN: [id number]     Chief Complaint  Patient presents with  . Medical Managment of Chronic Issues    dm, hyperlipidemia, irritable bowel syndrome   Allergies  Allergen Reactions  . Erythromycin Stearate     REACTION: causes anxiety, heart to race  . Morphine And Related Nausea And Vomiting   HPI 48 y/o female patient is here for follow up visit for her DM. She was recently diagnosed with diabetes and started on metformin. She is tolerating it fine. cbg 114- 270 with one reading of 97 and one of 203. No hypoglycemia reported She continues to have pain in her legs. She has history of neuropathy confirmed by EMG, irritable bowel syndrome and GAD. She is complaint with her medications She stopped her lyrica due to cost issue Her new insurance starts in April She is self pay at present and cannot afford referrals or new medications  Review of Systems  Constitutional: Negative for fever, chills, weight loss, diaphoresis.  HENT: Negative for congestion, hearing loss and sore throat.   Eyes: Negative for blurred vision, double vision and discharge.  Respiratory: Negative for cough, sputum production, shortness of breath and wheezing.   Cardiovascular: Negative for chest pain, palpitations, orthopnea and leg swelling.  Gastrointestinal: Negative for heartburn. Has nausea and frequent loose stools with IBS flare up Genitourinary: Negative for dysuria, urgency, frequency and flank pain.  Musculoskeletal: Negative for falls Skin: Negative for itching and rash.  Neurological: Negative for dizziness, tingling, focal weakness and headaches.  Psychiatric/Behavioral: Negative for depression and memory loss. The patient is anxious.    Past Medical History  Diagnosis Date  . Hypertension   . IBS (irritable bowel syndrome)   . Arthritis   . Plantar fasciitis   . Anxiety   . Depression   . Neuropathy     severe both feet/lower  legs  . Hyperlipidemia   . TMJ (dislocation of temporomandibular joint)   . IBS (irritable bowel syndrome) 2011-2014    ER visits only    Past Surgical History  Procedure Laterality Date  . Tubal ligation    . Cesarean section  1988    [facility]  . Pelvic exenteration  1990    Uterus Frozen (cancer)  . Tubes tied  2004/2005    [facility]   Current Outpatient Prescriptions on File Prior to Visit  Medication Sig Dispense Refill  . ALPRAZolam (XANAX) 1 MG tablet Take 1 tablet (1 mg total) by mouth 3 (three) times daily.  90 tablet  0  . aspirin-acetaminophen-caffeine (EXCEDRIN MIGRAINE) 174-944-96 MG per tablet Take by mouth every 6 (six) hours as needed for headache.      Marland Kitchen atenolol (TENORMIN) 25 MG tablet Take 25 mg by mouth daily.      Marland Kitchen atorvastatin (LIPITOR) 10 MG tablet Take 1 tablet (10 mg total) by mouth daily.  90 tablet  3  . hydrochlorothiazide (HYDRODIURIL) 25 MG tablet Take 1 tablet (25 mg total) by mouth daily.  90 tablet  3  . Lactobacillus (ACIDOPHILUS PROBIOTIC) 100 MG CAPS Take 1 capsule (100 mg total) by mouth daily.  30 capsule  3  . loperamide (IMODIUM A-D) 2 MG tablet Take 2 mg by mouth 3 (three) times daily as needed. Take 2 tabs by mouth once daily as needed for diarrhea.      . metFORMIN (GLUCOPHAGE) 500 MG tablet Take 1 tablet (500 mg total) by mouth 2 (two)  times daily with a meal.  180 tablet  3  . Oxycodone HCl 10 MG TABS Take 1 tablet (10 mg total) by mouth 3 (three) times daily.  90 tablet  0   No current facility-administered medications on file prior to visit.    Physical exam BP 146/90  Pulse 87  Temp(Src) 98.3 F (36.8 C) (Oral)  Resp 10  Ht 5\' 8"  (1.727 m)  Wt 180 lb 3.2 oz (81.738 kg)  BMI 27.41 kg/m2  SpO2 98%  LMP 12/14/2011  General- adult female in no acute distress Head- atraumatic, normocephalic Eyes- PERRLA, EOMI, no pallor, no icterus, no discharge Cardiovascular- normal s1,s2, no murmurs/ rubs/ gallops Respiratory- bilateral clear  to auscultation, no wheeze, no rhonchi, no crackles Abdomen- bowel sounds present, soft, distended abdomen, generalized discomfort but no guarding, rigidity or rebound tenderness Musculoskeletal- able to move all 4 extremities, no spinal and paraspinal tenderness Neurological- no focal deficit, decreased pinprick sensation upto mid leg region, normal vibration sense Psychiatry- alert and oriented to person, place and time, normal mood, anxious  Labs-  Lab Results  Component Value Date   HGBA1C 8.0* 09/19/2013   CMP     Component Value Date/Time   NA 141 09/19/2013 1135   NA 135 07/10/2012 1410   K 5.1 09/19/2013 1135   CL 99 09/19/2013 1135   CO2 24 09/19/2013 1135   GLUCOSE 166* 09/19/2013 1135   GLUCOSE 148* 07/10/2012 1410   BUN 5* 09/19/2013 1135   BUN 9 07/10/2012 1410   CREATININE 0.63 09/19/2013 1135   CALCIUM 10.4* 09/19/2013 1135   PROT 7.4 09/19/2013 1135   PROT 7.4 02/09/2012 1604   ALBUMIN 3.7 02/09/2012 1604   AST 22 09/19/2013 1135   ALT 34* 09/19/2013 1135   ALKPHOS 107 09/19/2013 1135   BILITOT 0.3 09/19/2013 1135   GFRNONAA 109 09/19/2013 1135   GFRAA 125 09/19/2013 1135    Assessment/plan  1. DM type 2, uncontrolled, with neuropathy Reviewed cbg. Continue metformin 500 mg bid, not to skip meals. Continue statin. Check urine microalbumin and consider ACE/ARB if required - Hemoglobin A1c; Future - Basic Metabolic Panel; Future  2. Irritable bowel syndrome Reviewed gi records for EGD and colonoscopy from 2011 s/o GERD and normal colonoscopy. Will start her on linzess 290 mcg daily- sample worth of 1 month provided with a script, instruction a dn common side effects explained  3. Essential hypertension, benign Continue atenolol and hctz for now.  pain is a confounding factor here  4. Neuropathic pain D/c lyrica. Continue gabapentin 600 mg tid for now. Continue oxycodone. Will consider genetic testing after her insurance is in place to assess for the best alternative for  nerve pain and chronic back pain  Blanchie Serve, MD  [place] [phone] (Monday-Friday 8 am - 5 pm) [phone] (afterhours)

## 2013-11-29 ENCOUNTER — Other Ambulatory Visit: Payer: Self-pay | Admitting: *Deleted

## 2013-11-29 MED ORDER — OXYCODONE HCL 10 MG PO TABS: 10.0000 mg | ORAL_TABLET | Freq: Three times a day (TID) | ORAL | Status: DC

## 2013-11-29 MED ORDER — ALPRAZOLAM 1 MG PO TABS: 1.0000 mg | ORAL_TABLET | Freq: Three times a day (TID) | ORAL | Status: DC

## 2013-12-15 ENCOUNTER — Other Ambulatory Visit: Payer: Self-pay

## 2013-12-19 ENCOUNTER — Ambulatory Visit (INDEPENDENT_AMBULATORY_CARE_PROVIDER_SITE_OTHER): Payer: No Typology Code available for payment source | Admitting: Internal Medicine

## 2013-12-19 ENCOUNTER — Encounter: Payer: Self-pay | Admitting: Internal Medicine

## 2013-12-19 VITALS — BP 132/84 | HR 87 | Temp 98.3°F | Wt 170.0 lb

## 2013-12-19 DIAGNOSIS — K589 Irritable bowel syndrome without diarrhea: Secondary | ICD-10-CM

## 2013-12-19 DIAGNOSIS — I10 Essential (primary) hypertension: Secondary | ICD-10-CM

## 2013-12-19 DIAGNOSIS — E1165 Type 2 diabetes mellitus with hyperglycemia: Principal | ICD-10-CM

## 2013-12-19 DIAGNOSIS — E1142 Type 2 diabetes mellitus with diabetic polyneuropathy: Secondary | ICD-10-CM

## 2013-12-19 DIAGNOSIS — IMO0002 Reserved for concepts with insufficient information to code with codable children: Secondary | ICD-10-CM

## 2013-12-19 DIAGNOSIS — Z5181 Encounter for therapeutic drug level monitoring: Secondary | ICD-10-CM

## 2013-12-19 DIAGNOSIS — E1149 Type 2 diabetes mellitus with other diabetic neurological complication: Secondary | ICD-10-CM

## 2013-12-19 DIAGNOSIS — E1140 Type 2 diabetes mellitus with diabetic neuropathy, unspecified: Secondary | ICD-10-CM

## 2013-12-19 DIAGNOSIS — F17200 Nicotine dependence, unspecified, uncomplicated: Secondary | ICD-10-CM

## 2013-12-19 DIAGNOSIS — M792 Neuralgia and neuritis, unspecified: Secondary | ICD-10-CM

## 2013-12-19 MED ORDER — PREGABALIN 50 MG PO CAPS: 50.0000 mg | ORAL_CAPSULE | Freq: Three times a day (TID) | ORAL | Status: DC

## 2013-12-19 NOTE — Progress Notes (Signed)
Patient ID: Denise Webb, female   DOB: 05-19-1966, 48 y.o.   MRN: [id number]    Chief Complaint  Patient presents with  . Medical Management of Chronic Issues    1 month folow-up on IBS, DM, and genetic testing, head congestion   Allergies  Allergen Reactions  . Erythromycin Stearate     REACTION: causes anxiety, heart to race  . Morphine And Related Nausea And Vomiting   HPI 48 y/o female patient is here for follow up Stopped linzess after 3 days due to worsening diarrhea and does not want to take it further. Her insurance service has started and genetic testing will be covered She is taking her diabetes medication. cbg ranging120-160. No hypoglycemia reported She continues to have pain in her legs. lyrica is helping some but pain persists She has history of neuropathy confirmed by EMG, irritable bowel syndrome and GAD. She is complaint with her medications Loose stool persists. No blood in stool Feels stuffed up in her head and sinuses. Continues to smoke. No cough, sore throat or runny nose   Review of Systems   Constitutional: Negative for fever, chills, weight loss, diaphoresis.   HENT: Negative for hearing loss and sore throat.    Eyes: Negative for blurred vision, double vision and discharge.   Respiratory: Negative for cough, sputum production, shortness of breath and wheezing.    Cardiovascular: Negative for chest pain, palpitations, orthopnea and leg swelling.   Gastrointestinal: Negative for heartburn. Has nausea and frequent loose stools with IBS flare up Genitourinary: Negative for dysuria, urgency, frequency and flank pain.   Musculoskeletal: Negative for falls Skin: Negative for itching and rash.   Neurological: Negative for dizziness, tingling, focal weakness and headaches.   Psychiatric/Behavioral: Negative for depression and memory loss. The patient is anxious.    Past Medical History  Diagnosis Date  . Hypertension   . IBS (irritable bowel syndrome)     . Arthritis   . Plantar fasciitis   . Anxiety   . Depression   . Neuropathy     severe both feet/lower legs  . Hyperlipidemia   . TMJ (dislocation of temporomandibular joint)   . IBS (irritable bowel syndrome) 2011-2014    ER visits only    Past Surgical History  Procedure Laterality Date  . Tubal ligation    . Cesarean section  1988    [facility]  . Pelvic exenteration  1990    Uterus Frozen (cancer)  . Tubes tied  2004/2005    [facility]   Medication reviewed. See [facility]  Physical exam BP 132/84  Pulse 87  Temp(Src) 98.3 F (36.8 C) (Oral)  Wt 170 lb (77.111 kg)  SpO2 97%  LMP 12/14/2011  General- adult female in no acute distress Head- atraumatic, normocephalic. No maxillary and frontal sinus tenderness Eyes- PERRLA, EOMI, no pallor, no icterus, no discharge Cardiovascular- normal s1,s2, no murmurs/ rubs/ gallops Respiratory- bilateral clear to auscultation, no wheeze, no rhonchi, no crackles Abdomen- bowel sounds present, soft, distended abdomen, generalized discomfort but no guarding, rigidity or rebound tenderness Musculoskeletal- able to move all 4 extremities, no spinal and paraspinal tenderness Neurological- no focal deficit, decreased pinprick sensation upto mid leg region, normal vibration sense Psychiatry- alert and oriented to person, place and time, normal mood, anxious  Labs-  Lab Results  Component Value Date   HGBA1C 8.0* 09/19/2013    Assessment/plan  1. Irritable bowel syndrome Gi referral for persistent loose stool and need for possible EGD and colonoscopy. Have tried imodium,  probiotics and linzess without much help. Has seen Dr Henrene Pastor in past. No records for review. - Ambulatory referral to Gastroenterology  2. Essential hypertension, benign Stable   3. DM type 2, uncontrolled, with neuropathy Continue metformin 500 mg bid, monitor cbg. continue lipitor and aspirin. Check a1c today - Hemoglobin A1c  4. TOBACCO ABUSE Patient could be  developing sinusitis and smoking would make this worse. Encouraged to stop smoking, pt feels she is under stress at present and feels this is not the right time to quit.   5. Neuropathic pain Increase lyrica to 50 mg tid for neuropathic pain  6. Encounter for therapeutic drug monitoring Will do genetic testing to help assess efficacy of the pain medication

## 2013-12-20 LAB — HEMOGLOBIN A1C
Est. average glucose Bld gHb Est-mCnc: 137 mg/dL
Hgb A1c MFr Bld: 6.4 % — ABNORMAL HIGH (ref 4.8–5.6)

## 2013-12-22 ENCOUNTER — Encounter: Payer: Self-pay | Admitting: *Deleted

## 2013-12-29 ENCOUNTER — Other Ambulatory Visit: Payer: Self-pay | Admitting: *Deleted

## 2013-12-29 MED ORDER — ALPRAZOLAM 1 MG PO TABS: 1.0000 mg | ORAL_TABLET | Freq: Three times a day (TID) | ORAL | Status: DC

## 2013-12-29 MED ORDER — OXYCODONE HCL 10 MG PO TABS: 10.0000 mg | ORAL_TABLET | Freq: Three times a day (TID) | ORAL | Status: DC

## 2014-01-04 ENCOUNTER — Other Ambulatory Visit: Payer: Self-pay | Admitting: *Deleted

## 2014-01-04 ENCOUNTER — Telehealth: Payer: Self-pay | Admitting: *Deleted

## 2014-01-04 MED ORDER — ATENOLOL 25 MG PO TABS: 25.0000 mg | ORAL_TABLET | Freq: Every day | ORAL | Status: DC

## 2014-01-04 MED ORDER — HYDROCHLOROTHIAZIDE 25 MG PO TABS: 25.0000 mg | ORAL_TABLET | Freq: Every day | ORAL | Status: DC

## 2014-01-04 NOTE — Telephone Encounter (Signed)
i will need to see the patient- we will need to do depression screening and blister exam. i can also review on lyrica nad gabapentin in that visit with the patient

## 2014-01-04 NOTE — Telephone Encounter (Signed)
Patient stated that she is not sleeping well and her old Dr. Prescribed Amitriptyline for her for this. Patient would like to know if we can prescribe, she has not had to have it in awhile. Please Advise.  [facility] also is not going to cover Lyrica and will cover Gabapentin. Patient stated that she has tried this in the past and willing to go back on it. Please Advise  Patient also stated that she has been getting blisters on ankles and legs swelling. Informed patient that we needed to make an appointment for this and patient stated that she would just watch it over the weekend and call us next week if she thinks she needs an appointment. Told her not to wait, with her being a diabetic. She agreed.

## 2014-01-05 NOTE — Telephone Encounter (Signed)
Patient Notified and scheduled an appointment

## 2014-01-09 ENCOUNTER — Ambulatory Visit (INDEPENDENT_AMBULATORY_CARE_PROVIDER_SITE_OTHER): Payer: No Typology Code available for payment source | Admitting: Internal Medicine

## 2014-01-09 ENCOUNTER — Encounter: Payer: Self-pay | Admitting: Internal Medicine

## 2014-01-09 VITALS — BP 128/80 | HR 74 | Temp 98.6°F | Resp 10 | Wt 174.4 lb

## 2014-01-09 DIAGNOSIS — G5793 Unspecified mononeuropathy of bilateral lower limbs: Secondary | ICD-10-CM

## 2014-01-09 DIAGNOSIS — F32A Depression, unspecified: Secondary | ICD-10-CM

## 2014-01-09 DIAGNOSIS — F3289 Other specified depressive episodes: Secondary | ICD-10-CM

## 2014-01-09 DIAGNOSIS — IMO0002 Reserved for concepts with insufficient information to code with codable children: Secondary | ICD-10-CM

## 2014-01-09 DIAGNOSIS — L600 Ingrowing nail: Secondary | ICD-10-CM | POA: Insufficient documentation

## 2014-01-09 DIAGNOSIS — F329 Major depressive disorder, single episode, unspecified: Secondary | ICD-10-CM

## 2014-01-09 DIAGNOSIS — S90829A Blister (nonthermal), unspecified foot, initial encounter: Secondary | ICD-10-CM

## 2014-01-09 DIAGNOSIS — G609 Hereditary and idiopathic neuropathy, unspecified: Secondary | ICD-10-CM

## 2014-01-09 MED ORDER — GABAPENTIN 300 MG PO CAPS: 300.0000 mg | ORAL_CAPSULE | Freq: Three times a day (TID) | ORAL | Status: DC

## 2014-01-09 NOTE — Progress Notes (Signed)
Patient ID: Denise Webb, female   DOB: Jun 20, 1966, 48 y.o.   MRN: [id number]    Chief Complaint  Patient presents with  . Acute Visit    leg swelling , blisters in feet, lyrica cost issue, depressed   HPI 48 y/o female pt is here for AV. She had noticed swelling in both her legs and blisters on both her feet last week. The swelling has now subsided and 2 blisters that she had in her heel area have popped and one ocassionally drains clear liquid while other is drying up. Her cbg 119-162 Complaint with her medication Patient is not able to afford lyrica due to cost reason and would like to be on gabapentin Her mood has been low. She has a lot of financial and family stress at present  ROS No fever or chills No loose stool at present No suicidal ideation No falls reported  Past Medical History  Diagnosis Date  . Hypertension   . IBS (irritable bowel syndrome)   . Arthritis   . Plantar fasciitis   . Anxiety   . Depression   . Neuropathy     severe both feet/lower legs  . Hyperlipidemia   . TMJ (dislocation of temporomandibular joint)   . IBS (irritable bowel syndrome) 2011-2014    ER visits only    Current Outpatient Prescriptions on File Prior to Visit  Medication Sig Dispense Refill  . ALPRAZolam (XANAX) 1 MG tablet Take 1 tablet (1 mg total) by mouth 3 (three) times daily.  90 tablet  0  . aspirin-acetaminophen-caffeine (EXCEDRIN MIGRAINE) 644-034-74 MG per tablet Take by mouth every 6 (six) hours as needed for headache.      Marland Kitchen atenolol (TENORMIN) 25 MG tablet Take 1 tablet (25 mg total) by mouth daily.  90 tablet  3  . atorvastatin (LIPITOR) 10 MG tablet Take 1 tablet (10 mg total) by mouth daily.  90 tablet  3  . glucose blood test strip 1 each by Other route as needed for other. Reli on prime: check blood sugar twice daily DX: 250.62      . hydrochlorothiazide (HYDRODIURIL) 25 MG tablet Take 1 tablet (25 mg total) by mouth daily.  90 tablet  3  . Lactobacillus  (ACIDOPHILUS PROBIOTIC) 100 MG CAPS Take 1 capsule (100 mg total) by mouth daily.  30 capsule  3  . loperamide (IMODIUM A-D) 2 MG tablet Take 2 mg by mouth 3 (three) times daily as needed. Take 2 tabs by mouth once daily as needed for diarrhea.      . metFORMIN (GLUCOPHAGE) 500 MG tablet Take 1 tablet (500 mg total) by mouth 2 (two) times daily with a meal.  180 tablet  3  . Oxycodone HCl 10 MG TABS Take 1 tablet (10 mg total) by mouth 3 (three) times daily.  90 tablet  0  . promethazine (PHENERGAN) 25 MG tablet Take 1 tablet (25 mg total) by mouth every 6 (six) hours as needed for nausea or vomiting (For nausea and vomiting).  30 tablet  0   No current facility-administered medications on file prior to visit.   Physical exam BP 128/80  Pulse 74  Temp(Src) 98.6 F (37 C) (Oral)  Resp 10  Wt 174 lb 6.4 oz (79.107 kg)  SpO2 98%  LMP 12/14/2011  General- adult female in no acute distress Head- atraumatic, normocephalic. No maxillary and frontal sinus tenderness Eyes- PERRLA, EOMI, no pallor, no icterus, no discharge Cardiovascular- normal s1,s2, no murmurs/ rubs/ gallops  Respiratory- bilateral clear to auscultation, no wheeze, no rhonchi, no crackles Abdomen- bowel sounds present, soft Musculoskeletal- able to move all 4 extremities, no spinal and paraspinal tenderness, no leg edema, ingrown toe nail in both feet Neurological- no focal deficit, decreased pinprick sensation upto mid leg region, normal vibration sense Skin-a dry skin scab on left heel and a blister on right heel with skin partially peeling off, no drainage at visit Psychiatry- alert and oriented to person, place and time, normal mood, anxious   01/09/14 phq9 score 14  Lab Results  Component Value Date   HGBA1C 6.4* 12/19/2013   Assessment/plan  1. Depression Patient refuses medication treatment at present. Counseled her on seeking help with psychologist, behavioral modification, medication treatment option. Also  counselled on warning signs with worsening mood disorder. Pt agrees to talk about it if needed  2. Blister of foot without infection i am thinking this could be from her shoes, it is resolving by itself. Advised on foot care with hx of dm and neuropathy. Advised to wear shoes with open end to help with ealing of the blister.  3. Neuropathic pain of both legs D/c lyrica. Will have her on gabapentin 300 mg tid for pain. Monitor cbg and continue metformin. Fall precautions. Genetic testing to help assess efficacy of medication on her done today  4. Ingrown toe nail Podiatry referral

## 2014-01-16 ENCOUNTER — Ambulatory Visit (INDEPENDENT_AMBULATORY_CARE_PROVIDER_SITE_OTHER): Payer: No Typology Code available for payment source | Admitting: Internal Medicine

## 2014-01-16 ENCOUNTER — Encounter: Payer: Self-pay | Admitting: Internal Medicine

## 2014-01-16 VITALS — BP 130/84 | HR 80 | Temp 98.2°F | Wt 175.0 lb

## 2014-01-16 DIAGNOSIS — E1165 Type 2 diabetes mellitus with hyperglycemia: Secondary | ICD-10-CM

## 2014-01-16 DIAGNOSIS — E1140 Type 2 diabetes mellitus with diabetic neuropathy, unspecified: Secondary | ICD-10-CM

## 2014-01-16 DIAGNOSIS — J069 Acute upper respiratory infection, unspecified: Secondary | ICD-10-CM

## 2014-01-16 DIAGNOSIS — E1142 Type 2 diabetes mellitus with diabetic polyneuropathy: Secondary | ICD-10-CM

## 2014-01-16 DIAGNOSIS — S90829A Blister (nonthermal), unspecified foot, initial encounter: Secondary | ICD-10-CM

## 2014-01-16 DIAGNOSIS — I10 Essential (primary) hypertension: Secondary | ICD-10-CM

## 2014-01-16 DIAGNOSIS — IMO0002 Reserved for concepts with insufficient information to code with codable children: Secondary | ICD-10-CM

## 2014-01-16 DIAGNOSIS — E1149 Type 2 diabetes mellitus with other diabetic neurological complication: Secondary | ICD-10-CM

## 2014-01-16 MED ORDER — SULFAMETHOXAZOLE-TMP DS 800-160 MG PO TABS: ORAL_TABLET | ORAL | Status: DC

## 2014-01-16 MED ORDER — TRIPLE ANTIBIOTIC 5-400-5000 EX OINT: TOPICAL_OINTMENT | CUTANEOUS | Status: DC

## 2014-01-16 MED ORDER — DM-GUAIFENESIN ER 30-600 MG PO TB12: ORAL_TABLET | ORAL | Status: DC

## 2014-01-16 MED ORDER — TRIAMCINOLONE ACETONIDE 55 MCG/ACT NA AERO: INHALATION_SPRAY | NASAL | Status: AC

## 2014-01-16 NOTE — Progress Notes (Signed)
Patient ID: Denise Webb, female   DOB: [birth year], 48 y.o.   MRN: [id number]    Location:    Place of Service:     Allergies  Allergen Reactions  . Erythromycin Stearate     REACTION: causes anxiety, heart to race  . Morphine And Related Nausea And Vomiting    Chief Complaint  Patient presents with  . head & chest congestion    for 5 days, mucus yellow & Denise Webb, Tender in face, teeth hurt  . Wound Check    on right heel hasn't gotten any better, appt with Podiarist June 2nd  . Results    wants results from the ID [facility] test     HPI:  Acute onset of upper respiratory tract infections about 5 days ago. She has a cough, mild sore throat, ear congestion, mild loss of hearing, and sinus congestion which is associated with tenderness in the right cheek. She has had previous sinus infections. She has a Denise Webb sputum. There has not been a significant fever.  Patient had genetic testing for pharmacogenetics counseling. It shows a relative contraindication to the proximal and in several categories. I discussed this with her. She has tried meloxicam and other medications which might be better alternatives, but does not want to switch.  Diabetes is under reasonably good control with home sugars running about 120+ or -20 points.  Patient has diabetic neuropathy which she claims is rapidly progressive. She was on gabapentin. She was given rather high doses. She was switched for a month to Lyrica. The insurance company bulkhead providing this medication. Patient says that she was doing better on gabapentin and does not want to use Lyrica.  Medications: Patient's Medications  New Prescriptions   No medications on file  Previous Medications   ALPRAZOLAM (XANAX) 1 MG TABLET    Take 1 tablet (1 mg total) by mouth 3 (three) times daily.   ASPIRIN-ACETAMINOPHEN-CAFFEINE (EXCEDRIN MIGRAINE) 250-250-65 MG PER TABLET    Take by mouth every 6 (six) hours as needed for headache.   ATENOLOL  (TENORMIN) 25 MG TABLET    Take 1 tablet (25 mg total) by mouth daily.   ATORVASTATIN (LIPITOR) 10 MG TABLET    Take 1 tablet (10 mg total) by mouth daily.   GABAPENTIN (NEURONTIN) 300 MG CAPSULE    Take 1 capsule (300 mg total) by mouth 3 (three) times daily.   GLUCOSE BLOOD TEST STRIP    1 each by Other route as needed for other. Reli on prime: check blood sugar twice daily DX: 250.62   HYDROCHLOROTHIAZIDE (HYDRODIURIL) 25 MG TABLET    Take 1 tablet (25 mg total) by mouth daily.   LACTOBACILLUS (ACIDOPHILUS PROBIOTIC) 100 MG CAPS    Take 1 capsule (100 mg total) by mouth daily.   LOPERAMIDE (IMODIUM A-D) 2 MG TABLET    Take 2 mg by mouth 3 (three) times daily as needed. Take 2 tabs by mouth once daily as needed for diarrhea.   METFORMIN (GLUCOPHAGE) 500 MG TABLET    Take 1 tablet (500 mg total) by mouth 2 (two) times daily with a meal.   OXYCODONE HCL 10 MG TABS    Take 1 tablet (10 mg total) by mouth 3 (three) times daily.   PROMETHAZINE (PHENERGAN) 25 MG TABLET    Take 1 tablet (25 mg total) by mouth every 6 (six) hours as needed for nausea or vomiting (For nausea and vomiting).  Modified Medications   No medications on file  Discontinued  Medications   No medications on file     Review of Systems  Constitutional: Negative for fever.  HENT: Positive for ear pain, hearing loss, postnasal drip, sinus pressure and sore throat. Negative for trouble swallowing and voice change.   Eyes: Negative.   Respiratory: Positive for cough. Negative for chest tightness, shortness of breath and wheezing.   Cardiovascular: Positive for leg swelling. Negative for chest pain and palpitations.  Gastrointestinal: Negative for nausea, abdominal pain, diarrhea and abdominal distention.  Endocrine: Positive for polyphagia.       Diabetic with neuropathy  Musculoskeletal: Positive for arthralgias, back pain and myalgias.  Skin:       Small ulcer on the right heel laterally which appears to been brought on by  wearing an appropriate foot wear. There is a small healing ulcer of the left heel. Both areas are relatively tender and are surrounded by some erythema. There does not appear to be any drainage from the ulcer of the right heel.  Neurological:       Diabetic neuropathy of both feet and lower legs. There is diminished monofilament sensitivity and diminished vibratory sensation sensitivity.  Hematological: Negative.   Psychiatric/Behavioral: Negative.     Filed Vitals:   01/16/14 1503  BP: 130/84  Pulse: 80  Temp: 98.2 F (36.8 C)  Weight: 175 lb (79.379 kg)   Body mass index is 26.61 kg/(m^2).  Physical Exam  Constitutional: She is oriented to person, place, and time. She appears well-developed and well-nourished. No distress.  HENT:  Right Ear: External ear normal.  Left Ear: External ear normal.  Nose: Nose normal.  Mouth/Throat: No oropharyngeal exudate.  Mild erythema of the oropharynx without exudate.  Cardiovascular: Normal rate, regular rhythm, normal heart sounds and intact distal pulses.  Exam reveals no gallop and no friction rub.   No murmur heard. Pulmonary/Chest: No respiratory distress. She has no wheezes. She has no rales. She exhibits no tenderness.  Abdominal: She exhibits no distension and no mass. There is no tenderness.  Musculoskeletal: Normal range of motion. She exhibits edema and tenderness.  Neurological: She is alert and oriented to person, place, and time. No cranial nerve deficit. Coordination normal.  Skin:  Active ulceration of the right heel which appears to be healing. Healing ulcer of the left heel.  Psychiatric: She has a normal mood and affect. Her behavior is normal. Judgment and thought content normal.     Labs reviewed: Office Visit on 12/19/2013  Component Date Value Ref Range Status  . Hemoglobin A1C 12/19/2013 6.4* 4.8 - 5.6 % Final   Comment:          Increased risk for diabetes: 5.7 - 6.4                                   Diabetes:  >6.4                                   Glycemic control for adults with diabetes: <7.0  . Estimated average glucose 12/19/2013 137   Final  Abstract on 11/14/2013  Component Date Value Ref Range Status  . HM Colonoscopy 11/13/2009 nl,  repeat 10 yrs   Final      Assessment/Plan  1. Acute URI - sulfamethoxazole-trimethoprim (BACTRIM DS) 800-160 MG per tablet; One twice daily for infection  Dispense: 20 tablet;  Refill: 0 - triamcinolone (NASACORT AQ) 55 MCG/ACT AERO nasal inhaler; 2 sprays in each nostril daily  Dispense: 1 Inhaler; Refill: 1 - dextromethorphan-guaiFENesin (MUCINEX DM) 30-600 MG per 12 hr tablet; One twice daly for congestion  Dispense: 20 tablet; Refill: 1  2. Essential hypertension, benign controlled  3. DM type 2, uncontrolled, with neuropathy controlled  4. Blister of foot without infection - neomycin-bacitracin-polymyxin (NEOSPORIN) [phone] ointment; Apply daily to sores on feet  Dispense: 28.3 g; Refill: 0 - sulfamethoxazole-trimethoprim (BACTRIM DS) 800-160 MG per tablet; One twice daily for infection  Dispense: 20 tablet; Refill: 0

## 2014-01-16 NOTE — Patient Instructions (Signed)
Use medications as listed 

## 2014-01-23 ENCOUNTER — Telehealth: Payer: Self-pay | Admitting: *Deleted

## 2014-01-23 ENCOUNTER — Ambulatory Visit (INDEPENDENT_AMBULATORY_CARE_PROVIDER_SITE_OTHER): Payer: No Typology Code available for payment source | Admitting: Podiatry

## 2014-01-23 ENCOUNTER — Encounter: Payer: Self-pay | Admitting: Podiatry

## 2014-01-23 VITALS — BP 118/71 | HR 91 | Resp 16 | Ht 68.0 in | Wt 173.0 lb

## 2014-01-23 DIAGNOSIS — L600 Ingrowing nail: Secondary | ICD-10-CM

## 2014-01-23 DIAGNOSIS — M79609 Pain in unspecified limb: Secondary | ICD-10-CM

## 2014-01-23 NOTE — Telephone Encounter (Signed)
Patient states that the Neurontin 300mg  tid is not strong enough and would like it increased. Also stated that she had a swab done and wants to know if the Oxycodone can be Increased because its not working. Please Advise

## 2014-01-23 NOTE — Progress Notes (Signed)
Subjective:     Patient ID: Denise Webb, female   DOB: 1966-06-04, 48 y.o.   MRN: [id number]  HPI patient is found to have very painful ingrown big toenails and second toenails of both feet with the big toenails being more tender and she tries to cut them out herself but is not able to do it. She states that she has diabetes but it's been under excellent control with her last A1c being 6.4   Review of Systems  All other systems reviewed and are negative.      Objective:   Physical Exam  Nursing note and vitals reviewed. Constitutional: She is oriented to person, place, and time.  Cardiovascular: Intact distal pulses.   Musculoskeletal: Normal range of motion.  Neurological: She is oriented to person, place, and time.  Skin: Skin is warm.   vascular status is intact with significant diminishment of sharp dull and vibratory which she has had for a fairly long time. Her toes are very well perfused in warm with good digital hair growth and I noted the hallux nails of both feet are incurvated and sore when pressed with an inability for her to cut them. The second nails are thickened not as painful but also hard to cut. 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 She states she cannot take care of them anymore she wants them removed and she understands the risks of surgery. I reviewed with her surgery to remove ingrown toenails and all possible complications occurring with it and she understands this wants procedures and gives permission today. I infiltrated 60 mg Xylocaine Marcaine mixture into each hallux and  then remove the hallux nails with sterile instrumentation exposing the matrix and applied phenol 4 applications 30 seconds to the matrix and applied alcohol lavaged after this and sterile dressings. Gave instructions on soaks and reappoint in 2 weeks or I may decide to remove the second nails depending on how quickly she is healing. Encouraged to call if any issues should occur

## 2014-01-23 NOTE — Progress Notes (Signed)
   Subjective:    Patient ID: Denise Webb, female    DOB: 05-Oct-1965, 48 y.o.   MRN: [id number]  HPI Comments: i have ingrown toenails on both of my big toenails and these places on both of my heels. i have had them for 1 month. They are painful. They are thick and discolored. My husband and i take care of my nails.      Review of Systems  Constitutional: Positive for fatigue and unexpected weight change.  HENT: Positive for sinus pressure and trouble swallowing.   Eyes: Positive for visual disturbance.  Cardiovascular: Positive for palpitations and leg swelling.  Gastrointestinal: Positive for abdominal pain and diarrhea.       Bloating  Endocrine:       Excessive thirst  Musculoskeletal: Positive for back pain.       Difficulty walking  Skin:       Open sores Change in nails  Neurological: Positive for weakness.  Hematological:       Slow to heal  All other systems reviewed and are negative.      Objective:   Physical Exam        Assessment & Plan:

## 2014-01-23 NOTE — Patient Instructions (Signed)

## 2014-01-23 NOTE — Telephone Encounter (Signed)
Is her genetic testing result back? If yes, make appointment to review and discuss result

## 2014-01-24 NOTE — Telephone Encounter (Signed)
Patient is also calling back wondering about the increases on her medication and stated that she also forgot to mention the Amitripyline. Stated that you told her that you would also increase this. Please Advise.

## 2014-01-24 NOTE — Telephone Encounter (Signed)
Please refer to Dr. Rolly Salter office note on 01/16/2014. It was discussed at that appointment.

## 2014-01-25 ENCOUNTER — Other Ambulatory Visit: Payer: Self-pay | Admitting: *Deleted

## 2014-01-25 MED ORDER — ALPRAZOLAM 1 MG PO TABS: 1.0000 mg | ORAL_TABLET | Freq: Three times a day (TID) | ORAL | Status: DC

## 2014-01-25 MED ORDER — OXYCODONE HCL 10 MG PO TABS: 10.0000 mg | ORAL_TABLET | Freq: Three times a day (TID) | ORAL | Status: DC

## 2014-01-25 NOTE — Telephone Encounter (Signed)
Patient wanted Rx written for her old Oxy that is due. She stated that she would just continue to use this until after she see's Dr. Bubba Camp on Tuesday.

## 2014-01-26 NOTE — Telephone Encounter (Signed)
i will review this with patient on Tuesday in the office. Please have the genetic testing result in chart- i dont see it scanned in the media section yet. thanks

## 2014-01-30 ENCOUNTER — Ambulatory Visit (INDEPENDENT_AMBULATORY_CARE_PROVIDER_SITE_OTHER): Payer: No Typology Code available for payment source | Admitting: Internal Medicine

## 2014-01-30 ENCOUNTER — Encounter: Payer: Self-pay | Admitting: Internal Medicine

## 2014-01-30 VITALS — BP 120/80 | HR 96 | Temp 99.2°F | Resp 20 | Ht 68.0 in | Wt 172.2 lb

## 2014-01-30 DIAGNOSIS — G609 Hereditary and idiopathic neuropathy, unspecified: Secondary | ICD-10-CM

## 2014-01-30 DIAGNOSIS — G5793 Unspecified mononeuropathy of bilateral lower limbs: Secondary | ICD-10-CM

## 2014-01-30 DIAGNOSIS — M549 Dorsalgia, unspecified: Secondary | ICD-10-CM

## 2014-01-30 MED ORDER — OXYCODONE HCL 20 MG PO TABS: 20.0000 mg | ORAL_TABLET | Freq: Three times a day (TID) | ORAL | Status: DC

## 2014-01-30 NOTE — Progress Notes (Signed)
Patient ID: Denise Webb, female   DOB: 1966/06/16, 48 y.o.   MRN: [id number]    Chief Complaint  Patient presents with  . Follow-up    medication management   Allergies  Allergen Reactions  . Erythromycin Stearate     REACTION: causes anxiety, heart to race  . Morphine And Related Nausea And Vomiting   HPI 48 y/o female patient here for follow up on her pain medication. She is on oxycodone 10 mg tid and mentions that this is not helping with her pain. She is also on gabapentin 300 mg tid. She has seen podiatry and had her   Wt Readings from Last 3 Encounters:  01/30/14 172 lb 3.2 oz (78.109 kg)  01/23/14 173 lb (78.472 kg)  01/16/14 175 lb (79.379 kg)   ROS No fever or chills No known trauma Mood stable No chest pain or dyspnea Continues to smoke  Physical exam BP 120/80  Pulse 96  Temp(Src) 99.2 F (37.3 C) (Oral)  Resp 20  Ht 5\' 8"  (1.727 m)  Wt 172 lb 3.2 oz (78.109 kg)  BMI 26.19 kg/m2  SpO2 90%  LMP 12/14/2011  General- adult female in no acute distress Head- atraumatic, normocephalic. No maxillary and frontal sinus tenderness Eyes- PERRLA, EOMI, no pallor, no icterus, no discharge Cardiovascular- normal s1,s2, no murmurs/ rubs/ gallops Respiratory- bilateral clear to auscultation, no wheeze, no rhonchi, no crackles Abdomen- bowel sounds present, soft Musculoskeletal- able to move all 4 extremities Neurological- no focal deficit Psychiatry- alert and oriented to person, place and time, normal mood, anxious  labs Lab Results  Component Value Date   HGBA1C 6.4* 12/19/2013   cbg this am 137  Assessment/plan  1. BACK PAIN, CHRONIC With current regimen of oxycodone not helping her, will increase oxycodone to 20 mg tid for now and reassess in few weeks. Will get urine drug screen to assess for medication complaince  2. Neuropathic pain of both legs In setting of her diabetes and possible back issues. On gabapentin 300 mg tid. Will not increase dose  of it at present as going up on oxycodone. Will increase the dose on next visit if needed.  Reviewed genetic testing result and patient metabolizes both oxycodone and gabapentin adequately in her system

## 2014-02-05 ENCOUNTER — Encounter: Payer: Self-pay | Admitting: Internal Medicine

## 2014-02-06 ENCOUNTER — Encounter: Payer: Self-pay | Admitting: Podiatry

## 2014-02-06 ENCOUNTER — Ambulatory Visit (INDEPENDENT_AMBULATORY_CARE_PROVIDER_SITE_OTHER): Payer: No Typology Code available for payment source | Admitting: Podiatry

## 2014-02-06 DIAGNOSIS — L600 Ingrowing nail: Secondary | ICD-10-CM

## 2014-02-06 NOTE — Progress Notes (Signed)
Subjective:     Patient ID: Denise Webb, female   DOB: 01/10/1966, 48 y.o.   MRN: [id number]  HPI patient states my big toenails are doing great and I am ready to get my second nails removed   Review of Systems     Objective:   Physical Exam Neurovascular status is intact with hallux nails of both feet which are healing beautifully with minimal scab tissue and minimal erythema noted. Second nails are very incurvated and painful when pressed dorsally    Assessment:     Well-healing hallux nail sites both feet with chronic ingrown toenail second nails of both feet    Plan:     At this time I went ahead and discussed correction that she wants done and I explained the risk of doing this. She is willing to accept risk and today I infiltrated 60 mg like Marcaine mixture remove each second nail exposed matrix and applied phenol or applications 30 seconds followed by alcohol lavaged and sterile dressing. Gave instructions on soaks and reappoint

## 2014-02-06 NOTE — Patient Instructions (Signed)

## 2014-02-12 ENCOUNTER — Encounter: Payer: Self-pay | Admitting: Internal Medicine

## 2014-02-13 ENCOUNTER — Telehealth: Payer: Self-pay | Admitting: Internal Medicine

## 2014-02-13 ENCOUNTER — Ambulatory Visit: Payer: Self-pay | Admitting: Internal Medicine

## 2014-02-13 NOTE — Telephone Encounter (Signed)
No. Thank you.

## 2014-02-22 ENCOUNTER — Other Ambulatory Visit: Payer: Self-pay

## 2014-02-22 MED ORDER — ALPRAZOLAM 1 MG PO TABS: 1.0000 mg | ORAL_TABLET | Freq: Three times a day (TID) | ORAL | Status: DC

## 2014-02-22 NOTE — Telephone Encounter (Signed)
Rx called in 

## 2014-02-27 ENCOUNTER — Encounter: Payer: Self-pay | Admitting: Internal Medicine

## 2014-02-27 ENCOUNTER — Ambulatory Visit (INDEPENDENT_AMBULATORY_CARE_PROVIDER_SITE_OTHER): Payer: No Typology Code available for payment source | Admitting: Internal Medicine

## 2014-02-27 ENCOUNTER — Other Ambulatory Visit: Payer: Self-pay | Admitting: Internal Medicine

## 2014-02-27 VITALS — BP 110/70 | HR 112 | Temp 98.1°F | Resp 18 | Ht 68.0 in | Wt 167.0 lb

## 2014-02-27 DIAGNOSIS — IMO0002 Reserved for concepts with insufficient information to code with codable children: Secondary | ICD-10-CM

## 2014-02-27 DIAGNOSIS — F17200 Nicotine dependence, unspecified, uncomplicated: Secondary | ICD-10-CM

## 2014-02-27 DIAGNOSIS — I10 Essential (primary) hypertension: Secondary | ICD-10-CM

## 2014-02-27 DIAGNOSIS — M549 Dorsalgia, unspecified: Secondary | ICD-10-CM

## 2014-02-27 DIAGNOSIS — G5793 Unspecified mononeuropathy of bilateral lower limbs: Secondary | ICD-10-CM

## 2014-02-27 DIAGNOSIS — Z1231 Encounter for screening mammogram for malignant neoplasm of breast: Secondary | ICD-10-CM

## 2014-02-27 DIAGNOSIS — E1165 Type 2 diabetes mellitus with hyperglycemia: Principal | ICD-10-CM

## 2014-02-27 DIAGNOSIS — E1140 Type 2 diabetes mellitus with diabetic neuropathy, unspecified: Secondary | ICD-10-CM

## 2014-02-27 DIAGNOSIS — E1149 Type 2 diabetes mellitus with other diabetic neurological complication: Secondary | ICD-10-CM

## 2014-02-27 DIAGNOSIS — E2839 Other primary ovarian failure: Secondary | ICD-10-CM

## 2014-02-27 DIAGNOSIS — E1142 Type 2 diabetes mellitus with diabetic polyneuropathy: Secondary | ICD-10-CM

## 2014-02-27 DIAGNOSIS — G609 Hereditary and idiopathic neuropathy, unspecified: Secondary | ICD-10-CM

## 2014-02-27 DIAGNOSIS — Z23 Encounter for immunization: Secondary | ICD-10-CM

## 2014-02-27 MED ORDER — PREGABALIN 150 MG PO CAPS: 150.0000 mg | ORAL_CAPSULE | Freq: Two times a day (BID) | ORAL | Status: DC

## 2014-02-27 NOTE — Progress Notes (Signed)
Patient ID: Denise Webb, female   DOB: 28-Apr-1966, 48 y.o.   MRN: [id number]    Chief Complaint  Patient presents with  . Follow-up   Allergies  Allergen Reactions  . Erythromycin Stearate     REACTION: causes anxiety, heart to race  . Morphine And Related Nausea And Vomiting   HPI 48 y/o female pt here for follow up. Her pain is under better control with the higher dose of oxycodone. Her gabapentin is not helping much with her nerve pain.   Immunization History  Administered Date(s) Administered  . Influenza Whole 07/01/2010  . Tdap 04/14/2011   cbg this am 165 cbg average between 150-180. No reading greater than 200 Complaint with her med Has been seeing her podiatrist Continues to smoke  Review of Systems   Constitutional: Negative for fever, chills, weight loss, diaphoresis.   HENT: Negative for hearing loss and sore throat.    Eyes: Negative for blurred vision, double vision and discharge.   Respiratory: Negative for cough, sputum production, shortness of breath and wheezing.    Cardiovascular: Negative for chest pain, palpitations, orthopnea and leg swelling.   Gastrointestinal: Negative for heartburn, nausea or vomiting at present Genitourinary: Negative for dysuria, urgency, frequency and flank pain.   Musculoskeletal: Negative for falls Skin: Negative for itching and rash.   Neurological: Negative for dizziness, tingling, focal weakness and headaches.   Psychiatric/Behavioral: Negative for memory loss. The patient is anxious.      Past Medical History  Diagnosis Date  . Hypertension   . Colitis   . Plantar fasciitis   . Anxiety   . Depression   . Neuropathy     severe both feet/lower legs  . Hyperlipidemia   . TMJ (dislocation of temporomandibular joint)   . IBS (irritable bowel syndrome) 2011-2014    ER visits only   . GERD (gastroesophageal reflux disease)   . Internal hemorrhoids    Medication reviewed. See [facility]  Physical exam BP 110/70   Pulse 112  Temp(Src) 98.1 F (36.7 C) (Oral)  Resp 18  Ht 5\' 8"  (1.727 m)  Wt 167 lb (75.751 kg)  BMI 25.40 kg/m2  SpO2 98%  LMP 12/14/2011  General- adult female in no acute distress Head- atraumatic, normocephalic. No maxillary and frontal sinus tenderness Eyes- PERRLA, EOMI, no pallor, no icterus, no discharge Cardiovascular- normal s1,s2, no murmurs/ rubs/ gallops Respiratory- bilateral clear to auscultation, no wheeze, no rhonchi, no crackles Abdomen- bowel sounds present, soft, non tender Musculoskeletal- able to move all 4 extremities, no spinal and paraspinal tenderness, nails in foot removed and healing well Neurological- no focal deficit, decreased pinprick sensation upto mid leg region, normal vibration sense, tenderness to light touch Psychiatry- alert and oriented to person, place and time, normal mood  Lab Results  Component Value Date   HGBA1C 6.4* 12/19/2013   Lipid Panel     Component Value Date/Time   CHOL 230* 08/08/2009 1947   TRIG 555* 09/19/2013 1135   HDL 38* 09/19/2013 1135   HDL 44 08/08/2009 1947   CHOLHDL 7.4* 09/19/2013 1135   CHOLHDL 5.2 Ratio 08/08/2009 1947   VLDL 78* 08/08/2009 1947   LDLCALC Comment 09/19/2013 1135   LDLCALC 108* 08/08/2009 1947   Assessment/plan  1. DM type 2, uncontrolled, with neuropathy Foot exam done today. Continue metformin 500 mg bid, monitor cbg. continue lipitor and aspirin.  - Ambulatory referral to Ophthalmology - Microalbumin/Creatinine Ratio, Urine - pneumococcal vaccine  2. Essential hypertension, benign Stable.continue atenolol and  hctz. Monitor bp.   3. Neuropathic pain of both legs Will d/c her gabapentin with no therapeutic benefit. Will provide sample on lyrica 150 mg bid (we have sample on 75 mg- so take 2 in am and 2 in pm). Also send script to pharmacy. Will priorauthorize it if needed  4. TOBACCO ABUSE Encouraged to stop smoking- explained risk of atherosclerosis, PVD, PAD. Pt understands  this  5. BACK PAIN, CHRONIC Continue current regimen of oxycodone  6. Estrogen deficiency - MM Digital Screening; Future

## 2014-02-28 LAB — MICROALBUMIN / CREATININE URINE RATIO
Creatinine, Ur: 72.1 mg/dL (ref 15.0–278.0)
MICROALB/CREAT RATIO: <4.2 mg/g creat (ref 0.0–30.0)
Microalbumin, Urine: <3 ug/mL (ref 0.0–17.0)

## 2014-03-02 ENCOUNTER — Encounter: Payer: Self-pay | Admitting: *Deleted

## 2014-03-05 ENCOUNTER — Telehealth: Payer: Self-pay | Admitting: *Deleted

## 2014-03-05 NOTE — Telephone Encounter (Signed)
Patient called wanting their pain medication Oxy filled. In the system it is not due until 03/09/2014. Patient said you told her at her last visit with you she could pick it up 3 days early, so she is calling to pick it up tomorrow. Please Advise.

## 2014-03-05 NOTE — Telephone Encounter (Signed)
It is ok to provide for this time

## 2014-03-06 MED ORDER — OXYCODONE HCL 20 MG PO TABS
ORAL_TABLET | ORAL | Status: DC
Start: 2014-03-06 — End: 2014-04-05

## 2014-03-06 NOTE — Telephone Encounter (Signed)
Printed Rx and notified patient to pick up

## 2014-03-07 ENCOUNTER — Encounter: Payer: Self-pay | Admitting: Internal Medicine

## 2014-03-08 ENCOUNTER — Ambulatory Visit: Payer: Self-pay

## 2014-03-14 ENCOUNTER — Telehealth: Payer: Self-pay | Admitting: *Deleted

## 2014-03-14 NOTE — Telephone Encounter (Signed)
Patient called and wanted Rx's for Lyrica, Phenergan and Amitriptyline which she took in the past as needed for rest. (150mg  up to 2 times a night as needed). Is this ok to call in? The Amitriptyline has never been prescribed by [place], but her past Dr. Please Advise.  Pharmacy [place]

## 2014-03-14 NOTE — Telephone Encounter (Signed)
lyrica 150 mg bid is ok  No amitriptyline  Phenergan prn for nausea is ok - 12.5 mg every 8 hour as needed for nausea/vomiting- a month supply only. She is supposed to see GI

## 2014-03-15 ENCOUNTER — Other Ambulatory Visit: Payer: Self-pay | Admitting: *Deleted

## 2014-03-15 MED ORDER — PROMETHAZINE HCL 12.5 MG PO TABS: 12.5000 mg | ORAL_TABLET | Freq: Three times a day (TID) | ORAL | Status: DC | PRN

## 2014-03-15 MED ORDER — PREGABALIN 150 MG PO CAPS: 150.0000 mg | ORAL_CAPSULE | Freq: Two times a day (BID) | ORAL | Status: DC

## 2014-03-15 NOTE — Telephone Encounter (Signed)
Patient Notified and faxed to pharmacy

## 2014-03-15 NOTE — Telephone Encounter (Signed)
Patient Requested 

## 2014-03-20 ENCOUNTER — Ambulatory Visit: Payer: No Typology Code available for payment source | Admitting: Internal Medicine

## 2014-03-20 DIAGNOSIS — Z0289 Encounter for other administrative examinations: Secondary | ICD-10-CM

## 2014-03-23 ENCOUNTER — Encounter: Payer: Self-pay | Admitting: *Deleted

## 2014-03-23 ENCOUNTER — Telehealth: Payer: Self-pay | Admitting: *Deleted

## 2014-03-23 NOTE — Telephone Encounter (Signed)
Spoke to [place]: PA approved from 03/23/14 - 03/23/17.  Pharmacy notified [place] phone.

## 2014-03-26 ENCOUNTER — Other Ambulatory Visit: Payer: Self-pay | Admitting: *Deleted

## 2014-03-26 MED ORDER — ALPRAZOLAM 1 MG PO TABS: 1.0000 mg | ORAL_TABLET | Freq: Three times a day (TID) | ORAL | Status: DC

## 2014-03-26 NOTE — Telephone Encounter (Signed)
Patient Requested 

## 2014-04-05 ENCOUNTER — Other Ambulatory Visit: Payer: Self-pay | Admitting: *Deleted

## 2014-04-05 MED ORDER — OXYCODONE HCL 20 MG PO TABS: ORAL_TABLET | ORAL | Status: DC

## 2014-04-24 ENCOUNTER — Ambulatory Visit: Payer: Self-pay | Admitting: Internal Medicine

## 2014-04-26 ENCOUNTER — Other Ambulatory Visit: Payer: Self-pay | Admitting: *Deleted

## 2014-04-26 MED ORDER — ALPRAZOLAM 1 MG PO TABS: 1.0000 mg | ORAL_TABLET | Freq: Three times a day (TID) | ORAL | Status: DC

## 2014-04-26 NOTE — Telephone Encounter (Signed)
Patient Requested and I called into pharmacy, spoke with Pharmacist

## 2014-05-04 ENCOUNTER — Other Ambulatory Visit: Payer: Self-pay | Admitting: *Deleted

## 2014-05-04 MED ORDER — OXYCODONE HCL 20 MG PO TABS: ORAL_TABLET | ORAL | Status: DC

## 2014-05-04 NOTE — Telephone Encounter (Signed)
Patient Requested 

## 2014-05-09 ENCOUNTER — Telehealth: Payer: Self-pay | Admitting: *Deleted

## 2014-05-09 NOTE — Telephone Encounter (Signed)
Patient Notified

## 2014-05-09 NOTE — Telephone Encounter (Signed)
Lyrica Approved by insurance till 05/10/2019.

## 2014-05-29 ENCOUNTER — Encounter: Payer: Self-pay | Admitting: Internal Medicine

## 2014-05-29 ENCOUNTER — Ambulatory Visit (INDEPENDENT_AMBULATORY_CARE_PROVIDER_SITE_OTHER): Payer: 59 | Admitting: Internal Medicine

## 2014-05-29 VITALS — BP 128/70 | HR 87 | Temp 98.5°F | Resp 18 | Ht 68.0 in | Wt 167.0 lb

## 2014-05-29 DIAGNOSIS — R42 Dizziness and giddiness: Secondary | ICD-10-CM

## 2014-05-29 DIAGNOSIS — E785 Hyperlipidemia, unspecified: Secondary | ICD-10-CM

## 2014-05-29 DIAGNOSIS — IMO0002 Reserved for concepts with insufficient information to code with codable children: Secondary | ICD-10-CM

## 2014-05-29 DIAGNOSIS — G4700 Insomnia, unspecified: Secondary | ICD-10-CM

## 2014-05-29 DIAGNOSIS — E1165 Type 2 diabetes mellitus with hyperglycemia: Secondary | ICD-10-CM

## 2014-05-29 DIAGNOSIS — M5442 Lumbago with sciatica, left side: Secondary | ICD-10-CM

## 2014-05-29 DIAGNOSIS — I10 Essential (primary) hypertension: Secondary | ICD-10-CM

## 2014-05-29 DIAGNOSIS — M5441 Lumbago with sciatica, right side: Secondary | ICD-10-CM

## 2014-05-29 DIAGNOSIS — E1140 Type 2 diabetes mellitus with diabetic neuropathy, unspecified: Secondary | ICD-10-CM

## 2014-05-29 MED ORDER — ALPRAZOLAM 1 MG PO TABS: 1.0000 mg | ORAL_TABLET | Freq: Three times a day (TID) | ORAL | Status: DC

## 2014-05-29 MED ORDER — GABAPENTIN 400 MG PO CAPS: 400.0000 mg | ORAL_CAPSULE | Freq: Three times a day (TID) | ORAL | Status: DC

## 2014-05-29 MED ORDER — ATENOLOL 25 MG PO TABS: 25.0000 mg | ORAL_TABLET | Freq: Every day | ORAL | Status: DC

## 2014-05-29 MED ORDER — ATORVASTATIN CALCIUM 10 MG PO TABS: 10.0000 mg | ORAL_TABLET | Freq: Every day | ORAL | Status: DC

## 2014-05-29 MED ORDER — METFORMIN HCL 500 MG PO TABS: 500.0000 mg | ORAL_TABLET | Freq: Two times a day (BID) | ORAL | Status: DC

## 2014-05-29 MED ORDER — TRAZODONE HCL 50 MG PO TABS: 25.0000 mg | ORAL_TABLET | Freq: Every day | ORAL | Status: DC

## 2014-05-29 MED ORDER — HYDROCHLOROTHIAZIDE 25 MG PO TABS: 25.0000 mg | ORAL_TABLET | Freq: Every day | ORAL | Status: DC

## 2014-05-29 MED ORDER — OXYCODONE HCL 20 MG PO TABS: ORAL_TABLET | ORAL | Status: DC

## 2014-05-29 NOTE — Progress Notes (Signed)
Patient ID: Denise Webb, female   DOB: Apr 24, 1966, 48 y.o.   MRN: [id number]    Chief Complaint  Patient presents with  . Medical Management of Chronic Issues   Allergies  Allergen Reactions  . Erythromycin Stearate     REACTION: causes anxiety, heart to race  . Morphine And Related Nausea And Vomiting   HPI 48 y/o female pt is here for routine follow up visit.  She has been dizzy since yesterday. It worsened last night. She was told that she appeared pale. She had dizziness both at rest and with movement. Her dizziness is much improved today. Eating good, denies nausea/ vomiting or loose stool Her nerve pain has worsened She has not been able to sleep. Has tried melatonin and ambien in past without help.  She is taking b12 on daily basis She feels she will not be able to afford lyrica any more. She has started on leftover gabapentin and is taking 2 in the morning 300 mg , 1 in the afternoon and 2 in the evening- total 1500 mg daily for 3 weeks. This is helping her better with the pain She does not want influenza vaccine Continues to smoke Calm with her nerves with xanax 1 mg tid  Review of Systems   Constitutional: Negative for fever, chills, diaphoresis.   HENT: Negative for hearing loss and sore throat.    Eyes: Negative for blurred vision, double vision, her eyes have been tearing lately, denies pain or change of vision, has not seen eye doctor for few years  Respiratory: Negative for cough, sputum production, shortness of breath and wheezing.    Cardiovascular: Negative for chest pain, palpitations, orthopnea and leg swelling.   Gastrointestinal: Negative for heartburn, nausea or vomiting  Genitourinary: Negative for dysuria and flank pain.   Musculoskeletal: Negative for falls Skin: Negative for itching and rash.   Neurological: Negative for focal weakness and headaches.   Psychiatric/Behavioral: Negative for memory loss.     Past Medical History  Diagnosis Date  .  Hypertension   . Colitis   . Plantar fasciitis   . Anxiety   . Depression   . Neuropathy     severe both feet/lower legs  . Hyperlipidemia   . TMJ (dislocation of temporomandibular joint)   . IBS (irritable bowel syndrome) 2011-2014    ER visits only   . GERD (gastroesophageal reflux disease)   . Internal hemorrhoids   . Trichomoniasis    Past Surgical History  Procedure Laterality Date  . Tubal ligation    . Cesarean section  1988    [facility]  . Pelvic exenteration  1990    Uterus Frozen (cancer)  . Tubes tied  2004/2005    [facility]   Current Outpatient Prescriptions on File Prior to Visit  Medication Sig Dispense Refill  . aspirin-acetaminophen-caffeine (EXCEDRIN MIGRAINE) 250-250-65 MG per tablet Take by mouth every 6 (six) hours as needed for headache.      . glucose blood test strip 1 each by Other route as needed for other. Reli on prime: check blood sugar twice daily DX: 250.62      . Lactobacillus (ACIDOPHILUS PROBIOTIC) 100 MG CAPS Take 1 capsule (100 mg total) by mouth daily.  30 capsule  3  . loperamide (IMODIUM A-D) 2 MG tablet Take 2 mg by mouth 3 (three) times daily as needed. Take 2 tabs by mouth once daily as needed for diarrhea.      . neomycin-bacitracin-polymyxin (NEOSPORIN) [phone] ointment Apply daily to  sores on feet  28.3 g  0  . promethazine (PHENERGAN) 12.5 MG tablet Take 1 tablet (12.5 mg total) by mouth every 8 (eight) hours as needed for nausea or vomiting.  90 tablet  0  . triamcinolone (NASACORT AQ) 55 MCG/ACT AERO nasal inhaler 2 sprays in each nostril daily  1 Inhaler  1   No current facility-administered medications on file prior to visit.    Physical exam BP 128/70  Pulse 87  Temp(Src) 98.5 F (36.9 C) (Oral)  Resp 18  Ht 5\' 8"  (1.727 m)  Wt 167 lb (75.751 kg)  BMI 25.40 kg/m2  SpO2 97%  LMP 12/14/2011  Vital signs Lying down 110/80, sitting 130/78, standing 100/70  General- adult female in no acute distress Head- atraumatic,  normocephalic. No maxillary and frontal sinus tenderness Eyes- PERRLA, EOMI, no pallor, no icterus, no discharge Cardiovascular- normal s1,s2, no murmurs/ rubs/ gallops Respiratory- bilateral clear to auscultation, no wheeze, no rhonchi, no crackles Abdomen- bowel sounds present, soft, non tender Musculoskeletal- able to move all 4 extremities, no spinal and paraspinal tenderness Neurological- no focal deficit, decreased pinprick sensation upto mid leg region, normal vibration sense, tenderness to light touch Psychiatry- alert and oriented to person, place and time, normal mood   Lab Results  Component Value Date   HGBA1C 6.4* 12/19/2013   CBC Latest Ref Rng 09/19/2013 07/10/2012 02/09/2012  WBC 3.4 - 10.8 x10E3/uL 7.3 8.7 6.7  Hemoglobin 11.1 - 15.9 g/dL 14.7 13.4 13.0  Hematocrit 34.0 - 46.6 % 43.0 38.2 38.4  Platelets 150 - 400 K/uL - 260 289   CMP     Component Value Date/Time   NA 141 09/19/2013 1135   NA 135 07/10/2012 1410   K 5.1 09/19/2013 1135   CL 99 09/19/2013 1135   CO2 24 09/19/2013 1135   GLUCOSE 166* 09/19/2013 1135   GLUCOSE 148* 07/10/2012 1410   BUN 5* 09/19/2013 1135   BUN 9 07/10/2012 1410   CREATININE 0.63 09/19/2013 1135   CALCIUM 10.4* 09/19/2013 1135   PROT 7.4 09/19/2013 1135   PROT 7.4 02/09/2012 1604   ALBUMIN 3.7 02/09/2012 1604   AST 22 09/19/2013 1135   ALT 34* 09/19/2013 1135   ALKPHOS 107 09/19/2013 1135   BILITOT 0.3 09/19/2013 1135   GFRNONAA 109 09/19/2013 1135   GFRAA 125 09/19/2013 1135   Lipid Panel     Component Value Date/Time   CHOL 230* 08/08/2009 1947   TRIG 555* 09/19/2013 1135   HDL 38* 09/19/2013 1135   HDL 44 08/08/2009 1947   CHOLHDL 7.4* 09/19/2013 1135   CHOLHDL 5.2 Ratio 08/08/2009 1947   VLDL 78* 08/08/2009 1947   LDLCALC Comment 09/19/2013 1135   LDLCALC 108* 08/08/2009 1947   Lab Results  Component Value Date   TSH 0.985 09/19/2013    Assessment/plan  1. Essential hypertension, benign bp stable, continue atenolol and hctz,  check bmp today - CMP - Lipid Panel - CBC with Differential - TSH  2. DM type 2, uncontrolled, with neuropathy D/c lyrica, with her dizziness will decrease gabapentin to 1200 mg a day- 400 mg tid script provided. Continue metformin and lipitor. Check a1c. Normal urine microalbumin. Referral to neurology for her worsening neuropathic pain - TSH - Hemoglobin A1c - Ambulatory referral to Neurology  3. Bilateral low back pain with sciatica, sciatica laterality unspecified Continue oxycodone for the pain. Monitor clinically, back precautions explained  4. Hyperlipidemia Check flp, continue lipitor  5. Dizziness Likely iatrogenic with high  self adjusted dosing of gabapentin. Check h&h to rule out gi bleed with her hx of IBS, pending GI appointment. Encouraged hydration for now. Normal cardiac exam today. Also check cmp to assess for lytes abnormality  6. Insomnia Start trazodone 25 mg qhs and reassess

## 2014-05-30 DIAGNOSIS — G4700 Insomnia, unspecified: Secondary | ICD-10-CM | POA: Insufficient documentation

## 2014-05-30 DIAGNOSIS — E785 Hyperlipidemia, unspecified: Secondary | ICD-10-CM | POA: Insufficient documentation

## 2014-05-30 LAB — CBC WITH DIFFERENTIAL/PLATELET
Basophils Absolute: 0 10*3/uL (ref 0.0–0.2)
Basos: 0 %
EOS ABS: 0.1 10*3/uL (ref 0.0–0.4)
EOS: 1 %
HCT: 40.1 % (ref 34.0–46.6)
Hemoglobin: 13.9 g/dL (ref 11.1–15.9)
IMMATURE GRANS (ABS): 0 10*3/uL (ref 0.0–0.1)
IMMATURE GRANULOCYTES: 0 %
Lymphocytes Absolute: 3.8 10*3/uL — ABNORMAL HIGH (ref 0.7–3.1)
Lymphs: 40 %
MCH: 33.3 pg — AB (ref 26.6–33.0)
MCHC: 34.7 g/dL (ref 31.5–35.7)
MCV: 96 fL (ref 79–97)
Monocytes Absolute: 0.5 10*3/uL (ref 0.1–0.9)
Monocytes: 5 %
NEUTROS PCT: 54 %
Neutrophils Absolute: 5 10*3/uL (ref 1.4–7.0)
RBC: 4.18 x10E6/uL (ref 3.77–5.28)
RDW: 12.3 % (ref 12.3–15.4)
WBC: 9.4 10*3/uL (ref 3.4–10.8)

## 2014-05-30 LAB — COMPREHENSIVE METABOLIC PANEL
A/G RATIO: 1.8 (ref 1.1–2.5)
ALT: 18 IU/L (ref 0–32)
AST: 17 IU/L (ref 0–40)
Albumin: 4.8 g/dL (ref 3.5–5.5)
Alkaline Phosphatase: 80 IU/L (ref 39–117)
BILIRUBIN TOTAL: 0.2 mg/dL (ref 0.0–1.2)
BUN/Creatinine Ratio: 14 (ref 9–23)
BUN: 10 mg/dL (ref 6–24)
CO2: 26 mmol/L (ref 18–29)
CREATININE: 0.71 mg/dL (ref 0.57–1.00)
Calcium: 10.3 mg/dL — ABNORMAL HIGH (ref 8.7–10.2)
Chloride: 98 mmol/L (ref 97–108)
GFR calc non Af Amer: 102 mL/min/{1.73_m2} (ref >59)
GFR, EST AFRICAN AMERICAN: 118 mL/min/{1.73_m2} (ref >59)
GLOBULIN, TOTAL: 2.6 g/dL (ref 1.5–4.5)
Glucose: 117 mg/dL — ABNORMAL HIGH (ref 65–99)
Potassium: 3.9 mmol/L (ref 3.5–5.2)
Sodium: 144 mmol/L (ref 134–144)
TOTAL PROTEIN: 7.4 g/dL (ref 6.0–8.5)

## 2014-05-30 LAB — LIPID PANEL
Chol/HDL Ratio: 4.5 ratio units — ABNORMAL HIGH (ref 0.0–4.4)
Cholesterol, Total: 198 mg/dL (ref 100–199)
HDL: 44 mg/dL (ref >39)
LDL CALC: 79 mg/dL (ref 0–99)
Triglycerides: 377 mg/dL — ABNORMAL HIGH (ref 0–149)
VLDL CHOLESTEROL CAL: 75 mg/dL — AB (ref 5–40)

## 2014-05-30 LAB — HEMOGLOBIN A1C
Est. average glucose Bld gHb Est-mCnc: 137 mg/dL
Hgb A1c MFr Bld: 6.4 % — ABNORMAL HIGH (ref 4.8–5.6)

## 2014-05-30 LAB — TSH: TSH: 1.25 u[IU]/mL (ref 0.450–4.500)

## 2014-05-31 ENCOUNTER — Ambulatory Visit (INDEPENDENT_AMBULATORY_CARE_PROVIDER_SITE_OTHER): Payer: 59 | Admitting: Neurology

## 2014-05-31 ENCOUNTER — Encounter: Payer: Self-pay | Admitting: Neurology

## 2014-05-31 VITALS — BP 105/74 | HR 87 | Ht 68.0 in | Wt 164.0 lb

## 2014-05-31 DIAGNOSIS — G4700 Insomnia, unspecified: Secondary | ICD-10-CM

## 2014-05-31 DIAGNOSIS — M5441 Lumbago with sciatica, right side: Secondary | ICD-10-CM

## 2014-05-31 DIAGNOSIS — G5791 Unspecified mononeuropathy of right lower limb: Secondary | ICD-10-CM

## 2014-05-31 DIAGNOSIS — G5793 Unspecified mononeuropathy of bilateral lower limbs: Secondary | ICD-10-CM

## 2014-05-31 DIAGNOSIS — F1010 Alcohol abuse, uncomplicated: Secondary | ICD-10-CM

## 2014-05-31 DIAGNOSIS — G43009 Migraine without aura, not intractable, without status migrainosus: Secondary | ICD-10-CM

## 2014-05-31 DIAGNOSIS — G5792 Unspecified mononeuropathy of left lower limb: Secondary | ICD-10-CM

## 2014-05-31 NOTE — Progress Notes (Signed)
PATIENT: Denise Webb DOB: 07-29-1966  HISTORICAL  Denise Webb is 48 RH CAUCASIAN FEMALE, WAS REFERRED BY HER PRIMARY CARE PHYSICIAN Dr. Bubba Camp for evaluation of neuropathy  She has past medical history of hypertension, diabetes, hyperlipidemia, depression, anxiety  She carries a diagnosis of neuropathy, symptoms started around 2013, complains of left plantar feet discomfort, gradually getting worse over the past couple years, reported abnormal nerve conduction study at outlying facility, which confirmed the diagnosis of peripheral neuropathy,  Since January this year, she also complains of bilateral fingertips numbness tingling, subjective weakness, difficulty opening a soda can, she has urinary urgency, some midline low back pain, but no bowel bladder incontinence, she also has some  shooting pain bilateral lower extremity muscles.   REVIEW OF SYSTEMS: Full 14 system review of systems performed and notable only for as above ALLERGIES: Allergies  Allergen Reactions  . Erythromycin Stearate     REACTION: causes anxiety, heart to race  . Morphine And Related Nausea And Vomiting    HOME MEDICATIONS: Current Outpatient Prescriptions on File Prior to Visit  Medication Sig Dispense Refill  . ALPRAZolam (XANAX) 1 MG tablet Take 1 tablet (1 mg total) by mouth 3 (three) times daily.  90 tablet  0  . aspirin-acetaminophen-caffeine (EXCEDRIN MIGRAINE) 161-096-04 MG per tablet Take by mouth every 6 (six) hours as needed for headache.      Marland Kitchen atenolol (TENORMIN) 25 MG tablet Take 1 tablet (25 mg total) by mouth daily.  90 tablet  3  . atorvastatin (LIPITOR) 10 MG tablet Take 1 tablet (10 mg total) by mouth daily.  90 tablet  3  . gabapentin (NEURONTIN) 400 MG capsule Take 1 capsule (400 mg total) by mouth 3 (three) times daily.  90 capsule  3  . glucose blood test strip 1 each by Other route as needed for other. Reli on prime: check blood sugar twice daily DX: 250.62      .  hydrochlorothiazide (HYDRODIURIL) 25 MG tablet Take 1 tablet (25 mg total) by mouth daily.  90 tablet  3  . Lactobacillus (ACIDOPHILUS PROBIOTIC) 100 MG CAPS Take 1 capsule (100 mg total) by mouth daily.  30 capsule  3  . loperamide (IMODIUM A-D) 2 MG tablet Take 2 mg by mouth 3 (three) times daily as needed. Take 2 tabs by mouth once daily as needed for diarrhea.      . metFORMIN (GLUCOPHAGE) 500 MG tablet Take 1 tablet (500 mg total) by mouth 2 (two) times daily with a meal.  180 tablet  3  . neomycin-bacitracin-polymyxin (NEOSPORIN) [phone] ointment Apply daily to sores on feet  28.3 g  0  . Oxycodone HCl 20 MG TABS Take one tablet by mouth three times daily for pain  90 tablet  0  . promethazine (PHENERGAN) 12.5 MG tablet Take 1 tablet (12.5 mg total) by mouth every 8 (eight) hours as needed for nausea or vomiting.  90 tablet  0  . traZODone (DESYREL) 50 MG tablet Take 0.5 tablets (25 mg total) by mouth at bedtime.  30 tablet  3  . triamcinolone (NASACORT AQ) 55 MCG/ACT AERO nasal inhaler 2 sprays in each nostril daily  1 Inhaler  1   No current facility-administered medications on file prior to visit.    PAST MEDICAL HISTORY: Past Medical History  Diagnosis Date  . Hypertension   . Colitis   . Plantar fasciitis   . Anxiety   . Depression   . Neuropathy  severe both feet/lower legs  . Hyperlipidemia   . TMJ (dislocation of temporomandibular joint)   . IBS (irritable bowel syndrome) 2011-2014    ER visits only   . GERD (gastroesophageal reflux disease)   . Internal hemorrhoids   . Trichomoniasis     PAST SURGICAL HISTORY: Past Surgical History  Procedure Laterality Date  . Tubal ligation    . Cesarean section  1988    [facility]  . Pelvic exenteration  1990    Uterus Frozen (cancer)  . Tubes tied  2004/2005    [facility]    FAMILY HISTORY: Family History  Problem Relation Age of Onset  . Heart disease Mother   . Heart attack Mother   . Heart attack Father   .  Cancer Brother     colon cancer  . Cancer Brother 58    lung cancer  . Heart disease Brother   . Anxiety disorder Brother   . Heart attack Other     SOCIAL HISTORY:  History   Social History  . Marital Status: Married    Spouse Name: Micheal     Number of Children: 1  . Years of Education: college   Occupational History  . On disability    Social History Main Topics  . Smoking status: Current Every Day Smoker -- 1.00 packs/day    Types: Cigarettes  . Smokeless tobacco: Never Used  . Alcohol Use: 0.0 oz/week    2-3 Cans of beer per week     Comment: on weekend occassionally  . Drug Use: No  . Sexual Activity: Not on file   Other Topics Concern  . Not on file   Social History Narrative   Patient lives at home with her husband Hoover Browns)   Trying to get disability.   Education college    Right handed.    PHYSICAL EXAM   Filed Vitals:   05/31/14 1544  BP: 105/74  Pulse: 87  Height: 5\' 8"  (1.727 m)  Weight: 164 lb (74.39 kg)    Not recorded    Body mass index is 24.94 kg/(m^2).   Generalized: In no acute distress  Neck: Supple, no carotid bruits   Cardiac: Regular rate rhythm  Pulmonary: Clear to auscultation bilaterally  Musculoskeletal: No deformity  Neurological examination  Mentation: Alert oriented to time, place, history taking, and causual conversation  Cranial nerve II-XII: Pupils were equal round reactive to light. Extraocular movements were full.  Visual field were full on confrontational test. Bilateral fundi were sharp.  Facial sensation and strength were normal. Hearing was intact to finger rubbing bilaterally. Uvula tongue midline.  Head turning and shoulder shrug and were normal and symmetric.Tongue protrusion into cheek strength was normal.  Motor: Normal tone, bulk and strength.  Sensory: Length dependent decreased fine touch, pinprick distal leg level, preserved vibratory sensation, and proprioception at toes.  Coordination:  Normal finger to nose, heel-to-shin bilaterally there was no truncal ataxia  Gait: Rising up from seated position without assistance, normal stance, without trunk ataxia, moderate stride, good arm swing, smooth turning, able to perform tiptoe, and heel walking without difficulty.   Romberg signs: Negative  Deep tendon reflexes: Brachioradialis 2/2, biceps 2/2, triceps 2/2, patellar 2/2, Achilles 2/2, plantar responses were flexor bilaterally.   DIAGNOSTIC DATA (LABS, IMAGING, TESTING) - I reviewed patient records, labs, notes, testing and imaging myself where available.  Lab Results  Component Value Date   WBC 9.4 05/29/2014   HGB 13.9 05/29/2014   HCT 40.1 05/29/2014  MCV 96 05/29/2014   PLT 260 07/10/2012      Component Value Date/Time   NA 144 05/29/2014 1444   NA 135 07/10/2012 1410   K 3.9 05/29/2014 1444   CL 98 05/29/2014 1444   CO2 26 05/29/2014 1444   GLUCOSE 117* 05/29/2014 1444   GLUCOSE 148* 07/10/2012 1410   BUN 10 05/29/2014 1444   BUN 9 07/10/2012 1410   CREATININE 0.71 05/29/2014 1444   CALCIUM 10.3* 05/29/2014 1444   PROT 7.4 05/29/2014 1444   PROT 7.4 02/09/2012 1604   ALBUMIN 3.7 02/09/2012 1604   AST 17 05/29/2014 1444   ALT 18 05/29/2014 1444   ALKPHOS 80 05/29/2014 1444   BILITOT 0.2 05/29/2014 1444   GFRNONAA 102 05/29/2014 1444   GFRAA 118 05/29/2014 1444   Lab Results  Component Value Date   CHOL 230* 08/08/2009   HDL 44 05/29/2014   LDLCALC 79 05/29/2014   TRIG 377* 05/29/2014   CHOLHDL 4.5* 05/29/2014   Lab Results  Component Value Date   HGBA1C 6.4* 05/29/2014   No results found for this basename: VITAMINB12   Lab Results  Component Value Date   TSH 1.250 05/29/2014      ASSESSMENT AND PLAN  Denise Webb is a 48 y.o. female complains of bilateral feet paresthesia, midline low back pain, history of diabetes,  Differentiation diagnosis including peripheral neuropathy, need to rule out lumbar radiculopathy, MRI lumbar spine EMG nerve  conduction study    Marcial Pacas, M.D. Ph.D.  [facility] [address], [place], [place] [address] [phone]

## 2014-06-14 ENCOUNTER — Ambulatory Visit (INDEPENDENT_AMBULATORY_CARE_PROVIDER_SITE_OTHER): Payer: 59

## 2014-06-14 DIAGNOSIS — F1010 Alcohol abuse, uncomplicated: Secondary | ICD-10-CM

## 2014-06-14 DIAGNOSIS — G5793 Unspecified mononeuropathy of bilateral lower limbs: Secondary | ICD-10-CM

## 2014-06-14 DIAGNOSIS — G4700 Insomnia, unspecified: Secondary | ICD-10-CM

## 2014-06-14 DIAGNOSIS — M5441 Lumbago with sciatica, right side: Secondary | ICD-10-CM

## 2014-06-14 DIAGNOSIS — G5792 Unspecified mononeuropathy of left lower limb: Secondary | ICD-10-CM

## 2014-06-14 DIAGNOSIS — G43009 Migraine without aura, not intractable, without status migrainosus: Secondary | ICD-10-CM

## 2014-06-14 DIAGNOSIS — G5791 Unspecified mononeuropathy of right lower limb: Secondary | ICD-10-CM

## 2014-06-18 ENCOUNTER — Ambulatory Visit (INDEPENDENT_AMBULATORY_CARE_PROVIDER_SITE_OTHER): Payer: 59 | Admitting: Neurology

## 2014-06-18 ENCOUNTER — Encounter (INDEPENDENT_AMBULATORY_CARE_PROVIDER_SITE_OTHER): Payer: 59

## 2014-06-18 DIAGNOSIS — G5792 Unspecified mononeuropathy of left lower limb: Secondary | ICD-10-CM

## 2014-06-18 DIAGNOSIS — R269 Unspecified abnormalities of gait and mobility: Secondary | ICD-10-CM | POA: Insufficient documentation

## 2014-06-18 DIAGNOSIS — Z0289 Encounter for other administrative examinations: Secondary | ICD-10-CM

## 2014-06-18 DIAGNOSIS — M792 Neuralgia and neuritis, unspecified: Secondary | ICD-10-CM

## 2014-06-18 DIAGNOSIS — E1165 Type 2 diabetes mellitus with hyperglycemia: Secondary | ICD-10-CM

## 2014-06-18 DIAGNOSIS — E1140 Type 2 diabetes mellitus with diabetic neuropathy, unspecified: Secondary | ICD-10-CM

## 2014-06-18 DIAGNOSIS — G4700 Insomnia, unspecified: Secondary | ICD-10-CM

## 2014-06-18 DIAGNOSIS — F341 Dysthymic disorder: Secondary | ICD-10-CM

## 2014-06-18 DIAGNOSIS — G5793 Unspecified mononeuropathy of bilateral lower limbs: Secondary | ICD-10-CM

## 2014-06-18 DIAGNOSIS — M5441 Lumbago with sciatica, right side: Secondary | ICD-10-CM

## 2014-06-18 DIAGNOSIS — G43009 Migraine without aura, not intractable, without status migrainosus: Secondary | ICD-10-CM

## 2014-06-18 DIAGNOSIS — M79604 Pain in right leg: Secondary | ICD-10-CM

## 2014-06-18 DIAGNOSIS — IMO0002 Reserved for concepts with insufficient information to code with codable children: Secondary | ICD-10-CM

## 2014-06-18 DIAGNOSIS — M79605 Pain in left leg: Secondary | ICD-10-CM

## 2014-06-18 DIAGNOSIS — F1010 Alcohol abuse, uncomplicated: Secondary | ICD-10-CM

## 2014-06-18 DIAGNOSIS — R202 Paresthesia of skin: Secondary | ICD-10-CM

## 2014-06-18 DIAGNOSIS — M542 Cervicalgia: Secondary | ICD-10-CM

## 2014-06-18 DIAGNOSIS — G5791 Unspecified mononeuropathy of right lower limb: Secondary | ICD-10-CM

## 2014-06-18 NOTE — Progress Notes (Signed)
PATIENT: Denise Webb DOB: 1965/11/26  HISTORICAL  Denise Webb is 48 RH CAUCASIAN FEMALE, WAS REFERRED BY HER PRIMARY CARE PHYSICIAN Dr. Bubba Camp for evaluation of neuropathy  She has past medical history of hypertension, diabetes, hyperlipidemia, depression, anxiety  She carries a diagnosis of neuropathy, symptoms started around 2013, complains of left plantar feet discomfort, gradually getting worse over the past couple years, reported abnormal nerve conduction study at outlying facility, which confirmed the diagnosis of peripheral neuropathy,  Since January this year, she also complains of bilateral fingertips numbness tingling, subjective weakness, difficulty opening a soda can, she has urinary urgency, some midline low back pain, but no bowel bladder incontinence, she also has some  shooting pain bilateral lower extremity muscles.  UPDATE Oct 26th 2015: She returned with her husband for electrodiagnostic study today, which was essentially normal, there was no evidence of large fiber peripheral neuropathy, no evidence of bilateral lumbosacral radiculopathy, all right cervical radiculopathy.  MRI of lumbar showing central herniated disc at L 5-S1 with mild left more than right foraminal narrowing.  She continue to complains of low back pain, chronic neck pain, bilateral feet shooting pain, burning sensation, discolorations, difficulty with her gait,  she is receiving chronic oxycodone from her primary care physician Dr. Bubba Camp.  She is frustrated, and tearful during today's examinations   REVIEW OF SYSTEMS: Full 14 system review of systems performed and notable only for as above ALLERGIES: Allergies  Allergen Reactions  . Erythromycin Stearate     REACTION: causes anxiety, heart to race  . Morphine And Related Nausea And Vomiting    HOME MEDICATIONS: Current Outpatient Prescriptions on File Prior to Visit  Medication Sig Dispense Refill  . ALPRAZolam (XANAX) 1 MG  tablet Take 1 tablet (1 mg total) by mouth 3 (three) times daily.  90 tablet  0  . aspirin-acetaminophen-caffeine (EXCEDRIN MIGRAINE) 381-829-93 MG per tablet Take by mouth every 6 (six) hours as needed for headache.      Marland Kitchen atenolol (TENORMIN) 25 MG tablet Take 1 tablet (25 mg total) by mouth daily.  90 tablet  3  . atorvastatin (LIPITOR) 10 MG tablet Take 1 tablet (10 mg total) by mouth daily.  90 tablet  3  . Calcium Acetate, Phos Binder, (CALCIUM ACETATE PO) Take by mouth. By mouth every other day.      . gabapentin (NEURONTIN) 400 MG capsule Take 1 capsule (400 mg total) by mouth 3 (three) times daily.  90 capsule  3  . glucose blood test strip 1 each by Other route as needed for other. Reli on prime: check blood sugar twice daily DX: 250.62      . hydrochlorothiazide (HYDRODIURIL) 25 MG tablet Take 1 tablet (25 mg total) by mouth daily.  90 tablet  3  . Lactobacillus (ACIDOPHILUS PROBIOTIC) 100 MG CAPS Take 1 capsule (100 mg total) by mouth daily.  30 capsule  3  . loperamide (IMODIUM A-D) 2 MG tablet Take 2 mg by mouth 3 (three) times daily as needed. Take 2 tabs by mouth once daily as needed for diarrhea.      . metFORMIN (GLUCOPHAGE) 500 MG tablet Take 1 tablet (500 mg total) by mouth 2 (two) times daily with a meal.  180 tablet  3  . neomycin-bacitracin-polymyxin (NEOSPORIN) [phone] ointment Apply daily to sores on feet  28.3 g  0  . Oxycodone HCl 20 MG TABS Take one tablet by mouth three times daily for pain  90 tablet  0  .  Probiotic Product (PROBIOTIC DAILY PO) Take by mouth daily.      . promethazine (PHENERGAN) 12.5 MG tablet Take 1 tablet (12.5 mg total) by mouth every 8 (eight) hours as needed for nausea or vomiting.  90 tablet  0  . traZODone (DESYREL) 50 MG tablet Take 0.5 tablets (25 mg total) by mouth at bedtime.  30 tablet  3  . triamcinolone (NASACORT AQ) 55 MCG/ACT AERO nasal inhaler 2 sprays in each nostril daily  1 Inhaler  1   No current facility-administered  medications on file prior to visit.    PAST MEDICAL HISTORY: Past Medical History  Diagnosis Date  . Hypertension   . Colitis   . Plantar fasciitis   . Anxiety   . Depression   . Neuropathy     severe both feet/lower legs  . Hyperlipidemia   . TMJ (dislocation of temporomandibular joint)   . IBS (irritable bowel syndrome) 2011-2014    ER visits only   . GERD (gastroesophageal reflux disease)   . Internal hemorrhoids   . Trichomoniasis     PAST SURGICAL HISTORY: Past Surgical History  Procedure Laterality Date  . Tubal ligation    . Cesarean section  1988    [facility]  . Pelvic exenteration  1990    Uterus Frozen (cancer)  . Tubes tied  2004/2005    [facility]    FAMILY HISTORY: Family History  Problem Relation Age of Onset  . Heart disease Mother   . Heart attack Mother   . Heart attack Father   . Cancer Brother     colon cancer  . Cancer Brother 21    lung cancer  . Heart disease Brother   . Anxiety disorder Brother   . Heart attack Other     SOCIAL HISTORY:  History   Social History  . Marital Status: Married    Spouse Name: Micheal     Number of Children: 1  . Years of Education: college   Occupational History  . On disability    Social History Main Topics  . Smoking status: Current Every Day Smoker -- 1.00 packs/day    Types: Cigarettes  . Smokeless tobacco: Never Used  . Alcohol Use: 0.0 oz/week    2-3 Cans of beer per week     Comment: on weekend occassionally  . Drug Use: No  . Sexual Activity: Not on file   Other Topics Concern  . Not on file   Social History Narrative   Patient lives at home with her husband Denise Webb)   Trying to get disability.   Education college    Right handed.    PHYSICAL EXAM   There were no vitals filed for this visit.  Not recorded    There is no weight on file to calculate BMI.   Generalized: In no acute distress  Neck: Supple, no carotid bruits   Cardiac: Regular rate rhythm  Pulmonary:  Clear to auscultation bilaterally  Musculoskeletal: No deformity  Neurological examination  Mentation: Alert oriented to time, place, history taking, and causual conversation  Cranial nerve II-XII: Pupils were equal round reactive to light. Extraocular movements were full.  Visual field were full on confrontational test. Bilateral fundi were sharp.  Facial sensation and strength were normal. Hearing was intact to finger rubbing bilaterally. Uvula tongue midline.  Head turning and shoulder shrug and were normal and symmetric.Tongue protrusion into cheek strength was normal.  Motor: Normal tone, bulk and strength.  Sensory: Length dependent decreased  fine touch, pinprick distal leg level, preserved vibratory sensation, and proprioception at toes.  Coordination: Normal finger to nose, heel-to-shin bilaterally there was no truncal ataxia  Gait: antalgic cautious  Romberg signs: Negative  Deep tendon reflexes: Brachioradialis 2/2, biceps 2/2, triceps 2/2, patellar 2/2, Achilles 2/2, plantar responses were flexor bilaterally.   DIAGNOSTIC DATA (LABS, IMAGING, TESTING) - I reviewed patient records, labs, notes, testing and imaging myself where available.  Lab Results  Component Value Date   WBC 9.4 05/29/2014   HGB 13.9 05/29/2014   HCT 40.1 05/29/2014   MCV 96 05/29/2014   PLT 260 07/10/2012      Component Value Date/Time   NA 144 05/29/2014 1444   NA 135 07/10/2012 1410   K 3.9 05/29/2014 1444   CL 98 05/29/2014 1444   CO2 26 05/29/2014 1444   GLUCOSE 117* 05/29/2014 1444   GLUCOSE 148* 07/10/2012 1410   BUN 10 05/29/2014 1444   BUN 9 07/10/2012 1410   CREATININE 0.71 05/29/2014 1444   CALCIUM 10.3* 05/29/2014 1444   PROT 7.4 05/29/2014 1444   PROT 7.4 02/09/2012 1604   ALBUMIN 3.7 02/09/2012 1604   AST 17 05/29/2014 1444   ALT 18 05/29/2014 1444   ALKPHOS 80 05/29/2014 1444   BILITOT 0.2 05/29/2014 1444   GFRNONAA 102 05/29/2014 1444   GFRAA 118 05/29/2014 1444   Lab Results    Component Value Date   CHOL 230* 08/08/2009   HDL 44 05/29/2014   LDLCALC 79 05/29/2014   TRIG 377* 05/29/2014   CHOLHDL 4.5* 05/29/2014   Lab Results  Component Value Date   HGBA1C 6.4* 05/29/2014   No results found for this basename: VITAMINB12   Lab Results  Component Value Date   TSH 1.250 05/29/2014      ASSESSMENT AND PLAN  Denise Webb is a 48 y.o. 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 Proceed with MRI of cervical spine  3. Skin biopsy  4. She is receiving chronic necrotic spell primary care physician Dr. Bubba Camp,     Marcial Pacas, M.D. Ph.D.  [facility] [address], [place], [place] [address] [phone]

## 2014-06-18 NOTE — Procedures (Signed)
   NCS (NERVE CONDUCTION STUDY) WITH EMG (ELECTROMYOGRAPHY) REPORT   STUDY DATE: June 18 2014 PATIENT NAME: Denise Webb DOB: [birth year] MRN: [id number]    TECHNOLOGIST: Laretta Alstrom ELECTROMYOGRAPHER: Marcial Pacas M.D.  CLINICAL INFORMATION: 48 year old female, with history of diabetes, presenting with bilateral feet burning pain since her diabetes was out of control in 2014, she also complains of chronic low back pain, neck pain, now worsening bilateral feet paresthesia, and fingertips paresthesia, gait difficulty  FINDINGS: NERVE CONDUCTION STUDY: Bilateral peroneal sensory responses were normal. Bilateral peroneal, tibial motor responses were normal. Bilateral tibial H reflexes were normal and symmetric. Right median, ulnar sensory and motor responses were normal.  NEEDLE ELECTROMYOGRAPHY: Selected needle examination was performed at bilateral lower extremity muscles, bilateral lumbar sacral paraspinal muscles, right upper extremity muscles, and the right cervical paraspinal muscles.  Needle examination of bilateral tibialis anterior, tibialis posterior, peroneal longus, medial gastrocnemius, vastus lateralis was normal.  There was no spontaneous activity at bilateral lumbosacral paraspinal muscles, bilateral L4, L5, S1.  Needle examination of right pronator teres, brachioradialis, biceps, first dorsal interossei was normal  There was no spontaneous activity at right cervical paraspinal muscles, right C5, C6-C7.  IMPRESSION:   This is a normal study. There was no electrodiagnostic evidence of large fiber peripheral neuropathy, bilateral lumbosacral radiculopathy, or right cervical radiculopathy   INTERPRETING PHYSICIAN:   Marcial Pacas M.D. Ph.D. [facility] [address] [place], [place] [address] [phone]

## 2014-06-21 ENCOUNTER — Ambulatory Visit (INDEPENDENT_AMBULATORY_CARE_PROVIDER_SITE_OTHER): Payer: 59

## 2014-06-21 DIAGNOSIS — M79605 Pain in left leg: Secondary | ICD-10-CM

## 2014-06-21 DIAGNOSIS — G43009 Migraine without aura, not intractable, without status migrainosus: Secondary | ICD-10-CM

## 2014-06-21 DIAGNOSIS — R202 Paresthesia of skin: Secondary | ICD-10-CM

## 2014-06-21 DIAGNOSIS — M792 Neuralgia and neuritis, unspecified: Secondary | ICD-10-CM

## 2014-06-21 DIAGNOSIS — M542 Cervicalgia: Secondary | ICD-10-CM

## 2014-06-21 DIAGNOSIS — G4700 Insomnia, unspecified: Secondary | ICD-10-CM

## 2014-06-21 DIAGNOSIS — IMO0002 Reserved for concepts with insufficient information to code with codable children: Secondary | ICD-10-CM

## 2014-06-21 DIAGNOSIS — G5793 Unspecified mononeuropathy of bilateral lower limbs: Secondary | ICD-10-CM

## 2014-06-21 DIAGNOSIS — G5792 Unspecified mononeuropathy of left lower limb: Secondary | ICD-10-CM

## 2014-06-21 DIAGNOSIS — M79604 Pain in right leg: Secondary | ICD-10-CM

## 2014-06-21 DIAGNOSIS — F1010 Alcohol abuse, uncomplicated: Secondary | ICD-10-CM

## 2014-06-21 DIAGNOSIS — F341 Dysthymic disorder: Secondary | ICD-10-CM

## 2014-06-21 DIAGNOSIS — E1140 Type 2 diabetes mellitus with diabetic neuropathy, unspecified: Secondary | ICD-10-CM

## 2014-06-21 DIAGNOSIS — E1165 Type 2 diabetes mellitus with hyperglycemia: Secondary | ICD-10-CM

## 2014-06-21 DIAGNOSIS — M5441 Lumbago with sciatica, right side: Secondary | ICD-10-CM

## 2014-06-21 DIAGNOSIS — R269 Unspecified abnormalities of gait and mobility: Secondary | ICD-10-CM

## 2014-06-21 DIAGNOSIS — G5791 Unspecified mononeuropathy of right lower limb: Secondary | ICD-10-CM

## 2014-06-22 ENCOUNTER — Ambulatory Visit: Payer: Self-pay | Admitting: Neurology

## 2014-06-26 ENCOUNTER — Other Ambulatory Visit: Payer: Self-pay | Admitting: *Deleted

## 2014-06-26 ENCOUNTER — Encounter: Payer: Self-pay | Admitting: Internal Medicine

## 2014-06-26 ENCOUNTER — Ambulatory Visit (INDEPENDENT_AMBULATORY_CARE_PROVIDER_SITE_OTHER): Payer: 59 | Admitting: Internal Medicine

## 2014-06-26 VITALS — BP 106/82 | HR 72 | Ht 68.0 in | Wt 165.2 lb

## 2014-06-26 DIAGNOSIS — R1900 Intra-abdominal and pelvic swelling, mass and lump, unspecified site: Secondary | ICD-10-CM

## 2014-06-26 DIAGNOSIS — R1084 Generalized abdominal pain: Secondary | ICD-10-CM

## 2014-06-26 DIAGNOSIS — R197 Diarrhea, unspecified: Secondary | ICD-10-CM

## 2014-06-26 DIAGNOSIS — K219 Gastro-esophageal reflux disease without esophagitis: Secondary | ICD-10-CM

## 2014-06-26 MED ORDER — OMEPRAZOLE 20 MG PO CPDR: 20.0000 mg | DELAYED_RELEASE_CAPSULE | Freq: Every day | ORAL | Status: DC

## 2014-06-26 NOTE — Progress Notes (Signed)
Quick Note:  Called and spoke to patient relayed normal cervical spine patient understood. ______

## 2014-06-26 NOTE — Patient Instructions (Signed)
We have sent the following medications to your pharmacy for you to pick up at your convenience:  Omeprazole    You have been scheduled for a CT scan of the abdomen and pelvis at [facility] ([address]---this is in the same building as Press [facility]).   You are scheduled on 06/27/2014 at 1:30pm. You should arrive 15 minutes prior to your appointment time for registration. Please follow the written instructions below on the day of your exam:  WARNING: IF YOU ARE ALLERGIC TO IODINE/X-RAY DYE, PLEASE NOTIFY RADIOLOGY IMMEDIATELY AT [phone]! YOU WILL BE GIVEN A 13 HOUR PREMEDICATION PREP.  1) Do not eat or drink anything after 8:30am (4 hours prior to your test) 2) You have been given 2 bottles of oral contrast to drink. The solution may taste better if refrigerated, but do NOT add ice or any other liquid to this solution. Shake well before drinking.    Drink 1 bottle of contrast @ 11:30am (2 hours prior to your exam)  Drink 1 bottle of contrast @ 12:30pm (1 hour prior to your exam)  You may take any medications as prescribed with a small amount of water except for the following: Metformin, Glucophage, Glucovance, Avandamet, Riomet, Fortamet, Actoplus Met, Janumet, Glumetza or Metaglip. The above medications must be held the day of the exam AND 48 hours after the exam.  The purpose of you drinking the oral contrast is to aid in the visualization of your intestinal tract. The contrast solution may cause some diarrhea. Before your exam is started, you will be given a small amount of fluid to drink. Depending on your individual set of symptoms, you may also receive an intravenous injection of x-ray contrast/dye. Plan on being at [facility] for 30 minutes or long, depending on the type of exam you are having performed.  If you have any questions regarding your exam or if you need to reschedule, you may call the CT department at [phone] between the hours of 8:00 am  and 5:00 pm, Monday-Friday.  ________________________________________________________________________

## 2014-06-26 NOTE — Progress Notes (Signed)
HISTORY OF PRESENT ILLNESS:  Denise Webb is a 48 y.o. female with hyperlipidemia, anxiety disorder, depression, chronic back pain, degenerative arthritis, and chronic abdominal complaints. She was evaluated February 2011 for chronic lower abdominal pain. See that dictation. CT scan of the abdomen and pelvis 11/13/2010 was unremarkable. She subsequently underwent upper endoscopy and colonoscopy March 2011. Both examinations were entirely normal. No GI cause for her symptoms were found or suspected. She presents today complaining of ongoing pain that she has had for years. She reports that the right mid side of her abdomen protrudes with what feels like a mass. Also daily problems with diarrhea and postprandial bloating. She states she takes Imodium (up to 8) 4 times per week as well as Pepto-Bismol. She continues to take oxycodone 3 times daily for "neuropathy" and her abdominal complaints. She is sent today by her PCP regarding chronic abdominal complaints. Question the need for repeat endoscopic evaluations. Patient has had nausea and rare vomiting. No hematemesis, melena, or hematochezia. No fevers. Blood work from January 2015 was unremarkable including hemoglobin 14.7. Comprehensive metabolic panel was unremarkable. She also reports chronic problems with significant GERD as manifested by pyrosis. Taking Zantac 150 mg on demand  REVIEW OF SYSTEMS:  All non-GI ROS negative except for back pain, depression, fatigue, allergies, night sweats(menopause), sleeping problems, ankle swelling  Past Medical History  Diagnosis Date  . Hypertension   . Colitis   . Plantar fasciitis   . Anxiety   . Depression   . Neuropathy     severe both feet/lower legs  . Hyperlipidemia   . TMJ (dislocation of temporomandibular joint)   . IBS (irritable bowel syndrome) 2011-2014    ER visits only   . GERD (gastroesophageal reflux disease)   . Internal hemorrhoids   . Trichomoniasis     Past Surgical History   Procedure Laterality Date  . Tubal ligation    . Cesarean section  1988    [facility]  . Pelvic exenteration  1990    Uterus Frozen (cancer)  . Tubes tied  2004/2005    [facility]    Social History Denise Webb  reports that she has been smoking Cigarettes.  She has been smoking about 1.00 pack per day. She has never used smokeless tobacco. She reports that she drinks alcohol. She reports that she does not use illicit drugs.  family history includes Anxiety disorder in her brother; Cancer in her brother; Cancer (age of onset: 20) in her brother; Colitis in her brother; Heart attack in her father, mother, and other; Heart disease in her brother and mother.  Allergies  Allergen Reactions  . Erythromycin Stearate     REACTION: causes anxiety, heart to race  . Morphine And Related Nausea And Vomiting       PHYSICAL EXAMINATION: Vital signs: BP 106/82 mmHg  Pulse 72  Ht 5\' 8"  (1.727 m)  Wt 165 lb 3.2 oz (74.934 kg)  BMI 25.12 kg/m2  LMP 12/14/2011  Constitutional: generally well-appearing, no acute distress but complaining of pain throughout the visit Psychiatric: alert and oriented x3, cooperative Eyes: extraocular movements intact, anicteric, conjunctiva pink Mouth: oral pharynx moist, no lesions Neck: supple no lymphadenopathy Cardiovascular: heart regular rate and rhythm, no murmur Lungs: clear to auscultation bilaterally Abdomen: soft, complaints of abdominal tenderness with minimal superficial palpation, nondistended, no obvious ascites, no peritoneal signs, normal bowel sounds, no organomegaly Rectal:ommitted Extremities: no lower extremity edema bilaterally Skin: no lesions on visible extremities. Tattoos Neuro: No focal deficits. Normal reflexes.  No asterixis. NOTE: Tenderness with palpation in many areas including extremities, back, flanks, as well as abdomen   ASSESSMENT:  #1. Chronic abdominal pain. Unlikely a primary GI process. Remote history of isolated  ischemic colitis. #2. Chronic diarrhea #3. GERD. Significant symptoms #4. Normal colonoscopy and upper endoscopy 2011.   PLAN:  #1. Strict adherence to reflux precautions including discontinuation of smoking #2. Prescribed omeprazole 20 mg daily for active GERD symptoms #3. Contrast-enhanced CT scan of the abdomen and pelvis to further evaluate worsening abdominal pain and masslike sensation. #4. If CT negative, resume general medical care with PCP... With complaints of pain to palpation throughout multiple areas of her body it is conceivable that she may have fibromyalgia or possibly an element of somatization and/or pain medication seeking behavior.

## 2014-06-27 ENCOUNTER — Ambulatory Visit (INDEPENDENT_AMBULATORY_CARE_PROVIDER_SITE_OTHER)
Admission: RE | Admit: 2014-06-27 | Discharge: 2014-06-27 | Disposition: A | Discharge status: Home / Self Care | Payer: 59 | Source: Ambulatory Visit | Attending: Internal Medicine | Admitting: Internal Medicine

## 2014-06-27 DIAGNOSIS — R1084 Generalized abdominal pain: Secondary | ICD-10-CM

## 2014-06-27 DIAGNOSIS — K219 Gastro-esophageal reflux disease without esophagitis: Secondary | ICD-10-CM

## 2014-06-27 MED ORDER — IOHEXOL 300 MG/ML  SOLN
100.0000 mL | Freq: Once | INTRAMUSCULAR | Status: AC | PRN
  Administered 2014-06-27: 100 mL via INTRAVENOUS

## 2014-06-28 ENCOUNTER — Other Ambulatory Visit: Payer: Self-pay | Admitting: *Deleted

## 2014-06-28 MED ORDER — OXYCODONE HCL 20 MG PO TABS: ORAL_TABLET | ORAL | Status: DC

## 2014-06-28 MED ORDER — ALPRAZOLAM 1 MG PO TABS: 1.0000 mg | ORAL_TABLET | Freq: Three times a day (TID) | ORAL | Status: DC

## 2014-06-28 NOTE — Telephone Encounter (Signed)
Patient Requested and will pick up 

## 2014-07-04 ENCOUNTER — Encounter: Payer: Self-pay | Admitting: Neurology

## 2014-07-25 ENCOUNTER — Other Ambulatory Visit: Payer: Self-pay | Admitting: *Deleted

## 2014-07-25 MED ORDER — ALPRAZOLAM 1 MG PO TABS: 1.0000 mg | ORAL_TABLET | Freq: Three times a day (TID) | ORAL | Status: DC

## 2014-07-25 MED ORDER — OXYCODONE HCL 20 MG PO TABS: ORAL_TABLET | ORAL | Status: DC

## 2014-07-25 NOTE — Telephone Encounter (Signed)
Bother is in Hospice care and had to pick up alittle early due to a family conference

## 2014-08-02 ENCOUNTER — Other Ambulatory Visit: Payer: Self-pay | Admitting: Internal Medicine

## 2014-08-02 ENCOUNTER — Other Ambulatory Visit: Payer: Self-pay | Admitting: Nurse Practitioner

## 2014-08-14 ENCOUNTER — Other Ambulatory Visit: Payer: Self-pay | Admitting: Nurse Practitioner

## 2014-08-21 ENCOUNTER — Other Ambulatory Visit: Payer: Self-pay | Admitting: *Deleted

## 2014-08-21 MED ORDER — OXYCODONE HCL 20 MG PO TABS: ORAL_TABLET | ORAL | Status: DC

## 2014-08-21 MED ORDER — ALPRAZOLAM 1 MG PO TABS: 1.0000 mg | ORAL_TABLET | Freq: Three times a day (TID) | ORAL | Status: DC

## 2014-08-21 NOTE — Telephone Encounter (Signed)
Patient requested and will pick up 

## 2014-09-03 ENCOUNTER — Other Ambulatory Visit: Payer: 59

## 2014-09-05 ENCOUNTER — Encounter: Payer: 59 | Admitting: Internal Medicine

## 2014-09-18 ENCOUNTER — Other Ambulatory Visit: Payer: Self-pay | Admitting: *Deleted

## 2014-09-18 MED ORDER — OXYCODONE HCL 20 MG PO TABS: ORAL_TABLET | ORAL | Status: DC

## 2014-09-18 MED ORDER — ALPRAZOLAM 1 MG PO TABS: 1.0000 mg | ORAL_TABLET | Freq: Three times a day (TID) | ORAL | Status: DC

## 2014-09-18 NOTE — Telephone Encounter (Signed)
Patient requested and will pick up 

## 2014-10-05 ENCOUNTER — Other Ambulatory Visit: Payer: 59

## 2014-10-08 ENCOUNTER — Other Ambulatory Visit: Payer: Self-pay

## 2014-10-09 ENCOUNTER — Ambulatory Visit (INDEPENDENT_AMBULATORY_CARE_PROVIDER_SITE_OTHER): Payer: 59 | Admitting: Internal Medicine

## 2014-10-09 ENCOUNTER — Encounter: Payer: Self-pay | Admitting: Internal Medicine

## 2014-10-09 VITALS — BP 134/90 | HR 89 | Temp 98.3°F | Resp 10 | Ht 68.0 in | Wt 169.4 lb

## 2014-10-09 DIAGNOSIS — I4510 Unspecified right bundle-branch block: Secondary | ICD-10-CM

## 2014-10-09 DIAGNOSIS — F329 Major depressive disorder, single episode, unspecified: Secondary | ICD-10-CM

## 2014-10-09 DIAGNOSIS — I10 Essential (primary) hypertension: Secondary | ICD-10-CM

## 2014-10-09 DIAGNOSIS — F32A Depression, unspecified: Secondary | ICD-10-CM

## 2014-10-09 DIAGNOSIS — F411 Generalized anxiety disorder: Secondary | ICD-10-CM

## 2014-10-09 DIAGNOSIS — M2550 Pain in unspecified joint: Secondary | ICD-10-CM

## 2014-10-09 DIAGNOSIS — M5416 Radiculopathy, lumbar region: Secondary | ICD-10-CM

## 2014-10-09 DIAGNOSIS — IMO0002 Reserved for concepts with insufficient information to code with codable children: Secondary | ICD-10-CM

## 2014-10-09 DIAGNOSIS — E785 Hyperlipidemia, unspecified: Secondary | ICD-10-CM

## 2014-10-09 DIAGNOSIS — E1165 Type 2 diabetes mellitus with hyperglycemia: Secondary | ICD-10-CM

## 2014-10-09 DIAGNOSIS — I450 Right fascicular block: Secondary | ICD-10-CM

## 2014-10-09 DIAGNOSIS — Z0000 Encounter for general adult medical examination without abnormal findings: Secondary | ICD-10-CM

## 2014-10-09 DIAGNOSIS — E1140 Type 2 diabetes mellitus with diabetic neuropathy, unspecified: Secondary | ICD-10-CM

## 2014-10-09 HISTORY — DX: Radiculopathy, lumbar region: M54.16

## 2014-10-09 MED ORDER — CITALOPRAM HYDROBROMIDE 20 MG PO TABS: 20.0000 mg | ORAL_TABLET | Freq: Every day | ORAL | Status: DC

## 2014-10-09 MED ORDER — OXYCODONE HCL 20 MG PO TABS: ORAL_TABLET | ORAL | Status: DC

## 2014-10-09 NOTE — Progress Notes (Signed)
Patient ID: Denise Webb, female   DOB: 1965/10/18, 49 y.o.   MRN: [id number]    Chief Complaint  Patient presents with  . Annual Exam    Yearly check-up, last lab 05/2015, No pap smear, no mammogram.   . Referral    For neuropathy   . Form Completion    Discuss handicap form   Allergies  Allergen Reactions  . Erythromycin Stearate     REACTION: causes anxiety, heart to race  . Morphine And Related Nausea And Vomiting    HPI:  49 year old patient is here for annual exam. She does not want her mammogram or pelvic exam this visit. She is concerned about her ongoing leg pain, numbness and tingling. She has shooting pain to both legs. Denies bowel or bladder incontinence. She would like the dosing of her pain medication or the frequency increased. She mentions that her finger joints are bothering her at present Reviewed recent nerve conduction study and MRI cervical and lumbar spine result. She was referred to gi last visit with ongoing diarrhea and abdominal pain. She had ct abdomen which was not revealing for her abdominal pain. Her reflux is under control with omeprazole Continues to smoke Unable to exercise due to aches and pain She would like her anxiety medication adjusted She feels depressed. Mentions seeing a psychiatrist in the past, mentions being on amitriptyline in past, no records for review. Hesitant about starting any other medication Taking her blood pressure and cholesterol medication Taking her diabetes medication Has not checked her bp or sugar at present Has refused influenza vaccine Denies suicidal ideation  Review of Systems:  Constitutional: Negative for fever, chills, weight loss, diaphoresis.  HENT: Negative for congestion, nasal discharge, hearing loss, earache, sore throat, difficulty swallowing.   Eyes: Negative for eye pain, blurred vision, double vision and discharge. wears reading glasses Respiratory: Negative for cough, shortness of breath and  wheezing.   Cardiovascular: Negative for chest pain, palpitations. Has leg swelling towards end of the day  Gastrointestinal: has history fo IBS with diarrhea, seen by GI, uptodate on colonoscopy and EGD in 2011. Takes prn imodium. Has frequent nausea and loose stool. Takes PPI  Genitourinary: Negative for dysuria, urgency, frequency, hematuria, incontinence, nocturia and flank pain.  Musculoskeletal: had a fall 2 weeks back when slipped in the shower and hit her head, no loss of consciousness. Ongoing multiple joints and muscle aches, has back pain, no assistive device used Skin: Negative for itching, rash.  Neurological: Negative for dizziness, focal weakness. Has tingling, pain and numbness in her feet and leg Psychiatric/Behavioral: Negative for memory loss. Has depression and anxiety   Past Medical History  Diagnosis Date  . Hypertension   . Colitis   . Plantar fasciitis   . Anxiety   . Depression   . Neuropathy     severe both feet/lower legs  . Hyperlipidemia   . TMJ (dislocation of temporomandibular joint)   . IBS (irritable bowel syndrome) 2011-2014    ER visits only   . GERD (gastroesophageal reflux disease)   . Internal hemorrhoids   . Trichomoniasis    Past Surgical History  Procedure Laterality Date  . Tubal ligation    . Cesarean section  1988    [facility]  . Pelvic exenteration  1990    Uterus Frozen (cancer)  . Tubes tied  2004/2005    [facility]   Social History:   reports that she has been smoking Cigarettes.  She has been smoking about  1.00 pack per day. She has never used smokeless tobacco. She reports that she drinks alcohol. She reports that she does not use illicit drugs.  Family History  Problem Relation Age of Onset  . Heart disease Mother   . Heart attack Mother   . Heart attack Father   . Cancer Brother     colon cancer  . Colitis Brother   . Cancer Brother 69    lung cancer  . Heart disease Brother   . Anxiety disorder Brother   . Heart  attack Other     Medications: Patient's Medications  New Prescriptions   No medications on file  Previous Medications   ALPRAZOLAM (XANAX) 1 MG TABLET    Take 1 tablet (1 mg total) by mouth 3 (three) times daily.   ASPIRIN-ACETAMINOPHEN-CAFFEINE (EXCEDRIN MIGRAINE) 250-250-65 MG PER TABLET    Take by mouth every 6 (six) hours as needed for headache.   ATENOLOL (TENORMIN) 25 MG TABLET    Take 1 tablet (25 mg total) by mouth daily.   ATORVASTATIN (LIPITOR) 10 MG TABLET    Take 1 tablet (10 mg total) by mouth daily.   CALCIUM ACETATE, PHOS BINDER, (CALCIUM ACETATE PO)    Take by mouth. By mouth every other day.   GABAPENTIN (NEURONTIN) 400 MG CAPSULE    Take 1 capsule (400 mg total) by mouth 3 (three) times daily.   GLUCOSE BLOOD TEST STRIP    1 each by Other route as needed for other. Reli on prime: check blood sugar twice daily DX: 250.62   HYDROCHLOROTHIAZIDE (HYDRODIURIL) 25 MG TABLET    Take 1 tablet (25 mg total) by mouth daily.   LACTOBACILLUS (ACIDOPHILUS PROBIOTIC) 100 MG CAPS    Take 1 capsule (100 mg total) by mouth daily.   LOPERAMIDE (IMODIUM A-D) 2 MG TABLET    Take 2 mg by mouth 3 (three) times daily as needed. Take 2 tabs by mouth once daily as needed for diarrhea.   METFORMIN (GLUCOPHAGE) 500 MG TABLET    Take 1 tablet (500 mg total) by mouth 2 (two) times daily with a meal.   NEOMYCIN-BACITRACIN-POLYMYXIN (NEOSPORIN) [phone] OINTMENT    Apply daily to sores on feet   OMEPRAZOLE (PRILOSEC) 20 MG CAPSULE    Take 1 capsule (20 mg total) by mouth daily.   OXYCODONE HCL 20 MG TABS    Take one tablet by mouth three times daily for pain   PROMETHAZINE (PHENERGAN) 12.5 MG TABLET    TAKE 1 TABLET (12.5 MG TOTAL) BY MOUTH EVERY 8 (EIGHT) HOURS AS NEEDED FOR NAUSEA OR VOMITING.   TRIAMCINOLONE (NASACORT AQ) 55 MCG/ACT AERO NASAL INHALER    2 sprays in each nostril daily  Modified Medications   No medications on file  Discontinued Medications   ATENOLOL (TENORMIN) 25 MG TABLET     TAKE 1 TABLET (25 MG TOTAL) BY MOUTH DAILY.   HYDROCHLOROTHIAZIDE (HYDRODIURIL) 25 MG TABLET    TAKE 1 TABLET (25 MG TOTAL) BY MOUTH DAILY.   METFORMIN (GLUCOPHAGE) 500 MG TABLET    TAKE 1 TABLET (500 MG TOTAL) BY MOUTH 2 (TWO) TIMES DAILY WITH A MEAL.   PROBIOTIC PRODUCT (PROBIOTIC DAILY PO)    Take by mouth daily.     Physical Exam: Filed Vitals:   10/09/14 1022  BP: 134/90  Pulse: 89  Temp: 98.3 F (36.8 C)  TempSrc: Oral  Resp: 10  Height: 5' 8"  (1.727 m)  Weight: 169 lb 6.4 oz (76.839 kg)  SpO2: 97%   Wt Readings from Last 3 Encounters:  10/09/14 169 lb 6.4 oz (76.839 kg)  06/26/14 165 lb 3.2 oz (74.934 kg)  05/31/14 164 lb (74.39 kg)   General- adult female in no acute distress Head- atraumatic, normocephalic.  Ears- left ear normal tympanic membrane and normal external ear canal , right ear normal tympanic membrane and normal external ear canal Nose- normal nasal mucosa, no maxillary or frontal sinus tenderness, no nasal discharge Throat- moist mucus membrane, normal oropharynx Eyes- PERRLA, EOMI, no pallor, no icterus, no discharge Chest- no chest wall deformities, no chest wall tenderness, no leg edema Breast- normal appearance, no masses or lumps on palpation, normal nipple and areola exam, no axillary lymphadenopathy Cardiovascular- normal s1,s2, no murmurs Respiratory- bilateral clear to auscultation, no wheeze, no rhonchi, no crackles Abdomen- bowel sounds present, soft, non tender Musculoskeletal- able to move all 4 extremities, normal muscle strength, no spinal and paraspinal tenderness Neurological- no focal deficit, decreased pinprick sensation upto mid leg region, normal vibration sense, tenderness to light touch, normal co-ordination, no ataxia, normal DTR Psychiatry- alert and oriented to person, place and time, normal mood Skin- warm and dry Psychiatry- alert and oriented to person, place and time, tearful    Labs reviewed: Basic Metabolic  Panel:  Recent Labs  05/29/14 1444  NA 144  K 3.9  CL 98  CO2 26  GLUCOSE 117*  BUN 10  CREATININE 0.71  CALCIUM 10.3*   Liver Function Tests:  Recent Labs  05/29/14 1444  AST 17  ALT 18  ALKPHOS 80  BILITOT 0.2  PROT 7.4   No results for input(s): LIPASE, AMYLASE in the last 8760 hours. No results for input(s): AMMONIA in the last 8760 hours. CBC:  Recent Labs  05/29/14 1444  WBC 9.4  NEUTROABS 5.0  HGB 13.9  HCT 40.1  MCV 96   Lab Results  Component Value Date   HGBA1C 6.4* 05/29/2014    Radiological Exams:  EMG 06/18/14 IMPRESSION:  This is a normal study. There was no electrodiagnostic evidence of large fiber peripheral neuropathy, bilateral lumbosacral radiculopathy, or right cervical radiculopathy  Lumbar spine MRI 06/17/14 L2-3 shows normal disc signal and only minor facet arthropathic changes: L3-4: also mild facet arthropathy but no compression L4-5: shows similar findings L5-S1: shows prominent central herniated disc fragment with mild central canal narrowing and facet arthropathy resulting in mild right greater than left foraminal narrowing Limited views of the aorta, kidneys, iliopsoas muscles and sacroiliac joints are unremarkable. Abnormal MRI Lumbar spine showing central herniated disc at L 5-S1 with mild left more than right foraminal narrowing.  Cervical spine MRI 06/21/14 On sagittal views the vertebral bodies have normal height and alignment.  The spinal cord is normal in size and appearance. The posterior fossa, pituitary gland and paraspinal soft tissues are unremarkable.  On axial views there is no spinal stenosis or foraminal narrowing.There are only minor disc signal abnormalities. At C 5-6 there is mild disc osteophyte bulge to left. Limited views of the soft tissues of the head and neck are unremarkable.  Ct abdomen 06/27/14 1. No acute findings to account for the patient's symptoms. 2. Normal appendix. 3. 4.2 cm simple cyst in the  lower pole of the left kidney. 4. Additional incidental findings, as above.   EKG 10/09/14 NSR, normal PR interval and QRS complex, normal axis, QTc 460, incomplete RBBB   Assessment/Plan  1. DM type 2, uncontrolled, with neuropathy Will need recheck on her a1c,  no home cbg check, continue metformin 500 mg bid with neurontin 400 mg tid. Continue lipitor. No microalbuminuria. Dietary and exercise counselling provided  - CMP - CBC with Differential - TSH - Hemoglobin A1c  2. Essential hypertension, benign bp stable, continue atenolol 25 mg daily and hctz 25 mg daily - TSH  3. Hyperlipidemia LDL goal <100 Continue lipitor 10 mg daily  4. Depression Start celex 20 mg daily and reassess in 4-6 weeks. Will provide referral to psychiatry. Also check esr to assess for PMR - Ambulatory referral to Psychiatry - TSH - Sedimentation Rate  5. 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 Pt resistant to go to a pain clinic at first. Agrees now. 4 weeks supply of oxycodone provided for now. No further refills from this clinic on pain medication - Ambulatory referral to Pain Clinic  6. Generalized anxiety disorder Continue xanax 1 mg tid, psych referral  7. Routine general medical examination at a health care facility the patient was counseled regarding the appropriate use of alcohol, regular self-examination of the breasts on a monthly basis, prevention of dental and periodontal disease, diet, regular sustained exercise for at least 30 minutes 5 times per week, routine screening interval for mammogram as recommended by the [place] and ACOG, the proper use of sunscreen and protective clothing, tobacco use, and recommended schedule for GI hemoccult testing, colonoscopy, cholesterol, thyroid and  diabetes screening.  8. Multiple joint pain With her ongoing muscle aches and joint pain, concern for fibromyalgia vs PMR. Have referred her to pain clinic for further pain management. Will check esr to assess for inflammatory process. lipitor has been a chronic medication for her , thus less likely to be the cause  - Ambulatory referral to Pain Clinic - Sedimentation Rate - ANA  9. Joint pain Assess for RA - CYCLIC CITRUL PEPTIDE ANTIBODY, IGG/IGA  10. Incomplete RBBB Seen on ekg, new compared to ekg in 2013, get echocardiogram to assess for structural abnormalities and LV function - 2D Echocardiogram without contrast; Future

## 2014-10-11 ENCOUNTER — Telehealth: Payer: Self-pay | Admitting: Internal Medicine

## 2014-10-11 DIAGNOSIS — M5416 Radiculopathy, lumbar region: Secondary | ICD-10-CM

## 2014-10-11 LAB — COMPREHENSIVE METABOLIC PANEL
ALBUMIN: 5.5 g/dL (ref 3.5–5.5)
ALK PHOS: 103 IU/L (ref 39–117)
ALT: 26 IU/L (ref 0–32)
AST: 20 IU/L (ref 0–40)
Albumin/Globulin Ratio: 1.7 (ref 1.1–2.5)
BUN/Creatinine Ratio: 10 (ref 9–23)
BUN: 8 mg/dL (ref 6–24)
Bilirubin Total: 0.3 mg/dL (ref 0.0–1.2)
CO2: 22 mmol/L (ref 18–29)
CREATININE: 0.84 mg/dL (ref 0.57–1.00)
Calcium: 11.8 mg/dL — ABNORMAL HIGH (ref 8.7–10.2)
Chloride: 94 mmol/L — ABNORMAL LOW (ref 97–108)
GFR, EST AFRICAN AMERICAN: 96 mL/min/{1.73_m2} (ref >59)
GFR, EST NON AFRICAN AMERICAN: 84 mL/min/{1.73_m2} (ref >59)
GLOBULIN, TOTAL: 3.2 g/dL (ref 1.5–4.5)
GLUCOSE: 149 mg/dL — AB (ref 65–99)
Potassium: 4.6 mmol/L (ref 3.5–5.2)
Sodium: 142 mmol/L (ref 134–144)
TOTAL PROTEIN: 8.7 g/dL — AB (ref 6.0–8.5)

## 2014-10-11 LAB — CBC WITH DIFFERENTIAL/PLATELET
BASOS: 0 %
Basophils Absolute: 0 10*3/uL (ref 0.0–0.2)
EOS: 1 %
Eosinophils Absolute: 0.1 10*3/uL (ref 0.0–0.4)
HCT: 42.8 % (ref 34.0–46.6)
HEMOGLOBIN: 15.2 g/dL (ref 11.1–15.9)
IMMATURE GRANS (ABS): 0 10*3/uL (ref 0.0–0.1)
Immature Granulocytes: 0 %
LYMPHS: 31 %
Lymphocytes Absolute: 3 10*3/uL (ref 0.7–3.1)
MCH: 32.7 pg (ref 26.6–33.0)
MCHC: 35.5 g/dL (ref 31.5–35.7)
MCV: 92 fL (ref 79–97)
MONOCYTES: 5 %
Monocytes Absolute: 0.5 10*3/uL (ref 0.1–0.9)
NEUTROS PCT: 63 %
Neutrophils Absolute: 6 10*3/uL (ref 1.4–7.0)
Platelets: 388 10*3/uL — ABNORMAL HIGH (ref 150–379)
RBC: 4.65 x10E6/uL (ref 3.77–5.28)
RDW: 13.3 % (ref 12.3–15.4)
WBC: 9.6 10*3/uL (ref 3.4–10.8)

## 2014-10-11 LAB — ANA: ANA: NEGATIVE

## 2014-10-11 LAB — HEMOGLOBIN A1C
Est. average glucose Bld gHb Est-mCnc: 154 mg/dL
HEMOGLOBIN A1C: 7 % — AB (ref 4.8–5.6)

## 2014-10-11 LAB — TSH: TSH: 1.15 u[IU]/mL (ref 0.450–4.500)

## 2014-10-11 LAB — CYCLIC CITRUL PEPTIDE ANTIBODY, IGG/IGA: CYCLIC CITRULLIN PEPTIDE AB: 14 U (ref 0–19)

## 2014-10-11 LAB — SEDIMENTATION RATE: SED RATE: 18 mm/h (ref 0–32)

## 2014-10-11 NOTE — Telephone Encounter (Signed)
Patient called last night and left a message with Rudene Re asking about her referral for pain management, Caren Griffins & I were discussing this patient and she mentioned that Dr, Bubba Camp spoke to her about this referral and that at this time she did not want to refer to pain management and was more concerned about her getting her 2D Echo, that patient was only interested in getting pain meds. I called the patient back and explained to her that at this time Dr. Bubba Camp was not going to refer her to Pain Managment and she stated that that was not true that Dr. Bubba Camp told her to go get a Psych eval and to find out what pain management facility she would like to go to and call us back and she would place the referral.   I told the patient I would send a message to Dr. Bubba Camp   Patient wants to be referred to Dr. Vira Blanco at [facility] (Patient stated she called and spoke with Inez Catalina at their office already)  [address] [place], [place] [address] Phone: [phone] Fax: [phone]  Please Advise

## 2014-10-12 ENCOUNTER — Encounter: Payer: Self-pay | Admitting: Internal Medicine

## 2014-10-12 NOTE — Telephone Encounter (Signed)
Per Dr.Pandey patient is to have Echo Pain management referral  Call psych

## 2014-10-15 ENCOUNTER — Ambulatory Visit (HOSPITAL_COMMUNITY): Payer: 59 | Attending: Cardiovascular Disease | Admitting: Radiology

## 2014-10-15 DIAGNOSIS — I450 Right fascicular block: Secondary | ICD-10-CM | POA: Diagnosis not present

## 2014-10-15 DIAGNOSIS — I4510 Unspecified right bundle-branch block: Secondary | ICD-10-CM

## 2014-10-15 NOTE — Progress Notes (Signed)
Echocardiogram performed.  

## 2014-10-16 ENCOUNTER — Other Ambulatory Visit: Payer: Self-pay | Admitting: *Deleted

## 2014-10-16 ENCOUNTER — Telehealth: Payer: Self-pay | Admitting: Internal Medicine

## 2014-10-16 MED ORDER — ALPRAZOLAM 1 MG PO TABS: ORAL_TABLET | ORAL | Status: DC

## 2014-10-16 NOTE — Telephone Encounter (Signed)
Pt called wants results of Echo test.  Done on 10-15-2014

## 2014-10-16 NOTE — Telephone Encounter (Signed)
Need medication refill for Xanax called in or faxed to the pharmacy...[facility]

## 2014-10-16 NOTE — Telephone Encounter (Signed)
Patient requested. Per Dr. Wandra Feinstein give 30 day supply. Psychiatrist to provide thereafter.

## 2014-10-17 NOTE — Telephone Encounter (Signed)
Patient aware of echo results and verbalized understanding.  

## 2014-10-26 ENCOUNTER — Other Ambulatory Visit: Payer: Self-pay | Admitting: Internal Medicine

## 2014-10-29 ENCOUNTER — Other Ambulatory Visit: Payer: Self-pay | Admitting: Internal Medicine

## 2014-11-07 ENCOUNTER — Other Ambulatory Visit: Payer: 59

## 2014-11-07 ENCOUNTER — Telehealth: Payer: Self-pay

## 2014-11-07 DIAGNOSIS — Z79891 Long term (current) use of opiate analgesic: Secondary | ICD-10-CM

## 2014-11-07 NOTE — Telephone Encounter (Signed)
Left message on voicemail for patient to return call when available   

## 2014-11-07 NOTE — Telephone Encounter (Signed)
Message left triage voicemail requesting refill on Oxycodone. Patient just left [facility] and it will be at least 30 days befor any anxiety or pain medications can be filled. Patient was told by Dr.Naveira to call here for pain medication   I called Dr.Naveira's office and was told by the receptionist that when they first see the patient it is only for a consultation. Patient has to complete several requirements first and then they will take over prescribing medications. Patient was instructed to call PCP to see if we will continue to provide Oxycodone until they take over rx'ing meds.  Please advise

## 2014-11-07 NOTE — Telephone Encounter (Signed)
Spoke with patient, patient will come to the office today for UDS (urine drug screen)

## 2014-11-07 NOTE — Telephone Encounter (Signed)
Have her come in for urine drug screen today. i will review the result and then decide further.

## 2014-11-08 ENCOUNTER — Other Ambulatory Visit: Payer: Self-pay

## 2014-11-08 LAB — DRUG SCREEN, URINE
Amphetamines, Urine: NEGATIVE ng/mL
BARBITURATE SCREEN URINE: NEGATIVE ng/mL
BENZODIAZEPINE QUANT UR: POSITIVE ng/mL
Cannabinoid Quant, Ur: NEGATIVE ng/mL
Cocaine (Metab.): NEGATIVE ng/mL
OPIATE QUANT UR: POSITIVE ng/mL
PCP QUANT UR: NEGATIVE ng/mL

## 2014-11-08 MED ORDER — OXYCODONE HCL 20 MG PO TABS: ORAL_TABLET | ORAL | Status: DC

## 2014-11-08 NOTE — Telephone Encounter (Signed)
Per [place] drug screen ok to fill controlled medication (Oxycodone). Patient aware rx is ready for pick-up.

## 2014-11-13 ENCOUNTER — Encounter: Payer: Self-pay | Admitting: Internal Medicine

## 2014-11-13 ENCOUNTER — Ambulatory Visit (INDEPENDENT_AMBULATORY_CARE_PROVIDER_SITE_OTHER): Payer: 59 | Admitting: Internal Medicine

## 2014-11-13 VITALS — BP 100/68 | HR 96 | Temp 97.8°F | Resp 18 | Ht 68.0 in | Wt 176.2 lb

## 2014-11-13 DIAGNOSIS — F329 Major depressive disorder, single episode, unspecified: Secondary | ICD-10-CM | POA: Diagnosis not present

## 2014-11-13 DIAGNOSIS — F411 Generalized anxiety disorder: Secondary | ICD-10-CM

## 2014-11-13 DIAGNOSIS — E1142 Type 2 diabetes mellitus with diabetic polyneuropathy: Secondary | ICD-10-CM | POA: Diagnosis not present

## 2014-11-13 DIAGNOSIS — R0789 Other chest pain: Secondary | ICD-10-CM | POA: Diagnosis not present

## 2014-11-13 DIAGNOSIS — F32A Depression, unspecified: Secondary | ICD-10-CM

## 2014-11-13 MED ORDER — CITALOPRAM HYDROBROMIDE 40 MG PO TABS: 40.0000 mg | ORAL_TABLET | Freq: Every day | ORAL | Status: DC

## 2014-11-13 MED ORDER — ALPRAZOLAM 1 MG PO TABS: ORAL_TABLET | ORAL | Status: DC

## 2014-11-13 NOTE — Progress Notes (Signed)
Patient ID: Denise Webb, female   DOB: 1965-10-27, 49 y.o.   MRN: [id number]    Chief Complaint  Patient presents with  . Follow-up    5 week follow up for depression   Allergies  Allergen Reactions  . Erythromycin Stearate     REACTION: causes anxiety, heart to race  . Morphine And Related Nausea And Vomiting    HPI 49 y/o female pt is here for follow up on her depression Has appointment scheduled with Psychiatry mid of April 2016 at [facility]. Has been taking celexa 20 mg daily and xanax 1 mg tid. Her mood has improved some but feels it could be better Denies suicidal thoughts Denies crying spells Energy has not changed much but feels she sleeps better Has established with pain clinic, has follow up this afternoon with psychologist Getting oxcodone 30 mg tid prn from our clinic for this month while undergoing evaluation from pain clinic Pt mentions she is due for her xanax today  Review of Systems:  Constitutional: Negative for fever, chills, weight loss, diaphoresis.  HENT: Negative for congestion   Eyes: Negative for eye pain, blurred vision, double vision and discharge. wears reading glasses Respiratory: Negative for cough, shortness of breath and wheezing.   Cardiovascular: Negative for chest pain, palpitations.  Gastrointestinal: has history fo IBS with diarrhea, seen by GI, uptodate on colonoscopy and EGD in 2011. Takes prn imodium. Has frequent nausea and loose stool. Takes PPI   Genitourinary: Negative for dysuria, urgency, frequency, hematuria, incontinence, nocturia and flank pain.  Musculoskeletal: had a fall 1 week back when she tripped on her room shoes and pulled her chest muscles. Would like some muscle relaxant,flexeril works for her. no loss of consciousness. Ongoing multiple joints and muscle aches, has back pain, no assistive device used Skin: Negative for itching, rash.  Neurological: Negative for dizziness, focal weakness. Has  tingling, pain and numbness in her feet and leg Psychiatric/Behavioral: Negative for memory loss. Has depression and anxiety   Past Medical History  Diagnosis Date  . Hypertension   . Colitis   . Plantar fasciitis   . Anxiety   . Depression   . Neuropathy     severe both feet/lower legs  . Hyperlipidemia   . TMJ (dislocation of temporomandibular joint)   . IBS (irritable bowel syndrome) 2011-2014    ER visits only   . GERD (gastroesophageal reflux disease)   . Internal hemorrhoids   . Trichomoniasis    Current Outpatient Prescriptions on File Prior to Visit  Medication Sig Dispense Refill  . aspirin-acetaminophen-caffeine (EXCEDRIN MIGRAINE) 250-250-65 MG per tablet Take by mouth every 6 (six) hours as needed for headache.    Marland Kitchen atenolol (TENORMIN) 25 MG tablet Take 1 tablet (25 mg total) by mouth daily. (Patient taking differently: Take 25 mg by mouth daily. Easy pop top on all medications) 90 tablet 3  . atorvastatin (LIPITOR) 10 MG tablet Take 1 tablet (10 mg total) by mouth daily. (Patient taking differently: Take 10 mg by mouth daily. Easy pop top on all medications) 90 tablet 3  . Calcium Acetate, Phos Binder, (CALCIUM ACETATE PO) Take by mouth. By mouth every other day.    . gabapentin (NEURONTIN) 400 MG capsule TAKE 1 CAPSULE (400 MG TOTAL) BY MOUTH 3 (THREE) TIMES DAILY. 90 capsule 3  . glucose blood test strip 1 each by Other route as needed for other. Reli on prime: check blood sugar twice daily DX: 250.62    . hydrochlorothiazide (  HYDRODIURIL) 25 MG tablet Take 1 tablet (25 mg total) by mouth daily. (Patient taking differently: Take 25 mg by mouth daily. Easy pop top on all medications) 90 tablet 3  . Lactobacillus (ACIDOPHILUS PROBIOTIC) 100 MG CAPS Take 1 capsule (100 mg total) by mouth daily. 30 capsule 3  . loperamide (IMODIUM A-D) 2 MG tablet Take 2 mg by mouth 3 (three) times daily as needed. Take 2 tabs by mouth once daily as needed for diarrhea.    . metFORMIN  (GLUCOPHAGE) 500 MG tablet Take 1 tablet (500 mg total) by mouth 2 (two) times daily with a meal. (Patient taking differently: Take 500 mg by mouth 2 (two) times daily with a meal. Easy pop top on all medications) 180 tablet 3  . neomycin-bacitracin-polymyxin (NEOSPORIN) [phone] ointment Apply daily to sores on feet 28.3 g 0  . omeprazole (PRILOSEC) 20 MG capsule Take 1 capsule (20 mg total) by mouth daily. (Patient taking differently: Take 20 mg by mouth daily. Easy pop top on all medications) 30 capsule 6  . Oxycodone HCl 20 MG TABS Take one tablet by mouth three times daily as needed for pain 90 tablet 0  . promethazine (PHENERGAN) 12.5 MG tablet TAKE 1 TABLET (12.5 MG TOTAL) BY MOUTH EVERY 8 (EIGHT) HOURS AS NEEDED FOR NAUSEA OR VOMITING. (Patient taking differently: TAKE 1 TABLET (12.5 MG TOTAL) BY MOUTH EVERY 8 (EIGHT) HOURS AS NEEDED FOR NAUSEA OR VOMITING. Easy pop top on all medications) 90 tablet 0  . triamcinolone (NASACORT AQ) 55 MCG/ACT AERO nasal inhaler 2 sprays in each nostril daily (Patient taking differently: 2 sprays in each nostril daily Easy pop top on all medications) 1 Inhaler 1   No current facility-administered medications on file prior to visit.    Physical exam BP 100/68 mmHg  Pulse 96  Temp(Src) 97.8 F (36.6 C) (Oral)  Resp 18  Ht 5\' 8"  (1.727 m)  Wt 176 lb 3.2 oz (79.924 kg)  BMI 26.80 kg/m2  SpO2 95%  LMP 12/14/2011  General- adult female in no acute distress Head- atraumatic, normocephalic.  Throat- moist mucus membrane Eyes- PERRLA, EOMI, no pallor, no icterus, no discharge Chest- no chest wall deformities, right lateral chest wall tenderness on palpation, no leg edema Cardiovascular- normal s1,s2, no murmurs Respiratory- bilateral clear to auscultation, no wheeze, no rhonchi, no crackles Abdomen- bowel sounds present, soft, non tender Musculoskeletal- able to move all 4 extremities, no spinal or paraspinal tenderness, no muscle spasm Neurological-  no focal deficit Psychiatry- alert and oriented to person, place and time, normal mood Skin- warm and dry Psychiatry- alert and oriented to person, place and time, mood and affect normal today  Lab Results  Component Value Date   HGBA1C 7.0* 10/09/2014   Lab Results  Component Value Date   CREATININE 0.84 10/09/2014   Assessment/plan  1. Depression Change celexa to 40 mg daily, has upcoming appointment with psychiatry, reassess further  2. Generalized anxiety disorder Stable with xanax 1 mg tid, change to 1 mg tid prn for now, refill on 30 days of script provided, further refill from psychiatry after establishing care  3. Musculoskeletal chest pain Post fall. No bruising at present, soreness on exam. No muscle spasm on exam. Advised to take OTC  Ibuprofen prn with food to help with her pain  4. Type 2 diabetes mellitus with diabetic polyneuropathy Reviewed a1c, continue metformin bid, diet and lifestyle modification counselling. Continue statin

## 2014-12-24 ENCOUNTER — Other Ambulatory Visit: Payer: Self-pay | Admitting: *Deleted

## 2014-12-24 MED ORDER — ALPRAZOLAM 1 MG PO TABS: ORAL_TABLET | ORAL | Status: DC

## 2014-12-24 NOTE — Telephone Encounter (Signed)
Patient requested and has an appointment end of July to see psych. For them to start filling medication.

## 2015-01-28 ENCOUNTER — Telehealth: Payer: Self-pay | Admitting: *Deleted

## 2015-01-28 MED ORDER — ALPRAZOLAM 1 MG PO TABS: ORAL_TABLET | ORAL | Status: DC

## 2015-01-28 NOTE — Telephone Encounter (Signed)
Refilled and patient notified 

## 2015-01-28 NOTE — Telephone Encounter (Signed)
Patient called wanting refill on her Xanax. I reviewed Dr. Jackolyn Confer last [place] note in March and it stated that patient would need to get through Psychiatry. I called patient and she stated that she has not gotten an appointment with psychiatry yet because her insurance is messed up with [facility]. She stated that is was going to be at least another week before they could schedule an appointment. Wants to know if we will refill for another month. Please Advise.

## 2015-01-28 NOTE — Telephone Encounter (Signed)
Let's refill one more month and make not of this on Rx so she can get it from psychiatry in the future.  Not sure who is going to be her PCP now.

## 2015-01-29 ENCOUNTER — Other Ambulatory Visit: Payer: Self-pay | Admitting: Internal Medicine

## 2015-02-10 ENCOUNTER — Other Ambulatory Visit: Payer: Self-pay | Admitting: Internal Medicine

## 2015-02-22 ENCOUNTER — Encounter: Payer: Self-pay | Admitting: Internal Medicine

## 2015-02-22 ENCOUNTER — Ambulatory Visit (INDEPENDENT_AMBULATORY_CARE_PROVIDER_SITE_OTHER): Payer: 59 | Admitting: Internal Medicine

## 2015-02-22 VITALS — BP 110/72 | HR 65 | Temp 98.1°F | Ht 68.0 in | Wt 168.0 lb

## 2015-02-22 DIAGNOSIS — E785 Hyperlipidemia, unspecified: Secondary | ICD-10-CM

## 2015-02-22 DIAGNOSIS — R3989 Other symptoms and signs involving the genitourinary system: Secondary | ICD-10-CM

## 2015-02-22 DIAGNOSIS — F329 Major depressive disorder, single episode, unspecified: Secondary | ICD-10-CM

## 2015-02-22 DIAGNOSIS — F32A Depression, unspecified: Secondary | ICD-10-CM

## 2015-02-22 DIAGNOSIS — I10 Essential (primary) hypertension: Secondary | ICD-10-CM

## 2015-02-22 DIAGNOSIS — E1142 Type 2 diabetes mellitus with diabetic polyneuropathy: Secondary | ICD-10-CM | POA: Diagnosis not present

## 2015-02-22 DIAGNOSIS — M5416 Radiculopathy, lumbar region: Secondary | ICD-10-CM

## 2015-02-22 DIAGNOSIS — F411 Generalized anxiety disorder: Secondary | ICD-10-CM | POA: Diagnosis not present

## 2015-02-22 DIAGNOSIS — R1310 Dysphagia, unspecified: Secondary | ICD-10-CM | POA: Diagnosis not present

## 2015-02-22 DIAGNOSIS — R39198 Other difficulties with micturition: Secondary | ICD-10-CM

## 2015-02-22 MED ORDER — ALPRAZOLAM 1 MG PO TABS: ORAL_TABLET | ORAL | Status: DC

## 2015-02-22 NOTE — Progress Notes (Signed)
Patient ID: Denise Webb, female   DOB: 22-Mar-1966, 49 y.o.   MRN: [id number]    Location:    [facility]   Place of Service:   OFFICE  Chief Complaint  Patient presents with  . Medical Management of Chronic Issues    3 month follow-up, labs completed 09/2014   . Insect Bite    Tick bites- ? testing for lyme disease   . Medication Management    Request for topical medication for neuropathy in feet/legs     HPI:  49 yo female seen today for f/u. She reports recent exposure to sumac but it resolved. She has neuropathy in hands and feet. She would like topical med if possible. She sees pain mx ([facility]) and has requested nerve block to help and so she can stop pain pills. She tripped over her feet a few days ago and fell down 3 steps. She hit her head but no LOC. Fall unwitnessed and she did not go to the ER for eval. Denies any new weakness, numbness or tingling. No loss of bowel/bladder control  She has anxiety and takes xanax for panic attacks. Has tried to wean off in the past without success. She does not see mental health specialist as none locally will accept her insurance.   She is c/a "warm" urine after voiding then voiding some more. She reports mother and sister had to have their bladders stretched. She has never seen a urologist  She has dysphagia to pills. Hx enlarged thyroid but labs were nml. MRI of C spine in 2015 revealed bone spur on left.  She would like to be checked for Lyme's disease as she has pulled x 2 tiny ticks off skin. One left a knot. No rash seen. No new arthralgias, HA or dizziness. No f/c  She had elevated calcium with her last labs and reduced her po calcium to 3 times weekly now.  She has significant hot flashes worse at night  Past Medical History  Diagnosis Date  . Hypertension   . Colitis   . Plantar fasciitis   . Anxiety   . Depression   . Neuropathy     severe both feet/lower legs  . Hyperlipidemia   . TMJ (dislocation of temporomandibular  joint)   . IBS (irritable bowel syndrome) 2011-2014    ER visits only   . GERD (gastroesophageal reflux disease)   . Internal hemorrhoids   . Trichomoniasis     Past Surgical History  Procedure Laterality Date  . Tubal ligation    . Cesarean section  1988    [facility]  . Pelvic exenteration  1990    Uterus Frozen (cancer)  . Tubes tied  2004/2005    [facility]    Patient Care Team: Gildardo Cranker, DO as PCP - General (Internal Medicine) Milinda Pointer, MD as Referring Physician (Internal Medicine)  History   Social History  . Marital Status: Married    Spouse Name: Micheal   . Number of Children: 1  . Years of Education: college   Occupational History  . disabled    Social History Main Topics  . Smoking status: Current Every Day Smoker -- 1.00 packs/day    Types: Cigarettes  . Smokeless tobacco: Never Used  . Alcohol Use: 0.6 oz/week    1 Glasses of wine per week     Comment: on weekend occassionally  . Drug Use: No  . Sexual Activity: Not on file   Other Topics Concern  . Not on file  Social History Narrative   Patient lives at home with her husband Hoover Browns)   Trying to get disability.   Education college    Right handed.     reports that she has been smoking Cigarettes.  She has been smoking about 1.00 pack per day. She has never used smokeless tobacco. She reports that she drinks about 0.6 oz of alcohol per week. She reports that she does not use illicit drugs.  Allergies  Allergen Reactions  . Erythromycin Stearate     REACTION: causes anxiety, heart to race  . Morphine And Related Nausea And Vomiting    Medications: Patient's Medications  New Prescriptions   No medications on file  Previous Medications   ALPRAZOLAM (XANAX) 1 MG TABLET    Take one tablet by mouth three times daily as needed for anxiety   ASPIRIN-ACETAMINOPHEN-CAFFEINE (EXCEDRIN MIGRAINE) 250-250-65 MG PER TABLET    Take by mouth every 6 (six) hours as needed for headache.    ATENOLOL (TENORMIN) 25 MG TABLET    Take 1 tablet (25 mg total) by mouth daily.   ATORVASTATIN (LIPITOR) 10 MG TABLET    Take 1 tablet (10 mg total) by mouth daily.   CALCIUM ACETATE, PHOS BINDER, (CALCIUM ACETATE PO)    Take by mouth. By mouth every other day.   CITALOPRAM (CELEXA) 40 MG TABLET    Take 1 tablet (40 mg total) by mouth daily.   GABAPENTIN (NEURONTIN) 400 MG CAPSULE    TAKE 1 CAPSULE (400 MG TOTAL) BY MOUTH 3 (THREE) TIMES DAILY.   GLUCOSE BLOOD TEST STRIP    1 each by Other route as needed for other. Reli on prime: check blood sugar twice daily DX: 250.62   HYDROCHLOROTHIAZIDE (HYDRODIURIL) 25 MG TABLET    Take 1 tablet (25 mg total) by mouth daily.   LACTOBACILLUS (ACIDOPHILUS PROBIOTIC) 100 MG CAPS    Take 1 capsule (100 mg total) by mouth daily.   LOPERAMIDE (IMODIUM A-D) 2 MG TABLET    Take 2 mg by mouth 3 (three) times daily as needed. Take 2 tabs by mouth once daily as needed for diarrhea.   METFORMIN (GLUCOPHAGE) 500 MG TABLET    Take 1 tablet (500 mg total) by mouth 2 (two) times daily with a meal.   NEOMYCIN-BACITRACIN-POLYMYXIN (NEOSPORIN) [phone] OINTMENT    Apply daily to sores on feet   OMEPRAZOLE (PRILOSEC) 20 MG CAPSULE    Take 1 capsule (20 mg total) by mouth daily.   OXYCODONE HCL 20 MG TABS    Take one tablet by mouth three times daily as needed for pain   PROMETHAZINE (PHENERGAN) 12.5 MG TABLET    TAKE 1 TABLET (12.5 MG TOTAL) BY MOUTH EVERY 8 (EIGHT) HOURS AS NEEDED FOR NAUSEA OR VOMITING.   TRIAMCINOLONE (NASACORT AQ) 55 MCG/ACT AERO NASAL INHALER    2 sprays in each nostril daily  Modified Medications   No medications on file  Discontinued Medications   HYDROCHLOROTHIAZIDE (HYDRODIURIL) 25 MG TABLET    TAKE 1 TABLET (25 MG TOTAL) BY MOUTH DAILY.    Review of Systems  Constitutional: Negative for fever, chills, diaphoresis, activity change, appetite change and fatigue.  HENT: Positive for trouble swallowing. Negative for ear pain and sore throat.     Eyes: Negative for visual disturbance.  Respiratory: Negative for cough, chest tightness and shortness of breath.   Cardiovascular: Negative for chest pain, palpitations and leg swelling.  Gastrointestinal: Negative for nausea, vomiting, abdominal pain, diarrhea, constipation and blood  in stool.  Endocrine:       Hot flashes  Genitourinary: Positive for difficulty urinating. Negative for dysuria.  Musculoskeletal: Positive for back pain, arthralgias and gait problem.  Skin: Negative for rash.  Neurological: Positive for numbness. Negative for dizziness, tremors, seizures and headaches.  Psychiatric/Behavioral: Positive for behavioral problems and sleep disturbance. The patient is nervous/anxious.     Filed Vitals:   02/22/15 1451  BP: 110/72  Pulse: 65  Temp: 98.1 F (36.7 C)  TempSrc: Oral  Height: 5\' 8"  (1.727 m)  Weight: 168 lb (76.204 kg)  SpO2: 95%   Body mass index is 25.55 kg/(m^2).  Physical Exam  Constitutional: She is oriented to person, place, and time. She appears well-developed and well-nourished. No distress.  HENT:  Head: Normocephalic.  Mouth/Throat: Oropharynx is clear and moist. No oropharyngeal exudate.  Eyes: Pupils are equal, round, and reactive to light. No scleral icterus.  Neck: Neck supple. Carotid bruit is not present. No tracheal deviation present. No thyromegaly present.  Cardiovascular: Normal rate, regular rhythm and intact distal pulses.  Exam reveals no gallop and no friction rub.   Murmur (1/6 SEM) heard. Trace RLE edema but no LLE edema. no calf TTP.   Pulmonary/Chest: Effort normal and breath sounds normal. No stridor. No respiratory distress. She has no wheezes. She has no rales.  Abdominal: Soft. Bowel sounds are normal. She exhibits no distension and no mass. There is no hepatomegaly. There is tenderness (LLQ but no suprapubic or CVAT). There is no rebound and no guarding.  Musculoskeletal: She exhibits edema and tenderness.   Lymphadenopathy:    She has cervical adenopathy (NT).  Neurological: She is alert and oriented to person, place, and time. She has normal reflexes.  Skin: Skin is warm and dry. No rash noted.  Psychiatric: Thought content normal. Her mood appears anxious. Her speech is rapid and/or pressured. She is agitated.     Labs reviewed: No visits with results within 3 Month(s) from this visit. Latest known visit with results is:  Appointment on 11/07/2014  Component Date Value Ref Range Status  . Amphetamines, Urine 11/07/2014 Negative  Cutoff=1000 ng/mL Final   Amphetamine test includes Amphetamine and Methamphetamine.  Marland Kitchen BARBITURATE SCREEN URINE 11/07/2014 Negative  Cutoff=200 ng/mL Final  . Benzodiazepine Quant, Ur 11/07/2014 Positive  Cutoff=300 ng/mL Final  . Cannabinoid Quant, Ur 11/07/2014 Negative  Cutoff=50 ng/mL Final  . Cocaine (Metab.) 11/07/2014 Negative  Cutoff=300 ng/mL Final  . Opiate Quant, Ur 11/07/2014 Positive  Cutoff=300 ng/mL Final   Opiate test includes Codeine and Morphine only.  Marland Kitchen PCP Quant, Ur 11/07/2014 Negative  Cutoff=25 ng/mL Final  . Drug Screen Comment: 11/07/2014 Comment   Final   Comment: This assay provides a preliminary unconfirmed analytical test result that may be suitable for the clinical management of patients in certain situations.  For workplace drug testing programs, preliminary positive findings should always be confirmed by an alternative method. Some over-the-counter medications, as well as adulterants, may cause inaccurate results. Screen Only testing does not meet the [employer] of [employer] Forensic Urine Drug Testing Program requirements as a forensic urine drug test for workplace testing. All clients must ensure that their testing program conforms to applicable state and federal laws and employment agreements.     No results found.   Assessment/Plan   ICD-9-CM ICD-10-CM   1. Difficulty voiding 788.99 R39.89 Ambulatory  referral to Urology     Urinalysis with Reflex Microscopic  2. Generalized anxiety disorder - stable 300.02  F41.1 ALPRAZolam (XANAX) 1 MG tablet  3. Type 2 diabetes mellitus with diabetic polyneuropathy - controlled 250.60 E11.42 CMP   357.2  Hemoglobin A1c     Urinalysis with Reflex Microscopic     Microalbumin/Creatinine Ratio, Urine  4. Essential hypertension, benign - stable 401.1 I10   5. Hyperlipidemia LDL goal <100 - stable 272.4 E78.5 Lipid Panel  6. Lumbar radicular pain - stable 724.4 M54.16   7. Depression - stable 311 F32.9   8. Dysphagia to pills  787.20 R13.10     --Start specialized topical pain cream RX. Will call with topical medication order  --Continue other medications as ordered  --Follow up with pain specialist as scheduled  --Will call with referral appt  --she prefers to hold off on barium swallow to eval dysphagia as she had a similar study in the past. May need GI referral  --Follow up in 3 mos for routine visit  [place]. Perlie Gold  [facility] [address] [place], [place] [address] [phone] Cell (Monday-Friday 8 AM - 5 PM) [phone] After 5 PM and follow prompts

## 2015-02-22 NOTE — Patient Instructions (Signed)
Will call with topical medication order  Continue other medications as ordered  Follow up with pain specialist as scheduled  Will call with referral appt  Follow up in 3 mos for routine visit

## 2015-02-23 LAB — COMPREHENSIVE METABOLIC PANEL
ALK PHOS: 81 IU/L (ref 39–117)
ALT: 16 IU/L (ref 0–32)
AST: 20 IU/L (ref 0–40)
Albumin/Globulin Ratio: 1.8 (ref 1.1–2.5)
Albumin: 4.6 g/dL (ref 3.5–5.5)
BILIRUBIN TOTAL: 0.3 mg/dL (ref 0.0–1.2)
BUN / CREAT RATIO: 15 (ref 9–23)
BUN: 9 mg/dL (ref 6–24)
CALCIUM: 9.9 mg/dL (ref 8.7–10.2)
CO2: 26 mmol/L (ref 18–29)
CREATININE: 0.62 mg/dL (ref 0.57–1.00)
Chloride: 97 mmol/L (ref 97–108)
GFR calc non Af Amer: 108 mL/min/{1.73_m2} (ref >59)
GFR, EST AFRICAN AMERICAN: 124 mL/min/{1.73_m2} (ref >59)
GLUCOSE: 86 mg/dL (ref 65–99)
Globulin, Total: 2.6 g/dL (ref 1.5–4.5)
POTASSIUM: 3.9 mmol/L (ref 3.5–5.2)
SODIUM: 139 mmol/L (ref 134–144)
Total Protein: 7.2 g/dL (ref 6.0–8.5)

## 2015-02-23 LAB — URINALYSIS, ROUTINE W REFLEX MICROSCOPIC
Bilirubin, UA: NEGATIVE
GLUCOSE, UA: NEGATIVE
Ketones, UA: NEGATIVE
LEUKOCYTES UA: NEGATIVE
Nitrite, UA: NEGATIVE
Protein, UA: NEGATIVE
RBC, UA: NEGATIVE
Specific Gravity, UA: 1.01 (ref 1.005–1.030)
Urobilinogen, Ur: 0.2 mg/dL (ref 0.2–1.0)
pH, UA: 6.5 (ref 5.0–7.5)

## 2015-02-23 LAB — MICROALBUMIN / CREATININE URINE RATIO
Creatinine, Urine: 44.7 mg/dL
MICROALB/CREAT RATIO: <6.7 mg/g creat (ref 0.0–30.0)

## 2015-02-23 LAB — LIPID PANEL
CHOLESTEROL TOTAL: 163 mg/dL (ref 100–199)
Chol/HDL Ratio: 3.7 ratio units (ref 0.0–4.4)
HDL: 44 mg/dL (ref >39)
LDL CALC: 79 mg/dL (ref 0–99)
Triglycerides: 199 mg/dL — ABNORMAL HIGH (ref 0–149)
VLDL Cholesterol Cal: 40 mg/dL (ref 5–40)

## 2015-02-23 LAB — HEMOGLOBIN A1C
Est. average glucose Bld gHb Est-mCnc: 140 mg/dL
Hgb A1c MFr Bld: 6.5 % — ABNORMAL HIGH (ref 4.8–5.6)

## 2015-03-10 ENCOUNTER — Other Ambulatory Visit: Payer: Self-pay | Admitting: Internal Medicine

## 2015-03-29 ENCOUNTER — Other Ambulatory Visit: Payer: Self-pay | Admitting: *Deleted

## 2015-03-29 DIAGNOSIS — F411 Generalized anxiety disorder: Secondary | ICD-10-CM

## 2015-03-29 MED ORDER — ALPRAZOLAM 1 MG PO TABS: ORAL_TABLET | ORAL | Status: DC

## 2015-03-29 NOTE — Telephone Encounter (Signed)
Patient requested and will pick up 

## 2015-04-10 ENCOUNTER — Other Ambulatory Visit: Payer: Self-pay | Admitting: Internal Medicine

## 2015-04-26 ENCOUNTER — Other Ambulatory Visit: Payer: Self-pay

## 2015-04-26 DIAGNOSIS — F411 Generalized anxiety disorder: Secondary | ICD-10-CM

## 2015-04-26 MED ORDER — ALPRAZOLAM 1 MG PO TABS: ORAL_TABLET | ORAL | Status: DC

## 2015-04-26 NOTE — Telephone Encounter (Signed)
Received request for refill fax to [facility]

## 2015-05-24 ENCOUNTER — Other Ambulatory Visit: Payer: Self-pay | Admitting: *Deleted

## 2015-05-24 DIAGNOSIS — F411 Generalized anxiety disorder: Secondary | ICD-10-CM

## 2015-05-24 MED ORDER — ALPRAZOLAM 1 MG PO TABS: ORAL_TABLET | ORAL | Status: DC

## 2015-05-24 NOTE — Telephone Encounter (Signed)
Patient requested Rx to be faxed to pharmacy. While on call she stated that she had a blister on her foot and she was going to call back to schedule an appointment to have it looked at. Offered to go ahead and schedule but she stated that she will call back.

## 2015-05-31 ENCOUNTER — Ambulatory Visit: Payer: 59 | Admitting: Internal Medicine

## 2015-05-31 ENCOUNTER — Encounter: Payer: Self-pay | Admitting: Internal Medicine

## 2015-05-31 ENCOUNTER — Telehealth: Payer: Self-pay | Admitting: *Deleted

## 2015-05-31 NOTE — Telephone Encounter (Signed)
Ms Denise Webb with [employer] called and left message and stated that patient provided them with the last OV note as proof of not being able to work and she had a couple of questions.  I tried calling Ms Denise Webb back and LMOM to return call.

## 2015-06-03 NOTE — Telephone Encounter (Signed)
Patient called and stated that her [employer] Case Worker, Ms. Green needs a letter faxed to her at fax#: [phone] stating that patient can't work. Please Advise.

## 2015-06-04 NOTE — Telephone Encounter (Signed)
Letter printed and faxed to Ms Denise Webb at fax#: [phone]

## 2015-06-04 NOTE — Telephone Encounter (Signed)
Ok to send letter - pt unable to work due to uncontrolled psychiatric conditions and lower back pain with radiculopathy. She is awaiting a decision for disability status.

## 2015-06-07 ENCOUNTER — Ambulatory Visit: Payer: 59 | Admitting: Internal Medicine

## 2015-06-19 ENCOUNTER — Other Ambulatory Visit: Payer: Self-pay | Admitting: Pain Medicine

## 2015-06-24 ENCOUNTER — Other Ambulatory Visit: Payer: Self-pay | Admitting: Pain Medicine

## 2015-06-24 ENCOUNTER — Ambulatory Visit: Payer: 59 | Attending: Pain Medicine | Admitting: Pain Medicine

## 2015-06-24 ENCOUNTER — Encounter: Payer: Self-pay | Admitting: Pain Medicine

## 2015-06-24 VITALS — BP 145/101 | HR 76 | Temp 98.0°F | Resp 18 | Ht 68.0 in | Wt 165.0 lb

## 2015-06-24 DIAGNOSIS — E1142 Type 2 diabetes mellitus with diabetic polyneuropathy: Secondary | ICD-10-CM

## 2015-06-24 DIAGNOSIS — K219 Gastro-esophageal reflux disease without esophagitis: Secondary | ICD-10-CM

## 2015-06-24 DIAGNOSIS — F1120 Opioid dependence, uncomplicated: Secondary | ICD-10-CM

## 2015-06-24 DIAGNOSIS — Z8669 Personal history of other diseases of the nervous system and sense organs: Secondary | ICD-10-CM

## 2015-06-24 DIAGNOSIS — M79673 Pain in unspecified foot: Secondary | ICD-10-CM | POA: Diagnosis present

## 2015-06-24 DIAGNOSIS — M47812 Spondylosis without myelopathy or radiculopathy, cervical region: Secondary | ICD-10-CM | POA: Diagnosis not present

## 2015-06-24 DIAGNOSIS — Z79891 Long term (current) use of opiate analgesic: Secondary | ICD-10-CM

## 2015-06-24 DIAGNOSIS — K589 Irritable bowel syndrome without diarrhea: Secondary | ICD-10-CM | POA: Insufficient documentation

## 2015-06-24 DIAGNOSIS — Z87828 Personal history of other (healed) physical injury and trauma: Secondary | ICD-10-CM

## 2015-06-24 DIAGNOSIS — E785 Hyperlipidemia, unspecified: Secondary | ICD-10-CM | POA: Insufficient documentation

## 2015-06-24 DIAGNOSIS — G4730 Sleep apnea, unspecified: Secondary | ICD-10-CM

## 2015-06-24 DIAGNOSIS — M545 Low back pain, unspecified: Secondary | ICD-10-CM | POA: Insufficient documentation

## 2015-06-24 DIAGNOSIS — F17210 Nicotine dependence, cigarettes, uncomplicated: Secondary | ICD-10-CM | POA: Insufficient documentation

## 2015-06-24 DIAGNOSIS — K449 Diaphragmatic hernia without obstruction or gangrene: Secondary | ICD-10-CM

## 2015-06-24 DIAGNOSIS — M5126 Other intervertebral disc displacement, lumbar region: Secondary | ICD-10-CM

## 2015-06-24 DIAGNOSIS — G8929 Other chronic pain: Secondary | ICD-10-CM

## 2015-06-24 DIAGNOSIS — M5020 Other cervical disc displacement, unspecified cervical region: Secondary | ICD-10-CM

## 2015-06-24 DIAGNOSIS — M5030 Other cervical disc degeneration, unspecified cervical region: Secondary | ICD-10-CM

## 2015-06-24 DIAGNOSIS — M542 Cervicalgia: Secondary | ICD-10-CM

## 2015-06-24 DIAGNOSIS — G43909 Migraine, unspecified, not intractable, without status migrainosus: Secondary | ICD-10-CM | POA: Diagnosis not present

## 2015-06-24 DIAGNOSIS — F1190 Opioid use, unspecified, uncomplicated: Secondary | ICD-10-CM | POA: Diagnosis not present

## 2015-06-24 DIAGNOSIS — Z79899 Other long term (current) drug therapy: Secondary | ICD-10-CM

## 2015-06-24 DIAGNOSIS — E1140 Type 2 diabetes mellitus with diabetic neuropathy, unspecified: Secondary | ICD-10-CM | POA: Diagnosis not present

## 2015-06-24 DIAGNOSIS — I10 Essential (primary) hypertension: Secondary | ICD-10-CM | POA: Diagnosis not present

## 2015-06-24 DIAGNOSIS — M47896 Other spondylosis, lumbar region: Secondary | ICD-10-CM

## 2015-06-24 DIAGNOSIS — Z5181 Encounter for therapeutic drug level monitoring: Secondary | ICD-10-CM

## 2015-06-24 DIAGNOSIS — G5792 Unspecified mononeuropathy of left lower limb: Secondary | ICD-10-CM

## 2015-06-24 DIAGNOSIS — G5791 Unspecified mononeuropathy of right lower limb: Secondary | ICD-10-CM

## 2015-06-24 DIAGNOSIS — G5793 Unspecified mononeuropathy of bilateral lower limbs: Secondary | ICD-10-CM

## 2015-06-24 DIAGNOSIS — M549 Dorsalgia, unspecified: Secondary | ICD-10-CM | POA: Diagnosis present

## 2015-06-24 DIAGNOSIS — M47816 Spondylosis without myelopathy or radiculopathy, lumbar region: Secondary | ICD-10-CM

## 2015-06-24 HISTORY — DX: Type 2 diabetes mellitus with diabetic polyneuropathy: E11.42

## 2015-06-24 MED ORDER — GABAPENTIN 600 MG PO TABS: 600.0000 mg | ORAL_TABLET | Freq: Three times a day (TID) | ORAL | Status: DC

## 2015-06-24 MED ORDER — OXYCODONE HCL 20 MG PO TABS: ORAL_TABLET | ORAL | Status: DC

## 2015-06-24 MED ORDER — OXYCODONE HCL 20 MG PO TABS: 20.0000 mg | ORAL_TABLET | Freq: Three times a day (TID) | ORAL | Status: DC | PRN

## 2015-06-24 MED ORDER — GABAPENTIN 600 MG PO TABS
600.0000 mg | ORAL_TABLET | Freq: Three times a day (TID) | ORAL | Status: DC
Start: 2015-06-24 — End: 2015-06-24

## 2015-06-24 NOTE — Patient Instructions (Addendum)
Steps to Quit Smoking  Smoking tobacco can be harmful to your health and can affect almost every organ in your body. Smoking puts you, and those around you, at risk for developing many serious chronic diseases. Quitting smoking is difficult, but it is one of the best things that you can do for your health. It is never too late to quit. WHAT ARE THE BENEFITS OF QUITTING SMOKING? When you quit smoking, you lower your risk of developing serious diseases and conditions, such as: 1. Lung cancer or lung disease, such as COPD. 2. Heart disease. 3. Stroke. 4. Heart attack. 5. Infertility. 6. Osteoporosis and bone fractures. Additionally, symptoms such as coughing, wheezing, and shortness of breath may get better when you quit. You may also find that you get sick less often because your body is stronger at fighting off colds and infections. If you are pregnant, quitting smoking can help to reduce your chances of having a baby of low birth weight. HOW DO I GET READY TO QUIT? When you decide to quit smoking, create a plan to make sure that you are successful. Before you quit:  Pick a date to quit. Set a date within the next two weeks to give you time to prepare.  Write down the reasons why you are quitting. Keep this list in places where you will see it often, such as on your bathroom mirror or in your car or wallet.  Identify the people, places, things, and activities that make you want to smoke (triggers) and avoid them. Make sure to take these actions:  Throw away all cigarettes at home, at work, and in your car.  Throw away smoking accessories, such as ashtrays and lighters.  Clean your car and make sure to empty the ashtray.  Clean your home, including curtains and carpets.  Tell your family, friends, and coworkers that you are quitting. Support from your loved ones can make quitting easier.  Talk with your health care provider about your options for quitting smoking.  Find out what  treatment options are covered by your health insurance. WHAT STRATEGIES CAN I USE TO QUIT SMOKING?  Talk with your healthcare provider about different strategies to quit smoking. Some strategies include: 1. Quitting smoking altogether instead of gradually lessening how much you smoke over a period of time. Research shows that quitting "cold turkey" is more successful than gradually quitting. 2. Attending in-person counseling to help you build problem-solving skills. You are more likely to have success in quitting if you attend several counseling sessions. Even short sessions of 10 minutes can be effective. 3. Finding resources and support systems that can help you to quit smoking and remain smoke-free after you quit. These resources are most helpful when you use them often. They can include: 1. Online chats with a counselor. 2. Telephone quitlines. 3. Printed self-help materials. 4. Support groups or group counseling. 5. Text messaging programs. 6. Mobile phone applications. 4. Taking medicines to help you quit smoking. (If you are pregnant or breastfeeding, talk with your health care provider first.) Some medicines contain nicotine and some do not. Both types of medicines help with cravings, but the medicines that include nicotine help to relieve withdrawal symptoms. Your health care provider may recommend: 1. Nicotine patches, gum, or lozenges. 2. Nicotine inhalers or sprays. 3. Non-nicotine medicine that is taken by mouth. Talk with your health care provider about combining strategies, such as taking medicines while you are also receiving in-person counseling. Using these two strategies together makes   you more likely to succeed in quitting than if you used either strategy on its own. If you are pregnant or breastfeeding, talk with your health care provider about finding counseling or other support strategies to quit smoking. Do not take medicine to help you quit smoking unless told to do so by  your health care provider. WHAT THINGS CAN I DO TO MAKE IT EASIER TO QUIT? Quitting smoking might feel overwhelming at first, but there is a lot that you can do to make it easier. Take these important actions:  Reach out to your family and friends and ask that they support and encourage you during this time. Call telephone quitlines, reach out to support groups, or work with a counselor for support.  Ask people who smoke to avoid smoking around you.  Avoid places that trigger you to smoke, such as bars, parties, or smoke-break areas at work.  Spend time around people who do not smoke.  Lessen stress in your life, because stress can be a smoking trigger for some people. To lessen stress, try:  Exercising regularly.  Deep-breathing exercises.  Yoga.  Meditating.  Performing a body scan. This involves closing your eyes, scanning your body from head to toe, and noticing which parts of your body are particularly tense. Purposefully relax the muscles in those areas.  Download or purchase mobile phone or tablet apps (applications) that can help you stick to your quit plan by providing reminders, tips, and encouragement. There are many free apps, such as QuitGuide from the [employer] manager for Disease Control and Prevention). You can find other support for quitting smoking (smoking cessation) through smokefree.gov and other websites. HOW WILL I FEEL WHEN I QUIT SMOKING? Within the first 24 hours of quitting smoking, you may start to feel some withdrawal symptoms. These symptoms are usually most noticeable 2-3 days after quitting, but they usually do not last beyond 2-3 weeks. Changes or symptoms that you might experience include: 1. Mood swings. 2. Restlessness, anxiety, or irritation. 3. Difficulty concentrating. 4. Dizziness. 5. Strong cravings for sugary foods in addition to nicotine. 6. Mild weight gain. 7. Constipation. 8. Nausea. 9. Coughing or a sore throat. 10. Changes in how your  medicines work in your body. 11. A depressed mood. 12. Difficulty sleeping (insomnia). After the first 2-3 weeks of quitting, you may start to notice more positive results, such as: 1. Improved sense of smell and taste. 2. Decreased coughing and sore throat. 3. Slower heart rate. 4. Lower blood pressure. 5. Clearer skin. 6. The ability to breathe more easily. 7. Fewer sick days. Quitting smoking is very challenging for most people. Do not get discouraged if you are not successful the first time. Some people need to make many attempts to quit before they achieve long-term success. Do your best to stick to your quit plan, and talk with your health care provider if you have any questions or concerns.   This information is not intended to replace advice given to you by your health care provider. Make sure you discuss any questions you have with your health care provider.   Document Released: 08/04/2001 Document Revised: 12/25/2014 Document Reviewed: 12/25/2014 Elsevier Interactive Patient Education 2016 [employer]  What are the risk, side effects and possible complications? Generally speaking, most procedures are safe.  However, with any procedure there are risks, side effects, and the possibility of complications.  The risks and complications are dependent upon the sites that are lesioned, or the type  of nerve block to be performed.  The closer the procedure is to the spine, the more serious the risks are.  Great care is taken when placing the radio frequency needles, block needles or lesioning probes, but sometimes complications can occur. 1. Infection: Any time there is an injection through the skin, there is a risk of infection.  This is why sterile conditions are used for these blocks.  There are four possible types of infection. 1. Localized skin infection. 2. Central Nervous System Infection-This can be in the form of Meningitis, which can be  deadly. 3. Epidural Infections-This can be in the form of an epidural abscess, which can cause pressure inside of the spine, causing compression of the spinal cord with subsequent paralysis. This would require an emergency surgery to decompress, and there are no guarantees that the patient would recover from the paralysis. 4. Discitis-This is an infection of the intervertebral discs.  It occurs in about 1% of discography procedures.  It is difficult to treat and it may lead to surgery.        2. Pain: the needles have to go through skin and soft tissues, will cause soreness.       3. Damage to internal structures:  The nerves to be lesioned may be near blood vessels or    other nerves which can be potentially damaged.       4. Bleeding: Bleeding is more common if the patient is taking blood thinners such as  aspirin, Coumadin, Ticiid, Plavix, etc., or if he/she have some genetic predisposition  such as hemophilia. Bleeding into the spinal canal can cause compression of the spinal  cord with subsequent paralysis.  This would require an emergency surgery to  decompress and there are no guarantees that the patient would recover from the  paralysis.       5. Pneumothorax:  Puncturing of a lung is a possibility, every time a needle is introduced in  the area of the chest or upper back.  Pneumothorax refers to free air around the  collapsed lung(s), inside of the thoracic cavity (chest cavity).  Another two possible  complications related to a similar event would include: Hemothorax and Chylothorax.   These are variations of the Pneumothorax, where instead of air around the collapsed  lung(s), you may have blood or chyle, respectively.       6. Spinal headaches: They may occur with any procedures in the area of the spine.       7. Persistent CSF (Cerebro-Spinal Fluid) leakage: This is a rare problem, but may occur  with prolonged intrathecal or epidural catheters either due to the formation of a fistulous  track  or a dural tear.       8. Nerve damage: By working so close to the spinal cord, there is always a possibility of  nerve damage, which could be as serious as a permanent spinal cord injury with  paralysis.       9. Death:  Although rare, severe deadly allergic reactions known as "Anaphylactic  reaction" can occur to any of the medications used.      10. Worsening of the symptoms:  We can always make thing worse.  What are the chances of something like this happening? Chances of any of this occuring are extremely low.  By statistics, you have more of a chance of getting killed in a motor vehicle accident: while driving to the hospital than any of the above occurring .  Nevertheless,  you should be aware that they are possibilities.  In general, it is similar to taking a shower.  Everybody knows that you can slip, hit your head and get killed.  Does that mean that you should not shower again?  Nevertheless always keep in mind that statistics do not mean anything if you happen to be on the wrong side of them.  Even if a procedure has a 1 (one) in a 1,000,000 (million) chance of going wrong, it you happen to be that one..Also, keep in mind that by statistics, you have more of a chance of having something go wrong when taking medications.  Who should not have this procedure? If you are on a blood thinning medication (e.g. Coumadin, Plavix, see list of "Blood Thinners"), or if you have an active infection going on, you should not have the procedure.  If you are taking any blood thinners, please inform your physician.  How should I prepare for this procedure?  Do not eat or drink anything at least six hours prior to the procedure.  Bring a driver with you .  It cannot be a taxi.  Come accompanied by an adult that can drive you back, and that is strong enough to help you if your legs get weak or numb from the local anesthetic.  Take all of your medicines the morning of the procedure with just enough water  to swallow them.  If you have diabetes, make sure that you are scheduled to have your procedure done first thing in the morning, whenever possible.  If you have diabetes, take only half of your insulin dose and notify our nurse that you have done so as soon as you arrive at the clinic.  If you are diabetic, but only take blood sugar pills (oral hypoglycemic), then do not take them on the morning of your procedure.  You may take them after you have had the procedure.  Do not take aspirin or any aspirin-containing medications, at least eleven (11) days prior to the procedure.  They may prolong bleeding.  Wear loose fitting clothing that may be easy to take off and that you would not mind if it got stained with Betadine or blood.  Do not wear any jewelry or perfume  Remove any nail coloring.  It will interfere with some of our monitoring equipment.  NOTE: Remember that this is not meant to be interpreted as a complete list of all possible complications.  Unforeseen problems may occur.  BLOOD THINNERS The following drugs contain aspirin or other products, which can cause increased bleeding during surgery and should not be taken for 2 weeks prior to and 1 week after surgery.  If you should need take something for relief of minor pain, you may take acetaminophen which is found in Tylenol,m Datril, Anacin-3 and Panadol. It is not blood thinner. The products listed below are.  Do not take any of the products listed below in addition to any listed on your instruction sheet.  A.P.C or A.P.C with Codeine Codeine Phosphate Capsules #3 Ibuprofen Ridaura  ABC compound Congesprin Imuran rimadil  Advil Cope Indocin Robaxisal  Alka-Seltzer Effervescent Pain Reliever and Antacid Coricidin or Coricidin-D  Indomethacin Rufen  Alka-Seltzer plus Cold Medicine Cosprin Ketoprofen S-A-C Tablets  Anacin Analgesic Tablets or Capsules Coumadin Korlgesic Salflex  Anacin Extra Strength Analgesic tablets or capsules  CP-2 Tablets Lanoril Salicylate  Anaprox Cuprimine Capsules Levenox Salocol  Anexsia-D Dalteparin Magan Salsalate  Anodynos Darvon compound Magnesium Salicylate Sine-off  Ansaid  Dasin Capsules Magsal Sodium Salicylate  Anturane Depen Capsules Marnal Soma  APF Arthritis pain formula Dewitt's Pills Measurin Stanback  Argesic Dia-Gesic Meclofenamic Sulfinpyrazone  Arthritis Bayer Timed Release Aspirin Diclofenac Meclomen Sulindac  Arthritis pain formula Anacin Dicumarol Medipren Supac  Analgesic (Safety coated) Arthralgen Diffunasal Mefanamic Suprofen  Arthritis Strength Bufferin Dihydrocodeine Mepro Compound Suprol  Arthropan liquid Dopirydamole Methcarbomol with Aspirin Synalgos  ASA tablets/Enseals Disalcid Micrainin Tagament  Ascriptin Doan's Midol Talwin  Ascriptin A/D Dolene Mobidin Tanderil  Ascriptin Extra Strength Dolobid Moblgesic Ticlid  Ascriptin with Codeine Doloprin or Doloprin with Codeine Momentum Tolectin  Asperbuf Duoprin Mono-gesic Trendar  Aspergum Duradyne Motrin or Motrin IB Triminicin  Aspirin plain, buffered or enteric coated Durasal Myochrisine Trigesic  Aspirin Suppositories Easprin Nalfon Trillsate  Aspirin with Codeine Ecotrin Regular or Extra Strength Naprosyn Uracel  Atromid-S Efficin Naproxen Ursinus  Auranofin Capsules Elmiron Neocylate Vanquish  Axotal Emagrin Norgesic Verin  Azathioprine Empirin or Empirin with Codeine Normiflo Vitamin E  Azolid Emprazil Nuprin Voltaren  Bayer Aspirin plain, buffered or children's or timed BC Tablets or powders Encaprin Orgaran Warfarin Sodium  Buff-a-Comp Enoxaparin Orudis Zorpin  Buff-a-Comp with Codeine Equegesic Os-Cal-Gesic   Buffaprin Excedrin plain, buffered or Extra Strength Oxalid   Bufferin Arthritis Strength Feldene Oxphenbutazone   Bufferin plain or Extra Strength Feldene Capsules Oxycodone with Aspirin   Bufferin with Codeine Fenoprofen Fenoprofen Pabalate or Pabalate-SF   Buffets II Flogesic Panagesic    Buffinol plain or Extra Strength Florinal or Florinal with Codeine Panwarfarin   Buf-Tabs Flurbiprofen Penicillamine   Butalbital Compound Four-way cold tablets Penicillin   Butazolidin Fragmin Pepto-Bismol   Carbenicillin Geminisyn Percodan   Carna Arthritis Reliever Geopen Persantine   Carprofen Gold's salt Persistin   Chloramphenicol Goody's Phenylbutazone   Chloromycetin Haltrain Piroxlcam   Clmetidine heparin Plaquenil   Cllnoril Hyco-pap Ponstel   Clofibrate Hydroxy chloroquine Propoxyphen         Before stopping any of these medications, be sure to consult the physician who ordered them.  Some, such as Coumadin (Warfarin) are ordered to prevent or treat serious conditions such as "deep thrombosis", "pumonary embolisms", and other heart problems.  The amount of time that you may need off of the medication may also vary with the medication and the reason for which you were taking it.  If you are taking any of these medications, please make sure you notify your pain physician before you undergo any procedures.         Facet Blocks Patient Information  Description: The facets are joints in the spine between the vertebrae.  Like any joints in the body, facets can become irritated and painful.  Arthritis can also effect the facets.  By injecting steroids and local anesthetic in and around these joints, we can temporarily block the nerve supply to them.  Steroids act directly on irritated nerves and tissues to reduce selling and inflammation which often leads to decreased pain.  Facet blocks may be done anywhere along the spine from the neck to the low back depending upon the location of your pain.   After numbing the skin with local anesthetic (like Novocaine), a small needle is passed onto the facet joints under x-ray guidance.  You may experience a sensation of pressure while this is being done.  The entire block usually lasts about 15-25 minutes.   Conditions which may be treated  by facet blocks:   Low back/buttock pain  Neck/shoulder pain  Certain types of headaches  Preparation for the  injection:  5. Do not eat any solid food or dairy products within 6 hours of your appointment. 6. You may drink clear liquid up to 2 hours before appointment.  Clear liquids include water, black coffee, juice or soda.  No milk or cream please. 7. You may take your regular medication, including pain medications, with a sip of water before your appointment.  Diabetics should hold regular insulin (if taken separately) and take 1/2 normal NPH dose the morning of the procedure.  Carry some sugar containing items with you to your appointment. 8. A driver must accompany you and be prepared to drive you home after your procedure. 9. Bring all your current medications with you. 10. An IV may be inserted and sedation may be given at the discretion of the physician. 11. A blood pressure cuff, EKG and other monitors will often be applied during the procedure.  Some patients may need to have extra oxygen administered for a short period. 12. You will be asked to provide medical information, including your allergies and medications, prior to the procedure.  We must know immediately if you are taking blood thinners (like Coumadin/Warfarin) or if you are allergic to IV iodine contrast (dye).  We must know if you could possible be pregnant.  Possible side-effects:   Bleeding from needle site  Infection (rare, may require surgery)  Nerve injury (rare)  Numbness & tingling (temporary)  Difficulty urinating (rare, temporary)  Spinal headache (a headache worse with upright posture)  Light-headedness (temporary)  Pain at injection site (serveral days)  Decreased blood pressure (rare, temporary)  Weakness in arm/leg (temporary)  Pressure sensation in back/neck (temporary)   Call if you experience:   Fever/chills associated with headache or increased back/neck pain  Headache  worsened by an upright position  New onset, weakness or numbness of an extremity below the injection site  Hives or difficulty breathing (go to the emergency room)  Inflammation or drainage at the injection site(s)  Severe back/neck pain greater than usual  New symptoms which are concerning to you  Please note:  Although the local anesthetic injected can often make your back or neck feel good for several hours after the injection, the pain will likely return. It takes 3-7 days for steroids to work.  You may not notice any pain relief for at least one week.  If effective, we will often do a series of 2-3 injections spaced 3-6 weeks apart to maximally decrease your pain.  After the initial series, you may be a candidate for a more permanent nerve block of the facets.  If you have any questions, please call #[phone]) [facility]

## 2015-06-24 NOTE — Progress Notes (Signed)
Patient's Name: Denise Webb MRN: [id number] DOB: 1966-01-12 DOS: 06/24/2015  Primary Reason(s) for Visit: Encounter for Medication Management. Evaluate low back pain low back pain. CC: Foot Pain and Back Pain   HPI:   Denise Webb is a 49 y.o. year old, female patient, who returns today as an established patient. She has HYPERTRIGLYCERIDEMIA; ANXIETY DEPRESSION; TOBACCO ABUSE; Essential hypertension, benign; Irritable bowel syndrome; Abdominal pain, unspecified site; Leg pain, bilateral; Neuropathic pain; DM type 2, uncontrolled, with neuropathy ([place]); Other and unspecified hyperlipidemia; Polyneuropathic pain; Encounter for therapeutic drug monitoring; Neuropathic pain of both legs; Blister of foot without infection; Depression; Ingrown toenail; Hyperlipidemia LDL goal <100; Insomnia; Abnormality of gait; Paresthesia; Lumbar radicular pain; Generalized anxiety disorder; Multiple joint pain; 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[place]); Chronic pain; Long term current use of opiate analgesic; Long term prescription opiate use; Opiate use; Encounter for therapeutic drug level monitoring; Chronic low back pain; Lumbar facet syndrome, bilateral; Diabetic peripheral neuropathy associated with type 2 diabetes mellitus ([place]); Diabetic peripheral neuropathy ([place]); Opiate dependence ([place]); History of migraine; History of head injury; Sleep apnea; GERD (gastroesophageal reflux disease); Hiatal hernia; Lumbar facet arthropathy (L2-3, L3-4, L4-5, and L5-S1); Lumbar disc herniation (L5-S1); Bulge of cervical disc without myelopathy (C5-6); Chronic neck pain; Cervical spondylosis; and Lumbar spondylosis on her problem list.. Her primarily concern today is the Foot Pain and Back Pain    The patient is currently doing well on her medications however she is having quite a bit of low back pain. She indicates that the problem that she had with her insurance's salt and therefore we can proceed with  the diagnostic bilateral lumbar facet block under fluoroscopic guidance. She still having pain to hyperextension and rotation of the lower back. The patient describes her lower extremity pain to be worse than the low back pain. The lower extremity pain accounts for 70% of her symptoms with the right being worse than the left. She describes those symptoms as a burning type of pain (neuropathic) with the right being just as bad as the left. With rest of the low back pain and this is described to be an axial type low back pain accounting for 30% of her symptoms. In the case of the lower extremity that her right leg pain goes to the bottom of the foot in what seems to be an S1 dermatomal distribution. In the case of the left-sided, it does not go quite as far down. Based on the above I have proposed to the patient to interventional types of therapy. The first one would be a diagnostic bilateral lumbar facet block under fluoroscopic guidance and IV sedation to determine the extent to which her lumbar facet arthropathy is contributing to her symptoms. The second one would be a right-sided lumbar epidural steroid injection at the L4-5 level versus the L5-S1 level to address the issues of her leg pain and her apparent S1 radiculitis. Today's Pain Score: 7  Pain Type: Chronic pain Pain Location: Foot (Patient has developed pain in the last couple of days to mid and upper back. bilaterally) Pain Orientation:  (bilateral) Pain Descriptors / Indicators: Burning, Stabbing, Numbness, Tingling, Aching Pain Frequency: Constant  Date of Last Visit: Date of Last Visit: 12/27/14 Service Provided on Last Visit: Service Provided on Last Visit: Evaluation  Pharmacotherapy Review:   Side-effects or Adverse reactions: None reported. Effectiveness: Described as relatively effective, allowing for increase in activities of daily living (ADL). Onset of action: Within expected pharmacological parameters. Duration of action:  Within normal limits for medication. Peak effect: Timing and results are as within normal expected parameters.  PMP: Compliant with practice rules and regulations. DST: Compliant with practice rules and regulations. Lab work: No new labs ordered by our practice. Treatment compliance: Compliant. Substance Use Disorder (SUD) Risk Level: Low Planned course of action: Continue therapy as is.  Allergies: Denise Webb is allergic to erythromycin stearate and morphine and related.  Meds: The patient has a current medication list which includes the following prescription(s): alprazolam, aspirin-acetaminophen-caffeine, atenolol, atorvastatin, calcium acetate (phos binder), citalopram, glucose blood, hydrochlorothiazide, acidophilus probiotic, loperamide, metformin, neomycin-bacitracin-polymyxin, omeprazole, oxycodone hcl, promethazine, triamcinolone, gabapentin, oxycodone hcl, and oxycodone hcl. Requested Prescriptions   Signed Prescriptions Disp Refills  . Oxycodone HCl 20 MG TABS 90 tablet 0    Sig: Take 1 tablet (20 mg total) by mouth every 8 (eight) hours as needed.  . Oxycodone HCl 20 MG TABS 90 tablet 0    Sig: Take 1 tablet (20 mg total) by mouth every 8 (eight) hours as needed.  . gabapentin (NEURONTIN) 600 MG tablet 120 tablet 2    Sig: Take 1 tablet (600 mg total) by mouth every 8 (eight) hours. Patient instructed to go to QID, as tolerated.  . Oxycodone HCl 20 MG TABS 90 tablet 0    Sig: Take one tablet by mouth three times daily as needed for pain    ROS: Constitutional: Afebrile, no chills, well hydrated and well nourished Gastrointestinal: negative Musculoskeletal:negative Neurological: negative Behavioral/Psych: negative  PFSH: Medical:  Denise Webb  has a past medical history of Hypertension; Colitis; Plantar fasciitis; Anxiety; Depression; Neuropathy ([place]); Hyperlipidemia; TMJ (dislocation of temporomandibular joint); IBS (irritable bowel syndrome)  (2011-2014); GERD (gastroesophageal reflux disease); Internal hemorrhoids; Trichomoniasis; and History of head injury (06/25/2015). Family: family history includes Anxiety disorder in her brother; Cancer in her brother; Cancer (age of onset: 63) in her brother; Colitis in her brother; Heart attack in her father, mother, and other; Heart disease in her brother and mother. Surgical:  has past surgical history that includes Tubal ligation; Cesarean section (1988); Pelvic exenteration (1990); and tubes tied (2004/2005). Tobacco:  reports that she has been smoking Cigarettes.  She has been smoking about 1.00 pack per day. She has never used smokeless tobacco. Alcohol:  reports that she drinks about 0.6 oz of alcohol per week. Drug:  reports that she does not use illicit drugs.  Physical Exam: Vitals:  Today's Vitals   06/24/15 0948 06/24/15 0951  BP: 145/101   Pulse: 76   Temp: 98 F (36.7 C)   Resp: 18   Height: 5\' 8"  (1.727 m)   Weight: 165 lb (74.844 kg)   SpO2: 100%   PainSc: 7  7   PainLoc: Foot   Calculated BMI: Body mass index is 25.09 kg/(m^2). General appearance: alert, cooperative, appears stated age, mild distress and mildly obese Eyes: conjunctivae/corneas clear. PERRL, EOM's intact. Fundi benign. Lungs: No evidence respiratory distress, no audible rales or ronchi and no use of accessory muscles of respiration Neck: no adenopathy, no carotid bruit, no JVD, supple, symmetrical, trachea midline and thyroid not enlarged, symmetric, no tenderness/mass/nodules Back: symmetric, no curvature. ROM normal. No CVA tenderness. Extremities: extremities normal, atraumatic, no cyanosis or edema Pulses: 2+ and symmetric Skin: Skin color, texture, turgor normal. No rashes or lesions Neurologic: Grossly normal    Assessment: Encounter Diagnosis:  Primary Diagnosis: Chronic pain [G89.29]  Plan: Denise Webb was seen today for foot pain and back pain.  Diagnoses and all orders  for this  visit:  Chronic pain -     Discontinue: Oxycodone HCl 20 MG TABS; Take one tablet by mouth three times daily as needed for pain -     Oxycodone HCl 20 MG TABS; Take 1 tablet (20 mg total) by mouth every 8 (eight) hours as needed. -     Oxycodone HCl 20 MG TABS; Take 1 tablet (20 mg total) by mouth every 8 (eight) hours as needed. -     Oxycodone HCl 20 MG TABS; Take one tablet by mouth three times daily as needed for pain  Long term current use of opiate analgesic  Long term prescription opiate use -     Drugs of abuse screen w/o alc, rtn urine-sln; Future  Opiate use  Encounter for therapeutic drug level monitoring  Chronic low back pain  Lumbar facet syndrome, bilateral -     LUMBAR FACET(MEDIAL BRANCH NERVE BLOCK) MBNB; Future  Diabetic peripheral neuropathy associated with type 2 diabetes mellitus (HCC) -     Discontinue: gabapentin (NEURONTIN) 600 MG tablet; Take 1 tablet (600 mg total) by mouth every 8 (eight) hours. -     Discontinue: gabapentin (NEURONTIN) 600 MG tablet; Take 1 tablet (600 mg total) by mouth every 8 (eight) hours. Patient instructed to go to QID, as tolerated. -     gabapentin (NEURONTIN) 600 MG tablet; Take 1 tablet (600 mg total) by mouth every 8 (eight) hours. Patient instructed to go to QID, as tolerated.  Diabetic peripheral neuropathy (HCC)  Uncomplicated opioid dependence ([place])  History of migraine  History of head injury  Sleep apnea  Gastroesophageal reflux disease without esophagitis  Hiatal hernia  Neuropathic pain of both legs  Lumbar facet arthropathy (L2-3, L3-4, L4-5, and L5-S1)  Lumbar disc herniation (L5-S1)  Bulge of cervical disc without myelopathy (C5-6)  Chronic neck pain  Cervical spondylosis  Other osteoarthritis of spine, lumbar region     Patient Instructions   Steps to Quit Smoking  Smoking tobacco can be harmful to your health and can affect almost every organ in your body. Smoking puts you, and those  around you, at risk for developing many serious chronic diseases. Quitting smoking is difficult, but it is one of the best things that you can do for your health. It is never too late to quit. WHAT ARE THE BENEFITS OF QUITTING SMOKING? When you quit smoking, you lower your risk of developing serious diseases and conditions, such as: 1. Lung cancer or lung disease, such as COPD. 2. Heart disease. 3. Stroke. 4. Heart attack. 5. Infertility. 6. Osteoporosis and bone fractures. Additionally, symptoms such as coughing, wheezing, and shortness of breath may get better when you quit. You may also find that you get sick less often because your body is stronger at fighting off colds and infections. If you are pregnant, quitting smoking can help to reduce your chances of having a baby of low birth weight. HOW DO I GET READY TO QUIT? When you decide to quit smoking, create a plan to make sure that you are successful. Before you quit:  Pick a date to quit. Set a date within the next two weeks to give you time to prepare.  Write down the reasons why you are quitting. Keep this list in places where you will see it often, such as on your bathroom mirror or in your car or wallet.  Identify the people, places, things, and activities that make you want to smoke (triggers) and avoid them.  Make sure to take these actions:  Throw away all cigarettes at home, at work, and in your car.  Throw away smoking accessories, such as Scientist, research (medical).  Clean your car and make sure to empty the ashtray.  Clean your home, including curtains and carpets.  Tell your family, friends, and coworkers that you are quitting. Support from your loved ones can make quitting easier.  Talk with your health care provider about your options for quitting smoking.  Find out what treatment options are covered by your health insurance. WHAT STRATEGIES CAN I USE TO QUIT SMOKING?  Talk with your healthcare provider about different  strategies to quit smoking. Some strategies include: 1. Quitting smoking altogether instead of gradually lessening how much you smoke over a period of time. Research shows that quitting "cold [place]" is more successful than gradually quitting. 2. Attending in-person counseling to help you build problem-solving skills. You are more likely to have success in quitting if you attend several counseling sessions. Even short sessions of 10 minutes can be effective. 3. Finding resources and support systems that can help you to quit smoking and remain smoke-free after you quit. These resources are most helpful when you use them often. They can include: 1. Online chats with a Social worker. 2. Telephone quitlines. 3. Careers information officer. 4. Support groups or group counseling. 5. Text messaging programs. 6. Mobile phone applications. 4. Taking medicines to help you quit smoking. (If you are pregnant or breastfeeding, talk with your health care provider first.) Some medicines contain nicotine and some do not. Both types of medicines help with cravings, but the medicines that include nicotine help to relieve withdrawal symptoms. Your health care provider may recommend: 1. Nicotine patches, gum, or lozenges. 2. Nicotine inhalers or sprays. 3. Non-nicotine medicine that is taken by mouth. Talk with your health care provider about combining strategies, such as taking medicines while you are also receiving in-person counseling. Using these two strategies together makes you more likely to succeed in quitting than if you used either strategy on its own. If you are pregnant or breastfeeding, talk with your health care provider about finding counseling or other support strategies to quit smoking. Do not take medicine to help you quit smoking unless told to do so by your health care provider. WHAT THINGS CAN I DO TO MAKE IT EASIER TO QUIT? Quitting smoking might feel overwhelming at first, but there is a lot that you  can do to make it easier. Take these important actions:  Reach out to your family and friends and ask that they support and encourage you during this time. Call telephone quitlines, reach out to support groups, or work with a counselor for support.  Ask people who smoke to avoid smoking around you.  Avoid places that trigger you to smoke, such as bars, parties, or smoke-break areas at work.  Spend time around people who do not smoke.  Lessen stress in your life, because stress can be a smoking trigger for some people. To lessen stress, try:  Exercising regularly.  Deep-breathing exercises.  Yoga.  Meditating.  Performing a body scan. This involves closing your eyes, scanning your body from head to toe, and noticing which parts of your body are particularly tense. Purposefully relax the muscles in those areas.  Download or purchase mobile phone or tablet apps (applications) that can help you stick to your quit plan by providing reminders, tips, and encouragement. There are many free apps, such as QuitGuide from the CDC (  Centers for Disease Control and Prevention). You can find other support for quitting smoking (smoking cessation) through smokefree.gov and other websites. HOW WILL I FEEL WHEN I QUIT SMOKING? Within the first 24 hours of quitting smoking, you may start to feel some withdrawal symptoms. These symptoms are usually most noticeable 2-3 days after quitting, but they usually do not last beyond 2-3 weeks. Changes or symptoms that you might experience include: 1. Mood swings. 2. Restlessness, anxiety, or irritation. 3. Difficulty concentrating. 4. Dizziness. 5. Strong cravings for sugary foods in addition to nicotine. 6. Mild weight gain. 7. Constipation. 8. Nausea. 9. Coughing or a sore throat. 10. Changes in how your medicines work in your body. 11. A depressed mood. 12. Difficulty sleeping (insomnia). After the first 2-3 weeks of quitting, you may start to notice more  positive results, such as: 1. Improved sense of smell and taste. 2. Decreased coughing and sore throat. 3. Slower heart rate. 4. Lower blood pressure. 5. Clearer skin. 6. The ability to breathe more easily. 7. Fewer sick days. Quitting smoking is very challenging for most people. Do not get discouraged if you are not successful the first time. Some people need to make many attempts to quit before they achieve long-term success. Do your best to stick to your quit plan, and talk with your health care provider if you have any questions or concerns.   This information is not intended to replace advice given to you by your health care provider. Make sure you discuss any questions you have with your health care provider.   Document Released: 08/04/2001 Document Revised: 12/25/2014 Document Reviewed: 12/25/2014 Elsevier Interactive Patient Education 2016 [place]  What are the risk, side effects and possible complications? Generally speaking, most procedures are safe.  However, with any procedure there are risks, side effects, and the possibility of complications.  The risks and complications are dependent upon the sites that are lesioned, or the type of nerve block to be performed.  The closer the procedure is to the spine, the more serious the risks are.  Great care is taken when placing the radio frequency needles, block needles or lesioning probes, but sometimes complications can occur. 1. Infection: Any time there is an injection through the skin, there is a risk of infection.  This is why sterile conditions are used for these blocks.  There are four possible types of infection. 1. Localized skin infection. 2. Central Nervous System Infection-This can be in the form of Meningitis, which can be deadly. 3. Epidural Infections-This can be in the form of an epidural abscess, which can cause pressure inside of the spine, causing compression of the spinal cord with  subsequent paralysis. This would require an emergency surgery to decompress, and there are no guarantees that the patient would recover from the paralysis. 4. Discitis-This is an infection of the intervertebral discs.  It occurs in about 1% of discography procedures.  It is difficult to treat and it may lead to surgery.        2. Pain: the needles have to go through skin and soft tissues, will cause soreness.       3. Damage to internal structures:  The nerves to be lesioned may be near blood vessels or    other nerves which can be potentially damaged.       4. Bleeding: Bleeding is more common if the patient is taking blood thinners such as  aspirin, Coumadin, Ticiid, Plavix, etc., or if  he/she have some genetic predisposition  such as hemophilia. Bleeding into the spinal canal can cause compression of the spinal  cord with subsequent paralysis.  This would require an emergency surgery to  decompress and there are no guarantees that the patient would recover from the  paralysis.       5. Pneumothorax:  Puncturing of a lung is a possibility, every time a needle is introduced in  the area of the chest or upper back.  Pneumothorax refers to free air around the  collapsed lung(s), inside of the thoracic cavity (chest cavity).  Another two possible  complications related to a similar event would include: Hemothorax and Chylothorax.   These are variations of the Pneumothorax, where instead of air around the collapsed  lung(s), you may have blood or chyle, respectively.       6. Spinal headaches: They may occur with any procedures in the area of the spine.       7. Persistent CSF (Cerebro-Spinal Fluid) leakage: This is a rare problem, but may occur  with prolonged intrathecal or epidural catheters either due to the formation of a fistulous  track or a dural tear.       8. Nerve damage: By working so close to the spinal cord, there is always a possibility of  nerve damage, which could be as serious as a permanent  spinal cord injury with  paralysis.       9. Death:  Although rare, severe deadly allergic reactions known as "Anaphylactic  reaction" can occur to any of the medications used.      10. Worsening of the symptoms:  We can always make thing worse.  What are the chances of something like this happening? Chances of any of this occuring are extremely low.  By statistics, you have more of a chance of getting killed in a motor vehicle accident: while driving to the hospital than any of the above occurring .  Nevertheless, you should be aware that they are possibilities.  In general, it is similar to taking a shower.  Everybody knows that you can slip, hit your head and get killed.  Does that mean that you should not shower again?  Nevertheless always keep in mind that statistics do not mean anything if you happen to be on the wrong side of them.  Even if a procedure has a 1 (one) in a 1,000,000 (million) chance of going wrong, it you happen to be that one..Also, keep in mind that by statistics, you have more of a chance of having something go wrong when taking medications.  Who should not have this procedure? If you are on a blood thinning medication (e.g. Coumadin, Plavix, see list of "Blood Thinners"), or if you have an active infection going on, you should not have the procedure.  If you are taking any blood thinners, please inform your physician.  How should I prepare for this procedure?  Do not eat or drink anything at least six hours prior to the procedure.  Bring a driver with you .  It cannot be a taxi.  Come accompanied by an adult that can drive you back, and that is strong enough to help you if your legs get weak or numb from the local anesthetic.  Take all of your medicines the morning of the procedure with just enough water to swallow them.  If you have diabetes, make sure that you are scheduled to have your procedure done first thing in the morning,  whenever possible.  If you have  diabetes, take only half of your insulin dose and notify our nurse that you have done so as soon as you arrive at the clinic.  If you are diabetic, but only take blood sugar pills (oral hypoglycemic), then do not take them on the morning of your procedure.  You may take them after you have had the procedure.  Do not take aspirin or any aspirin-containing medications, at least eleven (11) days prior to the procedure.  They may prolong bleeding.  Wear loose fitting clothing that may be easy to take off and that you would not mind if it got stained with Betadine or blood.  Do not wear any jewelry or perfume  Remove any nail coloring.  It will interfere with some of our monitoring equipment.  NOTE: Remember that this is not meant to be interpreted as a complete list of all possible complications.  Unforeseen problems may occur.  BLOOD THINNERS The following drugs contain aspirin or other products, which can cause increased bleeding during surgery and should not be taken for 2 weeks prior to and 1 week after surgery.  If you should need take something for relief of minor pain, you may take acetaminophen which is found in Tylenol,m Datril, Anacin-3 and Panadol. It is not blood thinner. The products listed below are.  Do not take any of the products listed below in addition to any listed on your instruction sheet.  A.P.C or A.P.C with Codeine Codeine Phosphate Capsules #3 Ibuprofen Ridaura  ABC compound Congesprin Imuran rimadil  Advil Cope Indocin Robaxisal  Alka-Seltzer Effervescent Pain Reliever and Antacid Coricidin or Coricidin-D  Indomethacin Rufen  Alka-Seltzer plus Cold Medicine Cosprin Ketoprofen S-A-C Tablets  Anacin Analgesic Tablets or Capsules Coumadin Korlgesic Salflex  Anacin Extra Strength Analgesic tablets or capsules CP-2 Tablets Lanoril Salicylate  Anaprox Cuprimine Capsules Levenox Salocol  Anexsia-D Dalteparin Magan Salsalate  Anodynos Darvon compound Magnesium Salicylate  Sine-off  Ansaid Dasin Capsules Magsal Sodium Salicylate  Anturane Depen Capsules Marnal Soma  APF Arthritis pain formula Dewitt's Pills Measurin Stanback  Argesic Dia-Gesic Meclofenamic Sulfinpyrazone  Arthritis Bayer Timed Release Aspirin Diclofenac Meclomen Sulindac  Arthritis pain formula Anacin Dicumarol Medipren Supac  Analgesic (Safety coated) Arthralgen Diffunasal Mefanamic Suprofen  Arthritis Strength Bufferin Dihydrocodeine Mepro Compound Suprol  Arthropan liquid Dopirydamole Methcarbomol with Aspirin Synalgos  ASA tablets/Enseals Disalcid Micrainin Tagament  Ascriptin Doan's Midol Talwin  Ascriptin A/D Dolene Mobidin Tanderil  Ascriptin Extra Strength Dolobid Moblgesic Ticlid  Ascriptin with Codeine Doloprin or Doloprin with Codeine Momentum Tolectin  Asperbuf Duoprin Mono-gesic Trendar  Aspergum Duradyne Motrin or Motrin IB Triminicin  Aspirin plain, buffered or enteric coated Durasal Myochrisine Trigesic  Aspirin Suppositories Easprin Nalfon Trillsate  Aspirin with Codeine Ecotrin Regular or Extra Strength Naprosyn Uracel  Atromid-S Efficin Naproxen Ursinus  Auranofin Capsules Elmiron Neocylate Vanquish  Axotal Emagrin Norgesic Verin  Azathioprine Empirin or Empirin with Codeine Normiflo Vitamin E  Azolid Emprazil Nuprin Voltaren  Bayer Aspirin plain, buffered or children's or timed BC Tablets or powders Encaprin Orgaran Warfarin Sodium  Buff-a-Comp Enoxaparin Orudis Zorpin  Buff-a-Comp with Codeine Equegesic Os-Cal-Gesic   Buffaprin Excedrin plain, buffered or Extra Strength Oxalid   Bufferin Arthritis Strength Feldene Oxphenbutazone   Bufferin plain or Extra Strength Feldene Capsules Oxycodone with Aspirin   Bufferin with Codeine Fenoprofen Fenoprofen Pabalate or Pabalate-SF   Buffets II Flogesic Panagesic   Buffinol plain or Extra Strength Florinal or Florinal with Codeine Panwarfarin   Buf-Tabs Flurbiprofen Penicillamine  Butalbital Compound Four-way cold tablets  Penicillin   Butazolidin Fragmin Pepto-Bismol   Carbenicillin Geminisyn Percodan   Carna Arthritis Reliever Geopen Persantine   Carprofen Gold's salt Persistin   Chloramphenicol Goody's Phenylbutazone   Chloromycetin Haltrain Piroxlcam   Clmetidine heparin Plaquenil   Cllnoril Hyco-pap Ponstel   Clofibrate Hydroxy chloroquine Propoxyphen         Before stopping any of these medications, be sure to consult the physician who ordered them.  Some, such as Coumadin (Warfarin) are ordered to prevent or treat serious conditions such as "deep thrombosis", "pumonary embolisms", and other heart problems.  The amount of time that you may need off of the medication may also vary with the medication and the reason for which you were taking it.  If you are taking any of these medications, please make sure you notify your pain physician before you undergo any procedures.         Facet Blocks Patient Information  Description: The facets are joints in the spine between the vertebrae.  Like any joints in the body, facets can become irritated and painful.  Arthritis can also effect the facets.  By injecting steroids and local anesthetic in and around these joints, we can temporarily block the nerve supply to them.  Steroids act directly on irritated nerves and tissues to reduce selling and inflammation which often leads to decreased pain.  Facet blocks may be done anywhere along the spine from the neck to the low back depending upon the location of your pain.   After numbing the skin with local anesthetic (like Novocaine), a small needle is passed onto the facet joints under x-ray guidance.  You may experience a sensation of pressure while this is being done.  The entire block usually lasts about 15-25 minutes.   Conditions which may be treated by facet blocks:   Low back/buttock pain  Neck/shoulder pain  Certain types of headaches  Preparation for the injection:  5. Do not eat any solid food or  dairy products within 6 hours of your appointment. 6. You may drink clear liquid up to 2 hours before appointment.  Clear liquids include water, black coffee, juice or soda.  No milk or cream please. 7. You may take your regular medication, including pain medications, with a sip of water before your appointment.  Diabetics should hold regular insulin (if taken separately) and take 1/2 normal NPH dose the morning of the procedure.  Carry some sugar containing items with you to your appointment. 8. A driver must accompany you and be prepared to drive you home after your procedure. 9. Bring all your current medications with you. 10. An IV may be inserted and sedation may be given at the discretion of the physician. 11. A blood pressure cuff, EKG and other monitors will often be applied during the procedure.  Some patients may need to have extra oxygen administered for a short period. 12. You will be asked to provide medical information, including your allergies and medications, prior to the procedure.  We must know immediately if you are taking blood thinners (like Coumadin/Warfarin) or if you are allergic to IV iodine contrast (dye).  We must know if you could possible be pregnant.  Possible side-effects:   Bleeding from needle site  Infection (rare, may require surgery)  Nerve injury (rare)  Numbness & tingling (temporary)  Difficulty urinating (rare, temporary)  Spinal headache (a headache worse with upright posture)  Light-headedness (temporary)  Pain at injection site (serveral days)  Decreased blood pressure (rare, temporary)  Weakness in arm/leg (temporary)  Pressure sensation in back/neck (temporary)   Call if you experience:   Fever/chills associated with headache or increased back/neck pain  Headache worsened by an upright position  New onset, weakness or numbness of an extremity below the injection site  Hives or difficulty breathing (go to the emergency  room)  Inflammation or drainage at the injection site(s)  Severe back/neck pain greater than usual  New symptoms which are concerning to you  Please note:  Although the local anesthetic injected can often make your back or neck feel good for several hours after the injection, the pain will likely return. It takes 3-7 days for steroids to work.  You may not notice any pain relief for at least one week.  If effective, we will often do a series of 2-3 injections spaced 3-6 weeks apart to maximally decrease your pain.  After the initial series, you may be a candidate for a more permanent nerve block of the facets.  If you have any questions, please call #[phone]) [facility]   Medications discontinued today:  Medications Discontinued During This Encounter  Medication Reason  . oxyCODONE (OXY IR/ROXICODONE) 5 MG immediate release tablet   . Oxycodone HCl 20 MG TABS Reorder  . hydrochlorothiazide (HYDRODIURIL) 25 MG tablet Error  . gabapentin (NEURONTIN) 400 MG capsule   . gabapentin (NEURONTIN) 600 MG tablet Reorder  . gabapentin (NEURONTIN) 600 MG tablet Reorder  . Oxycodone HCl 20 MG TABS Reorder   Medications administered today:  Denise Webb had no medications administered during this visit.  Primary Care Physician: Gildardo Cranker, DO Location: [facility] Facility Note by: Beatriz Chancellor A. Dossie Arbour, M.D, DABA, DABAPM, DABPM, DABIPP, FIPP

## 2015-06-24 NOTE — Progress Notes (Signed)
Safety precautions to be maintained throughout the outpatient stay will include: orient to surroundings, keep bed in low position, maintain call bell within reach at all times, provide assistance with transfer out of bed and ambulation. Did not bring pain meds to count.  Reminded to bring to all appointments

## 2015-06-25 ENCOUNTER — Encounter: Payer: Self-pay | Admitting: Pain Medicine

## 2015-06-25 ENCOUNTER — Other Ambulatory Visit: Payer: Self-pay | Admitting: *Deleted

## 2015-06-25 DIAGNOSIS — G4730 Sleep apnea, unspecified: Secondary | ICD-10-CM | POA: Insufficient documentation

## 2015-06-25 DIAGNOSIS — K219 Gastro-esophageal reflux disease without esophagitis: Secondary | ICD-10-CM | POA: Insufficient documentation

## 2015-06-25 DIAGNOSIS — M5030 Other cervical disc degeneration, unspecified cervical region: Secondary | ICD-10-CM | POA: Insufficient documentation

## 2015-06-25 DIAGNOSIS — F411 Generalized anxiety disorder: Secondary | ICD-10-CM

## 2015-06-25 DIAGNOSIS — M47816 Spondylosis without myelopathy or radiculopathy, lumbar region: Secondary | ICD-10-CM | POA: Insufficient documentation

## 2015-06-25 DIAGNOSIS — M542 Cervicalgia: Secondary | ICD-10-CM

## 2015-06-25 DIAGNOSIS — M47812 Spondylosis without myelopathy or radiculopathy, cervical region: Secondary | ICD-10-CM | POA: Insufficient documentation

## 2015-06-25 DIAGNOSIS — M5020 Other cervical disc displacement, unspecified cervical region: Secondary | ICD-10-CM | POA: Insufficient documentation

## 2015-06-25 DIAGNOSIS — F1120 Opioid dependence, uncomplicated: Secondary | ICD-10-CM | POA: Insufficient documentation

## 2015-06-25 DIAGNOSIS — M5126 Other intervertebral disc displacement, lumbar region: Secondary | ICD-10-CM | POA: Insufficient documentation

## 2015-06-25 DIAGNOSIS — E1142 Type 2 diabetes mellitus with diabetic polyneuropathy: Secondary | ICD-10-CM | POA: Insufficient documentation

## 2015-06-25 DIAGNOSIS — Z8669 Personal history of other diseases of the nervous system and sense organs: Secondary | ICD-10-CM | POA: Insufficient documentation

## 2015-06-25 DIAGNOSIS — G8929 Other chronic pain: Secondary | ICD-10-CM | POA: Insufficient documentation

## 2015-06-25 DIAGNOSIS — Z87828 Personal history of other (healed) physical injury and trauma: Secondary | ICD-10-CM

## 2015-06-25 DIAGNOSIS — K449 Diaphragmatic hernia without obstruction or gangrene: Secondary | ICD-10-CM | POA: Insufficient documentation

## 2015-06-25 HISTORY — DX: Personal history of other (healed) physical injury and trauma: Z87.828

## 2015-06-25 MED ORDER — ALPRAZOLAM 1 MG PO TABS: ORAL_TABLET | ORAL | Status: DC

## 2015-06-25 NOTE — Telephone Encounter (Signed)
Patient requested and faxed to pharmacy 

## 2015-07-01 LAB — TOXASSURE SELECT 13 (MW), URINE: PDF: 0

## 2015-07-08 NOTE — Progress Notes (Signed)
Quick Note:  The results on our prescribed medications were as expected and in compliance of therapy, however, an unreported benzodiazepine was identified. Based of CDC guidelines, this increases the patient's risk of drug-to-drug interaction with possible resultant respiratory failure and death. It is imperative that all medications be reported to the nursing staff at the time the urine sample is provided. ______

## 2015-07-09 ENCOUNTER — Ambulatory Visit: Payer: 59 | Attending: Pain Medicine | Admitting: Pain Medicine

## 2015-07-09 ENCOUNTER — Encounter: Payer: Self-pay | Admitting: Pain Medicine

## 2015-07-09 VITALS — BP 100/69 | HR 67 | Temp 98.0°F | Resp 16 | Ht 68.5 in | Wt 165.0 lb

## 2015-07-09 DIAGNOSIS — K589 Irritable bowel syndrome without diarrhea: Secondary | ICD-10-CM | POA: Insufficient documentation

## 2015-07-09 DIAGNOSIS — M47812 Spondylosis without myelopathy or radiculopathy, cervical region: Secondary | ICD-10-CM

## 2015-07-09 DIAGNOSIS — F329 Major depressive disorder, single episode, unspecified: Secondary | ICD-10-CM | POA: Insufficient documentation

## 2015-07-09 DIAGNOSIS — I10 Essential (primary) hypertension: Secondary | ICD-10-CM | POA: Diagnosis not present

## 2015-07-09 DIAGNOSIS — E1140 Type 2 diabetes mellitus with diabetic neuropathy, unspecified: Secondary | ICD-10-CM | POA: Insufficient documentation

## 2015-07-09 DIAGNOSIS — F419 Anxiety disorder, unspecified: Secondary | ICD-10-CM | POA: Insufficient documentation

## 2015-07-09 DIAGNOSIS — M5416 Radiculopathy, lumbar region: Secondary | ICD-10-CM

## 2015-07-09 DIAGNOSIS — M47817 Spondylosis without myelopathy or radiculopathy, lumbosacral region: Secondary | ICD-10-CM | POA: Insufficient documentation

## 2015-07-09 DIAGNOSIS — M4726 Other spondylosis with radiculopathy, lumbar region: Secondary | ICD-10-CM | POA: Diagnosis not present

## 2015-07-09 DIAGNOSIS — E781 Pure hyperglyceridemia: Secondary | ICD-10-CM | POA: Insufficient documentation

## 2015-07-09 DIAGNOSIS — R200 Anesthesia of skin: Secondary | ICD-10-CM | POA: Insufficient documentation

## 2015-07-09 DIAGNOSIS — M5126 Other intervertebral disc displacement, lumbar region: Secondary | ICD-10-CM

## 2015-07-09 DIAGNOSIS — F17200 Nicotine dependence, unspecified, uncomplicated: Secondary | ICD-10-CM | POA: Insufficient documentation

## 2015-07-09 DIAGNOSIS — G43909 Migraine, unspecified, not intractable, without status migrainosus: Secondary | ICD-10-CM | POA: Diagnosis not present

## 2015-07-09 DIAGNOSIS — M545 Low back pain, unspecified: Secondary | ICD-10-CM

## 2015-07-09 DIAGNOSIS — F17290 Nicotine dependence, other tobacco product, uncomplicated: Secondary | ICD-10-CM

## 2015-07-09 DIAGNOSIS — M792 Neuralgia and neuritis, unspecified: Secondary | ICD-10-CM

## 2015-07-09 DIAGNOSIS — M47816 Spondylosis without myelopathy or radiculopathy, lumbar region: Secondary | ICD-10-CM

## 2015-07-09 DIAGNOSIS — G8929 Other chronic pain: Secondary | ICD-10-CM

## 2015-07-09 DIAGNOSIS — R52 Pain, unspecified: Secondary | ICD-10-CM | POA: Diagnosis present

## 2015-07-09 MED ORDER — SODIUM CHLORIDE 0.9 % IJ SOLN
INTRAMUSCULAR | Status: AC
  Administered 2015-07-09: 2 mL
  Filled 2015-07-09: qty 10

## 2015-07-09 MED ORDER — FENTANYL CITRATE (PF) 100 MCG/2ML IJ SOLN
INTRAMUSCULAR | Status: AC
  Administered 2015-07-09: 50 ug via INTRAVENOUS
  Filled 2015-07-09: qty 2

## 2015-07-09 MED ORDER — IOHEXOL 240 MG/ML SOLN
INTRAMUSCULAR | Status: AC
  Administered 2015-07-09: 1 mL
  Filled 2015-07-09: qty 100

## 2015-07-09 MED ORDER — IOHEXOL 180 MG/ML  SOLN
10.0000 mL | Freq: Once | INTRAMUSCULAR | Status: DC | PRN
Start: 2015-07-09 — End: 2015-09-19

## 2015-07-09 MED ORDER — MIDAZOLAM HCL 5 MG/5ML IJ SOLN
5.0000 mg | Freq: Once | INTRAMUSCULAR | Status: DC
Start: 2015-07-09 — End: 2015-09-19

## 2015-07-09 MED ORDER — MIDAZOLAM HCL 5 MG/5ML IJ SOLN
INTRAMUSCULAR | Status: AC
  Administered 2015-07-09: 1 mg via INTRAVENOUS
  Filled 2015-07-09: qty 5

## 2015-07-09 MED ORDER — ROPIVACAINE HCL 2 MG/ML IJ SOLN
INTRAMUSCULAR | Status: AC
  Administered 2015-07-09: 2 mL
  Filled 2015-07-09: qty 10

## 2015-07-09 MED ORDER — ROPIVACAINE HCL 2 MG/ML IJ SOLN: 9.0000 mL | Freq: Once | INTRAMUSCULAR | Status: DC

## 2015-07-09 MED ORDER — FENTANYL CITRATE (PF) 100 MCG/2ML IJ SOLN: 100.0000 ug | Freq: Once | INTRAMUSCULAR | Status: DC

## 2015-07-09 MED ORDER — SODIUM CHLORIDE 0.9 % IJ SOLN: 2.0000 mL | Freq: Once | INTRAMUSCULAR | Status: DC

## 2015-07-09 MED ORDER — LIDOCAINE HCL (PF) 1 % IJ SOLN
INTRAMUSCULAR | Status: AC
  Administered 2015-07-09: 10 mL
  Filled 2015-07-09: qty 10

## 2015-07-09 MED ORDER — TRIAMCINOLONE ACETONIDE 40 MG/ML IJ SUSP: 40.0000 mg | Freq: Once | INTRAMUSCULAR | Status: DC

## 2015-07-09 MED ORDER — LACTATED RINGERS IV SOLN: 1000.0000 mL | INTRAVENOUS | Status: AC

## 2015-07-09 MED ORDER — LIDOCAINE HCL (PF) 1 % IJ SOLN: 10.0000 mL | Freq: Once | INTRAMUSCULAR | Status: DC

## 2015-07-09 MED ORDER — TRIAMCINOLONE ACETONIDE 40 MG/ML IJ SUSP
INTRAMUSCULAR | Status: AC
  Administered 2015-07-09: 40 mg
  Filled 2015-07-09: qty 1

## 2015-07-09 MED ORDER — ROPIVACAINE HCL 2 MG/ML IJ SOLN: 2.0000 mL | Freq: Once | INTRAMUSCULAR | Status: DC

## 2015-07-09 NOTE — Progress Notes (Signed)
Patient's Name: Denise Webb MRN: [id number] DOB: 27-Oct-1965 DOS: 07/09/2015  Primary Reason(s) for Visit: Interventional Pain Management Treatment. CC: Pain   Pre-Procedure Assessment: Denise Webb is a 49 y.o. year old, female patient, seen today for interventional treatment. She has HYPERTRIGLYCERIDEMIA; ANXIETY DEPRESSION; Tobacco abuse; Essential hypertension, benign; Irritable bowel syndrome; Abdominal pain, unspecified site; Leg pain, bilateral; Neuropathic pain; DM type 2, uncontrolled, with neuropathy ([place]); Other and unspecified hyperlipidemia; Polyneuropathic pain; Encounter for therapeutic drug monitoring; Neuropathic pain of both legs; Blister of foot without infection; Depression; Ingrown toenail; Hyperlipidemia LDL goal <100; Insomnia; Abnormality of gait; Paresthesia; Lumbar radicular pain (Bilateral) (R>L) (Right L5 Dermatome); Generalized anxiety disorder; Multiple joint pain; 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[place]); Chronic pain; Long term current use of opiate analgesic; Long term prescription opiate use; Opiate use; Encounter for therapeutic drug level monitoring; Chronic low back pain; Lumbar facet syndrome, bilateral; Diabetic peripheral neuropathy associated with type 2 diabetes mellitus ([place]); Diabetic peripheral neuropathy ([place]); Opiate dependence ([place]); History of migraine; History of head injury; Sleep apnea; GERD (gastroesophageal reflux disease); Hiatal hernia; Lumbar facet arthropathy (L2-3, L3-4, L4-5, and L5-S1); Lumbar disc herniation (L5-S1); Bulge of cervical disc without myelopathy (C5-6); Chronic neck pain; Cervical spondylosis; Lumbar spondylosis; and Nicotine dependence on her problem list.. Her primarily concern today is the Pain Verification of the correct person, correct site (including marking of site), and correct procedure were performed and confirmed by the patient.  Although I do have the patient initially scheduled for a  diagnostic bilateral lumbar facet block, and today she comes in indicating that her pain is primarily in the lower extremities with the right being worst on the left. This pain goes to the top of the foot in what seems to be an L5 dermatomal distribution. She has some pain combined with numbness in both feet, but this is secondary to her diabetic peripheral neuropathy. Upon the patient's return to the clinic, we need to reevaluate her medication management, since she does seem to be a little sleepy today. Today's Vitals   07/09/15 1020 07/09/15 1028 07/09/15 1037 07/09/15 1049  BP: 105/58 108/59 100/78 100/69  Pulse: 78 74 65 67  Temp:      TempSrc:      Resp: _0 Height:      Weight:      SpO2: 96% 100% 97% 97%  PainSc: 3  0-No pain    PainLoc:      Calculated BMI: Body mass index is 24.72 kg/(m^2). Allergies: She is allergic to erythromycin stearate and morphine and related.. 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 Laterality: Right-Sided Paramedial  Indications: Spondylosis, Lumbosacral Region  Consent: Secured. Under the influence of no sedatives a written informed consent was obtained, after having provided information on the risks and possible complications. To fulfill our ethical and legal obligations, as recommended by the American Medical Association's Code of Ethics, we have provided information to the patient about our clinical impression; the nature and purpose of the treatment or procedure; the risks, benefits, and possible complications of the intervention; alternatives; the risk(s) and benefit(s) of the alternative treatment(s) or procedure(s); and the risk(s) and benefit(s) of doing nothing. The patient was provided information about the risks and possible complications associated with the procedure. In the case of  spinal procedures these may include, but are not limited to, failure to achieve desired goals, infection, bleeding,  organ or nerve damage, allergic reactions, paralysis, and death. In addition, the patient was informed that Medicine is not an exact science; therefore, there is also the possibility of unforeseen risks and possible complications that may result in a catastrophic outcome. The patient indicated having understood very clearly. We have given the patient no guarantees and we have made no promises. Enough time was given to the patient to ask questions, all of which were answered to the patient's satisfaction.  Pre-Procedure Preparation: Safety Precautions: Allergies reviewed. Appropriate site, procedure, and patient were confirmed by following the Joint Commission's Universal Protocol (UP.01.01.01), in the form of a "Time Out". The patient was asked to confirm marked site and procedure, before commencing. The patient was asked about blood thinners, or active infections, both of which were denied. Patient was assessed for positional comfort and all pressure points were checked before starting procedure. Monitoring:  As per clinic protocol. Infection Control Precautions: Sterile technique used. Standard Universal Precautions were taken as recommended by the Department of [facility] for Disease Control and Prevention (CDC). Standard pre-surgical skin prep was conducted. Respiratory hygiene and cough etiquette was practiced. Hand hygiene observed. Safe injection practices and needle disposal techniques followed. SDV (single dose vial) medications used. Medications properly checked for expiration dates and contaminants. Personal protective equipment (PPE) used: Surgical mask. Sterile double glove technique. Radiation resistant gloves. Sterile surgical gloves.  Anesthesia, Analgesia, Anxiolysis: Type: Moderate (Conscious) Sedation & Local Anesthesia. Meaningful verbal contact was maintained,  with the patient at all times during the procedure. Local Anesthetic(s): Lidocaine 1% Route: Intravenous (IV) IV Access: Secured. Sedation: Meaningful verbal contact was maintained at all times during the procedure. Indication(s):Anxiety  Description of Procedure Process:  Time-out: "Time-out" completed before starting procedure, as per protocol. Position: Prone Target Area: For Epidural Steroid injections, the target area is the  interlaminar space, initially targeting the lower border of the superior vertebral body lamina. Approach: Posterior approach. Area Prepped: Entire Posterior Lumbosacral Region Prepping solution: ChloraPrep (2% chlorhexidine gluconate and 70% isopropyl alcohol) Safety Precautions: Aspiration looking for blood return was conducted prior to all injections. At no point did we inject any substances, as a needle was being advanced. No attempts were made at seeking any paresthesias. Safe injection practices and needle disposal techniques used. Medications properly checked for expiration dates. SDV (single dose vial) medications used. Description of the Procedure: Protocol guidelines were followed. The patient was placed in position over the fluoroscopy table. The target area was identified and the area prepped in the usual manner. Skin desensitized using vapocoolant spray. Skin & deeper tissues infiltrated with local anesthetic. Appropriate amount of time allowed to pass for local anesthetics to take effect. The procedure needle was introduced through the skin, ipsilateral to the reported pain, and advanced to the target area. Bone was contacted and the needle walked caudad, until the lamina was cleared. The epidural space was identified using "loss-of-resistance technique" with 2-3 ml of PF-NaCl (0.9% NSS), in a 5cc LOR glass syringe. Proper needle placement secured. Negative aspiration confirmed. Solution injected in intermittent fashion, asking for systemic symptoms every 0.5cc  of injectate. The needles were then removed and the area cleansed, making sure to leave some of the prepping solution back to take advantage of its long term bactericidal properties. EBL: None Materials & Medications Used:  Needle(s) Used: 20g - 10cm, Tuohy-style epidural needle Medications Administered today: We administered sodium chloride, ropivacaine (PF) 2 mg/ml (0.2%), triamcinolone acetonide, iohexol, fentaNYL, midazolam, lidocaine (PF), and sodium chloride.Please see  chart orders for dosing details.  Imaging Guidance:  Type of Imaging Technique: Fluoroscopy Guidance (Spinal) Indication(s): Assistance in needle guidance and placement for procedures requiring needle placement in or near specific anatomical locations not easily accessible without such assistance. Exposure Time: Please see nurses notes. Contrast: Before injecting any contrast, we confirmed that the patient did not have an allergy to iodine, shellfish, or radiological contrast. Once satisfactory needle placement was completed at the desired level, radiological contrast was injected. Injection was conducted under continuous fluoroscopic guidance. Injection of contrast accomplished without complications. See chart for type and volume of contrast used. Fluoroscopic Guidance: I was personally present in the fluoroscopy suite, where the patient was placed in position for the procedure, over the fluoroscopy-compatible table. Fluoroscopy was manipulated, using "Tunnel Vision Technique", to obtain the best possible view of the target area, on the affected side. Parallax error was corrected before commencing the procedure. A "direction-depth-direction" technique was used to introduce the needle under continuous pulsed fluoroscopic guidance. Once the target was reached, antero-posterior, oblique, and lateral fluoroscopic projection views were taken to confirm needle placement in all planes. Permanently recorded images stored by scanning into  EMR. Interpretation: Intraoperative imaging interpretation by performing Physician. Adequate needle placement confirmed. Adequate needle placement confirmed in AP, lateral, & Oblique Views. Appropriate spread of contrast to desired area. No evidence of afferent or efferent intravascular uptake. No intrathecal or subarachnoid spread observed. Permanent hardcopy images in multiple planes scanned into the patient's record.  Antibiotics:  Type:  Antibiotics Given (last 72 hours)    None      Indication(s): No indications identified.  Post-operative Assessment:  Complications: No immediate post-treatment complications were observed. Relevant Post-operative Information:  Disposition: Return to clinic for follow-up evaluation. The patient tolerated the entire procedure well. A repeat set of vitals were taken after the procedure and the patient was kept under observation following institutional policy, for this procedure. Post-procedural neurological assessment was performed, showing return to baseline, prior to discharge. The patient was discharged home, once institutional criteria were met. The patient was provided with post-procedure discharge instructions, including a section on how to identify potential problems. Should any problems arise concerning this procedure, the patient was given instructions to immediately contact us, at any time, without hesitation. In any case, we plan to contact the patient by telephone for a follow-up status report regarding this interventional procedure. Comments:  No additional relevant information.  Primary Care Physician: Gildardo Cranker, DO Location: [facility] Facility Note by: Beatriz Chancellor A. Dossie Arbour, M.D, DABA, DABAPM, DABPM, DABIPP, FIPP  Disclaimer:  Medicine is not an exact science. The only guarantee in medicine is that nothing is guaranteed. It is important to note that the decision to proceed with this intervention was based on the  information collected from the patient. The Data and conclusions were drawn from the patient's questionnaire, the interview, and the physical examination. Because the information was provided in large part by the patient, it cannot be guaranteed that it has not been purposely or unconsciously manipulated. Every effort has been made to obtain as much relevant data as possible for this evaluation. It is important to note that the conclusions that lead to this procedure are derived in large part from the available data. Always take into account that the treatment will also be dependent on availability of resources and existing treatment guidelines, considered by other Pain Management Practitioners as being common knowledge and practice, at the time of the intervention. For Medico-Legal purposes, it is  also important to point out that variation in procedural techniques and pharmacological choices are the acceptable norm. The indications, contraindications, technique, and results of the above procedure should only be interpreted and judged by a Board-Certified Interventional Pain Specialist with extensive familiarity and expertise in the same exact procedure and technique. Attempts at providing opinions without similar or greater experience and expertise than that of the treating physician will be considered as inappropriate and unethical, and shall result in a formal complaint to the state medical board and applicable specialty societies.

## 2015-07-09 NOTE — Patient Instructions (Signed)
Pain Management Discharge Instructions  General Discharge Instructions :  If you need to reach your doctor call: Monday-Friday 8:00 am - 4:00 pm at [phone] or toll free [phone].  After clinic hours [phone] to have operator reach doctor.  Bring all of your medication bottles to all your appointments in the pain clinic.  To cancel or reschedule your appointment with Pain Management please remember to call 24 hours in advance to avoid a fee.  Refer to the educational materials which you have been given on: General Risks, I had my Procedure. Discharge Instructions, Post Sedation.  Post Procedure Instructions:  The drugs you were given will stay in your system until tomorrow, so for the next 24 hours you should not drive, make any legal decisions or drink any alcoholic beverages.  You may eat anything you prefer, but it is better to start with liquids then soups and crackers, and gradually work up to solid foods.  Please notify your doctor immediately if you have any unusual bleeding, trouble breathing or pain that is not related to your normal pain.  Depending on the type of procedure that was done, some parts of your body may feel week and/or numb.  This usually clears up by tonight or the next day.  Walk with the use of an assistive device or accompanied by an adult for the 24 hours.  You may use ice on the affected area for the first 24 hours.  Put ice in a Ziploc bag and cover with a towel and place against area 15 minutes on 15 minutes off.  You may switch to heat after 24 hours.Epidural Steroid Injection Patient Information  Description: The epidural space surrounds the nerves as they exit the spinal cord.  In some patients, the nerves can be compressed and inflamed by a bulging disc or a tight spinal canal (spinal stenosis).  By injecting steroids into the epidural space, we can bring irritated nerves into direct contact with a potentially helpful medication.  These  steroids act directly on the irritated nerves and can reduce swelling and inflammation which often leads to decreased pain.  Epidural steroids may be injected anywhere along the spine and from the neck to the low back depending upon the location of your pain.   After numbing the skin with local anesthetic (like Novocaine), a small needle is passed into the epidural space slowly.  You may experience a sensation of pressure while this is being done.  The entire block usually last less than 10 minutes.  Conditions which may be treated by epidural steroids:   Low back and leg pain  Neck and arm pain  Spinal stenosis  Post-laminectomy syndrome  Herpes zoster (shingles) pain  Pain from compression fractures  Preparation for the injection:  1. Do not eat any solid food or dairy products within 6 hours of your appointment.  2. You may drink clear liquids up to 2 hours before appointment.  Clear liquids include water, black coffee, juice or soda.  No milk or cream please. 3. You may take your regular medication, including pain medications, with a sip of water before your appointment  Diabetics should hold regular insulin (if taken separately) and take 1/2 normal NPH dos the morning of the procedure.  Carry some sugar containing items with you to your appointment. 4. A driver must accompany you and be prepared to drive you home after your procedure.  5. Bring all your current medications with your. 6. An IV may be inserted and   sedation may be given at the discretion of the physician.   7. A blood pressure cuff, EKG and other monitors will often be applied during the procedure.  Some patients may need to have extra oxygen administered for a short period. 8. You will be asked to provide medical information, including your allergies, prior to the procedure.  We must know immediately if you are taking blood thinners (like Coumadin/Warfarin)  Or if you are allergic to IV iodine contrast (dye). We must  know if you could possible be pregnant.  Possible side-effects:  Bleeding from needle site  Infection (rare, may require surgery)  Nerve injury (rare)  Numbness & tingling (temporary)  Difficulty urinating (rare, temporary)  Spinal headache ( a headache worse with upright posture)  Light -headedness (temporary)  Pain at injection site (several days)  Decreased blood pressure (temporary)  Weakness in arm/leg (temporary)  Pressure sensation in back/neck (temporary)  Call if you experience:  Fever/chills associated with headache or increased back/neck pain.  Headache worsened by an upright position.  New onset weakness or numbness of an extremity below the injection site  Hives or difficulty breathing (go to the emergency room)  Inflammation or drainage at the infection site  Severe back/neck pain  Any new symptoms which are concerning to you  Please note:  Although the local anesthetic injected can often make your back or neck feel good for several hours after the injection, the pain will likely return.  It takes 3-7 days for steroids to work in the epidural space.  You may not notice any pain relief for at least that one week.  If effective, we will often do a series of three injections spaced 3-6 weeks apart to maximally decrease your pain.  After the initial series, we generally will wait several months before considering a repeat injection of the same type.  If you have any questions, please call [phone] [facility] 

## 2015-07-10 ENCOUNTER — Telehealth: Payer: Self-pay | Admitting: *Deleted

## 2015-07-15 ENCOUNTER — Other Ambulatory Visit: Payer: Self-pay | Admitting: Pain Medicine

## 2015-07-15 NOTE — Telephone Encounter (Signed)
Call back complete

## 2015-07-23 ENCOUNTER — Ambulatory Visit: Payer: 59 | Admitting: Pain Medicine

## 2015-07-25 ENCOUNTER — Other Ambulatory Visit: Payer: Self-pay | Admitting: *Deleted

## 2015-07-25 DIAGNOSIS — F411 Generalized anxiety disorder: Secondary | ICD-10-CM

## 2015-07-25 MED ORDER — ALPRAZOLAM 1 MG PO TABS: ORAL_TABLET | ORAL | Status: DC

## 2015-07-25 MED ORDER — CITALOPRAM HYDROBROMIDE 40 MG PO TABS
40.0000 mg | ORAL_TABLET | Freq: Every day | ORAL | Status: DC
Start: 2015-07-25 — End: 2015-10-17

## 2015-07-25 NOTE — Telephone Encounter (Signed)
Patient requested Rx to be faxed to [facility]. Rx printed and faxed. Informed patient that she will need an appointment before anymore refills. She stated that she will call back and schedule.

## 2015-07-25 NOTE — Telephone Encounter (Signed)
[facility]-Patient aware she needs an appointment before anymore refills can be given.

## 2015-09-18 ENCOUNTER — Encounter: Payer: 59 | Admitting: Pain Medicine

## 2015-09-19 ENCOUNTER — Encounter: Payer: Self-pay | Admitting: Pain Medicine

## 2015-09-19 ENCOUNTER — Other Ambulatory Visit: Payer: Self-pay | Admitting: Pain Medicine

## 2015-09-19 ENCOUNTER — Ambulatory Visit: Payer: BLUE CROSS/BLUE SHIELD | Attending: Pain Medicine | Admitting: Pain Medicine

## 2015-09-19 VITALS — BP 121/73 | HR 73 | Temp 98.9°F | Resp 16 | Ht 68.0 in | Wt 167.0 lb

## 2015-09-19 DIAGNOSIS — E1140 Type 2 diabetes mellitus with diabetic neuropathy, unspecified: Secondary | ICD-10-CM | POA: Insufficient documentation

## 2015-09-19 DIAGNOSIS — G4700 Insomnia, unspecified: Secondary | ICD-10-CM | POA: Insufficient documentation

## 2015-09-19 DIAGNOSIS — M26609 Unspecified temporomandibular joint disorder, unspecified side: Secondary | ICD-10-CM | POA: Diagnosis not present

## 2015-09-19 DIAGNOSIS — M150 Primary generalized (osteo)arthritis: Secondary | ICD-10-CM

## 2015-09-19 DIAGNOSIS — M79606 Pain in leg, unspecified: Secondary | ICD-10-CM | POA: Diagnosis not present

## 2015-09-19 DIAGNOSIS — Z5181 Encounter for therapeutic drug level monitoring: Secondary | ICD-10-CM | POA: Diagnosis not present

## 2015-09-19 DIAGNOSIS — M1991 Primary osteoarthritis, unspecified site: Secondary | ICD-10-CM | POA: Insufficient documentation

## 2015-09-19 DIAGNOSIS — M47812 Spondylosis without myelopathy or radiculopathy, cervical region: Secondary | ICD-10-CM | POA: Diagnosis not present

## 2015-09-19 DIAGNOSIS — F418 Other specified anxiety disorders: Secondary | ICD-10-CM | POA: Diagnosis not present

## 2015-09-19 DIAGNOSIS — G8929 Other chronic pain: Secondary | ICD-10-CM | POA: Insufficient documentation

## 2015-09-19 DIAGNOSIS — K219 Gastro-esophageal reflux disease without esophagitis: Secondary | ICD-10-CM | POA: Diagnosis not present

## 2015-09-19 DIAGNOSIS — K589 Irritable bowel syndrome without diarrhea: Secondary | ICD-10-CM | POA: Diagnosis not present

## 2015-09-19 DIAGNOSIS — M50222 Other cervical disc displacement at C5-C6 level: Secondary | ICD-10-CM | POA: Diagnosis not present

## 2015-09-19 DIAGNOSIS — M5126 Other intervertebral disc displacement, lumbar region: Secondary | ICD-10-CM | POA: Diagnosis not present

## 2015-09-19 DIAGNOSIS — M5416 Radiculopathy, lumbar region: Secondary | ICD-10-CM | POA: Insufficient documentation

## 2015-09-19 DIAGNOSIS — F17210 Nicotine dependence, cigarettes, uncomplicated: Secondary | ICD-10-CM | POA: Insufficient documentation

## 2015-09-19 DIAGNOSIS — M159 Polyosteoarthritis, unspecified: Secondary | ICD-10-CM | POA: Insufficient documentation

## 2015-09-19 DIAGNOSIS — M4726 Other spondylosis with radiculopathy, lumbar region: Secondary | ICD-10-CM

## 2015-09-19 DIAGNOSIS — M47892 Other spondylosis, cervical region: Secondary | ICD-10-CM | POA: Insufficient documentation

## 2015-09-19 DIAGNOSIS — M545 Low back pain, unspecified: Secondary | ICD-10-CM

## 2015-09-19 DIAGNOSIS — F1120 Opioid dependence, uncomplicated: Secondary | ICD-10-CM | POA: Diagnosis not present

## 2015-09-19 DIAGNOSIS — R937 Abnormal findings on diagnostic imaging of other parts of musculoskeletal system: Secondary | ICD-10-CM

## 2015-09-19 DIAGNOSIS — E785 Hyperlipidemia, unspecified: Secondary | ICD-10-CM | POA: Diagnosis not present

## 2015-09-19 DIAGNOSIS — M549 Dorsalgia, unspecified: Secondary | ICD-10-CM | POA: Diagnosis present

## 2015-09-19 DIAGNOSIS — Z79891 Long term (current) use of opiate analgesic: Secondary | ICD-10-CM | POA: Diagnosis not present

## 2015-09-19 DIAGNOSIS — M542 Cervicalgia: Secondary | ICD-10-CM | POA: Diagnosis present

## 2015-09-19 DIAGNOSIS — E1142 Type 2 diabetes mellitus with diabetic polyneuropathy: Secondary | ICD-10-CM

## 2015-09-19 DIAGNOSIS — F17200 Nicotine dependence, unspecified, uncomplicated: Secondary | ICD-10-CM | POA: Insufficient documentation

## 2015-09-19 DIAGNOSIS — G4730 Sleep apnea, unspecified: Secondary | ICD-10-CM | POA: Insufficient documentation

## 2015-09-19 DIAGNOSIS — I10 Essential (primary) hypertension: Secondary | ICD-10-CM | POA: Diagnosis not present

## 2015-09-19 DIAGNOSIS — Z9289 Personal history of other medical treatment: Secondary | ICD-10-CM | POA: Insufficient documentation

## 2015-09-19 LAB — COMPREHENSIVE METABOLIC PANEL
ALBUMIN: 4.9 g/dL (ref 3.5–5.0)
ALT: 18 U/L (ref 14–54)
AST: 24 U/L (ref 15–41)
Alkaline Phosphatase: 86 U/L (ref 38–126)
Anion gap: 9 (ref 5–15)
BILIRUBIN TOTAL: 0.6 mg/dL (ref 0.3–1.2)
BUN: 12 mg/dL (ref 6–20)
CO2: 31 mmol/L (ref 22–32)
Calcium: 9.8 mg/dL (ref 8.9–10.3)
Chloride: 99 mmol/L — ABNORMAL LOW (ref 101–111)
Creatinine, Ser: 0.63 mg/dL (ref 0.44–1.00)
GFR calc Af Amer: >60 mL/min (ref >60)
GFR calc non Af Amer: >60 mL/min (ref >60)
GLUCOSE: 98 mg/dL (ref 65–99)
POTASSIUM: 3.6 mmol/L (ref 3.5–5.1)
Sodium: 139 mmol/L (ref 135–145)
TOTAL PROTEIN: 8.3 g/dL — AB (ref 6.5–8.1)

## 2015-09-19 LAB — MAGNESIUM: Magnesium: 2.2 mg/dL (ref 1.7–2.4)

## 2015-09-19 LAB — SEDIMENTATION RATE: Sed Rate: 31 mm/hr — ABNORMAL HIGH (ref 0–20)

## 2015-09-19 LAB — HEMOGLOBIN A1C: Hgb A1c MFr Bld: 6.1 % — ABNORMAL HIGH (ref 4.0–6.0)

## 2015-09-19 MED ORDER — OXYCODONE HCL 10 MG PO TABS: 20.0000 mg | ORAL_TABLET | Freq: Three times a day (TID) | ORAL | Status: DC | PRN

## 2015-09-19 MED ORDER — GABAPENTIN 600 MG PO TABS: 600.0000 mg | ORAL_TABLET | Freq: Four times a day (QID) | ORAL | Status: DC

## 2015-09-19 MED ORDER — GABAPENTIN 300 MG PO CAPS: 300.0000 mg | ORAL_CAPSULE | Freq: Four times a day (QID) | ORAL | Status: DC

## 2015-09-19 NOTE — Progress Notes (Signed)
Safety precautions to be maintained throughout the outpatient stay will include: orient to surroundings, keep bed in low position, maintain call bell within reach at all times, provide assistance with transfer out of bed and ambulation. Oxycodone pill count # 8

## 2015-09-19 NOTE — Progress Notes (Signed)
Patient's Name: Denise Webb MRN: [id number] DOB: [birth year] DOS: 09/19/2015  Primary Reason(s) for Visit: Post-Procedure evaluation and medication management. CC: Back Pain; Neck Pain; Leg Pain; and Hand Pain   HPI  Denise Webb is a 50 y.o. year old, female patient, who returns today as an established patient. She has HYPERTRIGLYCERIDEMIA; ANXIETY DEPRESSION; Tobacco abuse; Essential hypertension, benign; Irritable bowel syndrome; Neuropathic pain; DM type 2, uncontrolled, with neuropathy ([place]); Other and unspecified hyperlipidemia; Polyneuropathic pain; Encounter for therapeutic drug monitoring; Blister of foot without infection; Depression; Ingrown toenail; Hyperlipidemia LDL goal <100; Insomnia; Abnormality of gait; Paresthesia; Generalized anxiety disorder; 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[place]); Chronic pain; Long term current use of opiate analgesic; Long term prescription opiate use; Opiate use; Encounter for therapeutic drug level monitoring; Chronic low back pain (Location of Tertiary source of pain) (Bilateral) (R>L); Lumbar facet syndrome (Bilateral) (R>L); Diabetic peripheral neuropathy (HCC) (Location of Primary Source of Pain) (Bilateral) (Feet) (L>R); Opiate dependence ([place]); History of migraine; History of head injury; Sleep apnea; GERD (gastroesophageal reflux disease); Hiatal hernia; Lumbar facet arthropathy (L2-3, L3-4, L4-5, and L5-S1); Lumbar disc herniation (L5-S1); Cervical disc bulge (Left) (C5-6); Chronic neck pain; Cervical spondylosis; Lumbar spondylosis; Nicotine dependence; Chronic lower extremity pain (Location of Primary Source of Pain) (Bilateral) (L>R); Chronic lumbar radicular pain (Bilateral) (R>L) (Right L5 Dermatome); Osteoarthritis; Lumbar radiculopathy (Bilateral) (EMG performed on 06/18/2014); History of negative lower extremity EMG; and Abnormal MRI, lumbar spine on her problem list.. Her primarily concern today is the Back Pain; Neck  Pain; Leg Pain; and Hand Pain   The patient returns to the clinic today after having had a right sided L5-S1 lumbar epidural steroid injection #1 under fluoroscopic guidance and IV sedation. This procedure provided the patient with 100% relief of the pain for the duration of the local anesthetic that also persisted for about a week before it started going down. She indicates continuing to experience about 25% benefit in her feet.  The patient describes her primary pain as being that of her feet were it hurt on both sides but the left side is worse than the right. She describes his pain as a burning type of sensation that does seem to benefit from the gabapentin 600 mg by mouth 4 times a day. She indicates having no problems with the gabapentin. Today we have encouraged the patient to start going up on it further and therefore we will provide her with a prescription for not only to 600 mg pills, but also 300 mg pills where she can start taking 1 601 300 mg pill at a time, as tolerated. Once she gets to a total of 900 mg 4 times a day, I will switch her to the 800 mg pills.  In addition to this, the patient indicates taking oxycodone IR 20 mg 1 tablet by mouth 3 times a day. She indicates that she has been taking this medication for the past 20 half years. Going a little bit into her opioid intake history, we see that she started with hydrocodone/APAP 10/325 one tablet by mouth 3 times a day. This was later increased to 4 times a day. Following this, the patient was then switched to oxycodone 10 mg which she started taking one tablet by mouth 3 times a day she took this medication for approximately a year before it to be ineffective. At that time, she was then switched to the oxycodone 20 mg 1 tablet by mouth 3 times a day and she has been on  this for the past 1-1/2 years. Today she comes in requesting that I increase her oxycodone to 4 times a day.  Today I took the time to talk to the patient about the  concept of "Tolerance" and how we need to start managing this properly with the use of "Drug Holidays". The patient indicates having read something about this and today we have provided her again with another handout explaining in detail how to go about doing a drug holiday. She was not thrilled about this idea but she does understand that she cannot continue increasing the doses of the opioids every time that she encounters tolerance.  Today I asked the patient if she ever had a nerve conduction test and she indicated that [facility]. Today we had her sign a release form so that we can get a copy of this nerve conduction test. She indicates that it was a nerve conduction test of the lower extremities and she has never had one of the upper extremities even though she is beginning to feel that neuropathy invading her upper extremities. She indicates that the right side is worse than the left.  Reported Pain Score: 7 , clinically she looks like a 1-2/10. Reported level is inconsistent with clinical obrservations. Pain Type: Chronic pain Pain Location: Back Pain Orientation:  (bilateral) Pain Descriptors / Indicators: Aching, Sharp, Burning, Pins and needles, Shooting Pain Frequency: Constant  Date of Last Visit: 07/09/15 ([facility])    Pharmacotherapy  Medication(s): Oxycodone IR 20 mg by mouth 3 times a day. Onset of action: Within expected pharmacological parameters. (20 minutes) Time to Peak effect: Timing and results are as within normal expected parameters. (30-35 minutes) Analgesic Effect: At least 50% Activity Facilitation: Medication(s) allow patient to sit, stand, walk, and do the basic ADLs Perceived Effectiveness: Described as relatively effective, allowing for increase in activities of daily living (ADL) Side-effects or Adverse reactions: None reported. In fact the patient indicates that the opioids in the usual constipation that  they normally will cause has come in very useful for her IBS with diarrhea. Duration of action: Within normal limits for medication. (5.5 hours) [place] PMP: Compliant with practice rules and regulations UDS Results: Last UDS done on 06/24/2015 was within normal limits for the opioids with no unexpected results. It did contain some benzodiazepines which apparently the patient had not reported as taking or we failed to add to the list of medications that she was taking. UDS Interpretation: Abnormal results discussed with patient Medication Assessment Form: Reviewed. Patient indicates being compliant with therapy Treatment compliance: Compliant Substance Use Disorder (SUD) Risk Level: Low. Even though this patient so far has had a low risk, I am a little concerned about this because of the facial expressions that she demonstrated during the time that I was explained to her about drug holidays. Pharmacologic Plan: We will continue with therapy at the same dose, but we will change the formulation to 10 mg pills so that when the time comes, he will be easier for her to titrate the dose down to stop it reported drug holidays.  Lab Work: Illicit Drugs No results found for: THCU, COCAINSCRNUR, PCPSCRNUR, MDMA, AMPHETMU, METHADONE, ETOH  Inflammation Markers Lab Results  Component Value Date   ESRSEDRATE 31* 09/19/2015    Renal Function Lab Results  Component Value Date   BUN 12 09/19/2015   CREATININE 0.63 09/19/2015   GFRAA >60 09/19/2015   GFRNONAA >60 09/19/2015  Hepatic Function Lab Results  Component Value Date   AST 24 09/19/2015   ALT 18 09/19/2015   ALBUMIN 4.9 09/19/2015    Electrolytes Lab Results  Component Value Date   NA 139 09/19/2015   K 3.6 09/19/2015   CL 99* 09/19/2015   CALCIUM 9.8 09/19/2015   MG 2.2 09/19/2015    Post-Procedure Assessment  Procedure done on last visit: Right L5-S1 lumbar epidural steroid injection #1 under fluoroscopic guidance, with IV  sedation. Side-effects or Adverse reactions: None reported Sedation: Procedure was performed with sedation  Results: Ultra-Short Term Relief (First 1 hour after procedure): 100 %  Possibly the results is influenced by the pharmacodynamic effect of the local anesthetic, as well as that of the intravenous analgesics and/or sedatives, when used Short Term Relief (Initial 4-6 hrs after procedure): 100 % Short-term relief confirms injected site to be the source of pain Long Term Relief :  (75  x 1 week). She actually indicated to me that she had 100% relief of the pain for one week and then it started coming back. Long-term benefit would suggest an inflammatory etiology to the pain   Current Relief (Now):  25% relief of the extremity to pain. Interestingly, this relieves seems to be of the burning sensation of her feet. This could suggest that the patient continues to enjoy some benefit from the sympathetic lytic effect of the local anesthetics rather than the anti-inflammatory effect of the steroids. In the future we may need to consider doing a lumbar sympathetic block to determine if this is the case. Interpretation of Results: Because this first injection proved to be effective but not long-lasting, we have decided to schedule a second injection to then compare the duration of benefit between the 2 of them.  Allergies  Denise Webb is allergic to erythromycin stearate and morphine and related.  Meds  The patient has a current medication list which includes the following prescription(s): alprazolam, aspirin-acetaminophen-caffeine, atenolol, atorvastatin, calcium acetate (phos binder), citalopram, gabapentin, glucose blood, hydrochlorothiazide, acidophilus probiotic, loperamide, metformin, neomycin-bacitracin-polymyxin, omeprazole, promethazine, triamcinolone, gabapentin, oxycodone hcl, oxycodone hcl, and oxycodone hcl.  Current Outpatient Prescriptions on File Prior to Visit  Medication Sig   . ALPRAZolam (XANAX) 1 MG tablet Take one tablet by mouth three times daily as needed for anxiety  . aspirin-acetaminophen-caffeine (EXCEDRIN MIGRAINE) 250-250-65 MG per tablet Take by mouth every 6 (six) hours as needed for headache.  Marland Kitchen atenolol (TENORMIN) 25 MG tablet TAKE 1 TABLET BY MOUTH EVERY DAY  . atorvastatin (LIPITOR) 10 MG tablet TAKE 1 TABLET (10 MG TOTAL) BY MOUTH DAILY.  . Calcium Acetate, Phos Binder, (CALCIUM ACETATE PO) Take by mouth. By mouth every other day.  . citalopram (CELEXA) 40 MG tablet Take 1 tablet (40 mg total) by mouth daily.  Marland Kitchen glucose blood test strip 1 each by Other route as needed for other. Reli on prime: check blood sugar twice daily DX: 250.62  . hydrochlorothiazide (HYDRODIURIL) 25 MG tablet TAKE 1 TABLET (25 MG TOTAL) BY MOUTH DAILY.  . Lactobacillus (ACIDOPHILUS PROBIOTIC) 100 MG CAPS Take 1 capsule (100 mg total) by mouth daily.  Marland Kitchen loperamide (IMODIUM A-D) 2 MG tablet Take 2 mg by mouth 3 (three) times daily as needed. Take 2 tabs by mouth once daily as needed for diarrhea.  . metFORMIN (GLUCOPHAGE) 500 MG tablet TAKE 1 TABLET (500 MG TOTAL) BY MOUTH 2 (TWO) TIMES DAILY WITH A MEAL.  Marland Kitchen neomycin-bacitracin-polymyxin (NEOSPORIN) [phone] ointment Apply daily to sores on feet  .  omeprazole (PRILOSEC) 20 MG capsule TAKE 1 CAPSULE (20 MG TOTAL) BY MOUTH DAILY.  Marland Kitchen promethazine (PHENERGAN) 12.5 MG tablet TAKE 1 TABLET (12.5 MG TOTAL) BY MOUTH EVERY 8 (EIGHT) HOURS AS NEEDED FOR NAUSEA OR VOMITING.  . triamcinolone (NASACORT AQ) 55 MCG/ACT AERO nasal inhaler 2 sprays in each nostril daily (Patient taking differently: 2 sprays in each nostril daily Easy pop top on all medications)   No current facility-administered medications on file prior to visit.    ROS  Constitutional: Afebrile, no chills, well hydrated and well nourished Gastrointestinal: negative. The patient does have a history of IBS with diarrhea. Musculoskeletal:negative Neurological:  negative Behavioral/Psych: negative  PFSH  Medical:  Denise Webb  has a past medical history of Hypertension; Colitis; Plantar fasciitis; Anxiety; Depression; Neuropathy ([place]); Hyperlipidemia; TMJ (dislocation of temporomandibular joint); IBS (irritable bowel syndrome) (2011-2014); GERD (gastroesophageal reflux disease); Internal hemorrhoids; Trichomoniasis; History of head injury (06/25/2015); Diabetic peripheral neuropathy associated with type 2 diabetes mellitus ([place]) (06/24/2015); Abdominal pain, unspecified site (09/19/2013); and Lumbar radicular pain (Bilateral) (R>L) (Right L5 Dermatome) (10/09/2014). Family: family history includes Anxiety disorder in her brother; Cancer in her brother; Cancer (age of onset: 32) in her brother; Colitis in her brother; Heart attack in her father, mother, and other; Heart disease in her brother and mother. Surgical:  has past surgical history that includes Tubal ligation; Cesarean section (1988); Pelvic exenteration (1990); and tubes tied (2004/2005). Tobacco:  reports that she has been smoking Cigarettes.  She has been smoking about 1.00 pack per day. She has never used smokeless tobacco. Alcohol:  reports that she drinks about 0.6 oz of alcohol per week. Drug:  reports that she does not use illicit drugs.  Physical Exam  Vitals:  Today's Vitals   09/19/15 1157 09/19/15 1200  BP: 121/73   Pulse: 73   Temp: 98.9 F (37.2 C)   Resp: 16   Height: 5\' 8"  (1.727 m)   Weight: 167 lb (75.751 kg)   SpO2: 100%   PainSc: 7  7   PainLoc: Back     Calculated BMI: Body mass index is 25.4 kg/(m^2).  General appearance: alert, cooperative, appears stated age, mild distress and morbidly obese Eyes: PERLA Respiratory: No evidence respiratory distress, no audible rales or ronchi and no use of accessory muscles of respiration  Cervical Spine Inspection: Normal anatomy Alignment: Symetrical ROM: Decreased  Upper Extremities Inspection: No gross anomalies  detected ROM: Adequate Sensory: Normal Motor: Grossly intact  Thoracic Spine Inspection: No gross anomalies detected Alignment: Symetrical ROM: Adequate Palpation: WNL  Lumbar Spine Inspection: No gross anomalies detected Alignment: Symetrical ROM: Decreased Palpation: Tender Provocative Tests: Lumbar Hyperextension and rotation test: Positive bilaterally with the right being worst on the left. Patrick's Maneuver: deferred Gait: Antalgic (limping)  Lower Extremities Inspection: No gross anomalies detected ROM: Adequate Sensory: Normal Motor: Grossly intact  Toe walk (S1): WNL  Heal walk (L5): WNL  Assessment & Plan  Primary Diagnosis & Pertinent Problem List: The primary encounter diagnosis was Chronic pain. Diagnoses of Long term current use of opiate analgesic, Encounter for therapeutic drug monitoring, Lumbar disc herniation (L5-S1), Lumbar radicular pain (Bilateral) (R>L) (Right L5 Dermatome), Diabetic peripheral neuropathy associated with type 2 diabetes mellitus ([place]), Cervical spondylosis, Chronic pain of lower extremity, unspecified laterality, Chronic lumbar radicular pain (Bilateral) (R>L) (Right L5 Dermatome), Primary osteoarthritis involving multiple joints, Lumbar radiculopathy (Bilateral) (EMG performed on 06/18/2014), History of EMG, Abnormal MRI, lumbar spine, Osteoarthritis of spine with radiculopathy, lumbar region, Chronic low back  pain (Location of Tertiary source of pain) (Bilateral) (R>L), and Diabetic peripheral neuropathy (HCC) (Location of Primary Source of Pain) (Bilateral) (Feet) (L>R) were also pertinent to this visit.  Visit Diagnosis: 1. Chronic pain   2. Long term current use of opiate analgesic   3. Encounter for therapeutic drug monitoring   4. Lumbar disc herniation (L5-S1)   5. Lumbar radicular pain (Bilateral) (R>L) (Right L5 Dermatome)   6. Diabetic peripheral neuropathy associated with type 2 diabetes mellitus (HCC)   7. Cervical  spondylosis   8. Chronic pain of lower extremity, unspecified laterality   9. Chronic lumbar radicular pain (Bilateral) (R>L) (Right L5 Dermatome)   10. Primary osteoarthritis involving multiple joints   11. Lumbar radiculopathy (Bilateral) (EMG performed on 06/18/2014)   12. History of EMG   13. Abnormal MRI, lumbar spine   14. Osteoarthritis of spine with radiculopathy, lumbar region   15. Chronic low back pain (Location of Tertiary source of pain) (Bilateral) (R>L)   16. Diabetic peripheral neuropathy (HCC) (Location of Primary Source of Pain) (Bilateral) (Feet) (L>R)     Assessment: No problem-specific assessment & plan notes found for this encounter.   Plan of Care  Pharmacotherapy (Medications Ordered): Meds ordered this encounter  Medications  . Oxycodone HCl 10 MG TABS    Sig: Take 2 tablets (20 mg total) by mouth every 8 (eight) hours as needed.    Dispense:  180 tablet    Refill:  0    Do not place this medication, or any other prescription from our practice, on "Automatic Refill". Patient may have prescription filled one day early if pharmacy is closed on scheduled refill date. Do not fill until: 09/21/15 To last until: 10/21/15  . Oxycodone HCl 10 MG TABS    Sig: Take 2 tablets (20 mg total) by mouth every 8 (eight) hours as needed.    Dispense:  180 tablet    Refill:  0    Do not place this medication, or any other prescription from our practice, on "Automatic Refill". Patient may have prescription filled one day early if pharmacy is closed on scheduled refill date. Do not fill until: 10/21/15 To last until: 11/17/15  . Oxycodone HCl 10 MG TABS    Sig: Take 2 tablets (20 mg total) by mouth every 8 (eight) hours as needed.    Dispense:  180 tablet    Refill:  0    Do not place this medication, or any other prescription from our practice, on "Automatic Refill". Patient may have prescription filled one day early if pharmacy is closed on scheduled refill date. Do not  fill until: 11/17/15 To last until: 12/17/15  . DISCONTD: gabapentin (NEURONTIN) 600 MG tablet    Sig: Take 1 tablet (600 mg total) by mouth every 6 (six) hours.    Dispense:  120 tablet    Refill:  2    Do not place this medication, or any other prescription from our practice, on "Automatic Refill". Patient may have prescription filled one day early if pharmacy is closed on scheduled refill date. Do not fill until:  To last until:  . gabapentin (NEURONTIN) 300 MG capsule    Sig: Take 1 capsule (300 mg total) by mouth 4 (four) times daily.    Dispense:  120 capsule    Refill:  2    Do not place this medication, or any other prescription from our practice, on "Automatic Refill". Patient may have prescription filled one day early if  pharmacy is closed on scheduled refill date.  . gabapentin (NEURONTIN) 600 MG tablet    Sig: Take 1 tablet (600 mg total) by mouth every 6 (six) hours.    Dispense:  120 tablet    Refill:  2    Do not place this medication, or any other prescription from our practice, on "Automatic Refill". Patient may have prescription filled one day early if pharmacy is closed on scheduled refill date.    Lab-work & Procedure Ordered: Orders Placed This Encounter  Procedures  . LUMBAR EPIDURAL STEROID INJECTION    Standing Status: Future     Number of Occurrences:      Standing Expiration Date: 09/18/2016    Scheduling Instructions:     Side: Right-sided (L5-S1)     Sedation: With Sedation.     Timeframe: ASAP    Order Specific Question:  Where will this procedure be performed?    Answer:  [facility]  . Drugs of abuse screen w/o alc, rtn urine-sln    Volume: 10 ml(s). Minimum 3 ml of urine is needed. Document temperature of fresh sample. Indications: Long term (current) use of opiate analgesic (Z79.891) Test#: [id number] ([employer] Select-13)  . Comprehensive metabolic panel    Order Specific Question:  Has the patient fasted?    Answer:  No  . C-reactive  protein  . Magnesium  . Sedimentation rate  . Vitamin B12    Indication: Bone Pain (M89.9)  . Vitamin D pnl(25-hydrxy+1,25-dihy)-bld  . Hemoglobin A1C    Imaging Ordered: None  Interventional Therapies: Scheduled: Right L5-S1 lumbar epidural steroid injection #2 under fluoroscopic guidance and IV sedation. PRN Procedures: Right L5-S1 lumbar epidural steroid injection #3, under fluoroscopic guidance and IV sedation.    Referral(s) or Consult(s): None at this time.  Medications administered during this visit: Denise Webb had no medications administered during this visit.  Future Appointments Date Time Provider [place]  11/07/2015 9:20 AM Milinda Pointer, MD [facility] None    Primary Care Physician: Gildardo Cranker, DO Location: [facility] Note by: Kathlen Brunswick. Dossie Arbour, M.D, DABA, DABAPM, DABPM, DABIPP, FIPP

## 2015-09-19 NOTE — Patient Instructions (Addendum)
Smoking Cessation, Tips for Success If you are ready to quit smoking, congratulations! You have chosen to help yourself be healthier. Cigarettes bring nicotine, tar, carbon monoxide, and other irritants into your body. Your lungs, heart, and blood vessels will be able to work better without these poisons. There are many different ways to quit smoking. Nicotine gum, nicotine patches, a nicotine inhaler, or nicotine nasal spray can help with physical craving. Hypnosis, support groups, and medicines help break the habit of smoking. WHAT THINGS CAN I DO TO MAKE QUITTING EASIER?  Here are some tips to help you quit for good: 1. Pick a date when you will quit smoking completely. Tell all of your friends and family about your plan to quit on that date. 2. Do not try to slowly cut down on the number of cigarettes you are smoking. Pick a quit date and quit smoking completely starting on that day. 3. Throw away all cigarettes.  4. Clean and remove all ashtrays from your home, work, and car. 5. On a card, write down your reasons for quitting. Carry the card with you and read it when you get the urge to smoke. 6. Cleanse your body of nicotine. Drink enough water and fluids to keep your urine clear or pale yellow. Do this after quitting to flush the nicotine from your body. 7. Learn to predict your moods. Do not let a bad situation be your excuse to have a cigarette. Some situations in your life might tempt you into wanting a cigarette. 8. Never have "just one" cigarette. It leads to wanting another and another. Remind yourself of your decision to quit. 9. Change habits associated with smoking. If you smoked while driving or when feeling stressed, try other activities to replace smoking. Stand up when drinking your coffee. Brush your teeth after eating. Sit in a different chair when you read the paper. Avoid alcohol while trying to quit, and try to drink fewer caffeinated beverages. Alcohol and caffeine may urge you  to smoke. 10. Avoid foods and drinks that can trigger a desire to smoke, such as sugary or spicy foods and alcohol. 11. Ask people who smoke not to smoke around you. 12. Have something planned to do right after eating or having a cup of coffee. For example, plan to take a walk or exercise. 13. Try a relaxation exercise to calm you down and decrease your stress. Remember, you may be tense and nervous for the first 2 weeks after you quit, but this will pass. 14. Find new activities to keep your hands busy. Play with a pen, coin, or rubber band. Doodle or draw things on paper. 15. Brush your teeth right after eating. This will help cut down on the craving for the taste of tobacco after meals. You can also try mouthwash.  16. Use oral substitutes in place of cigarettes. Try using lemon drops, carrots, cinnamon sticks, or chewing gum. Keep them handy so they are available when you have the urge to smoke. 17. When you have the urge to smoke, try deep breathing. 18. Designate your home as a nonsmoking area. 19. If you are a heavy smoker, ask your health care provider about a prescription for nicotine chewing gum. It can ease your withdrawal from nicotine. 20. Reward yourself. Set aside the cigarette money you save and buy yourself something nice. 21. Look for support from others. Join a support group or smoking cessation program. Ask someone at home or at work to help you with your plan   to quit smoking. 22. Always ask yourself, "Do I need this cigarette or is this just a reflex?" Tell yourself, "Today, I choose not to smoke," or "I do not want to smoke." You are reminding yourself of your decision to quit. 23. Do not replace cigarette smoking with electronic cigarettes (commonly called e-cigarettes). The safety of e-cigarettes is unknown, and some may contain harmful chemicals. 24. If you relapse, do not give up! Plan ahead and think about what you will do the next time you get the urge to smoke. HOW WILL  I FEEL WHEN I QUIT SMOKING? You may have symptoms of withdrawal because your body is used to nicotine (the addictive substance in cigarettes). You may crave cigarettes, be irritable, feel very hungry, cough often, get headaches, or have difficulty concentrating. The withdrawal symptoms are only temporary. They are strongest when you first quit but will go away within 10-14 days. When withdrawal symptoms occur, stay in control. Think about your reasons for quitting. Remind yourself that these are signs that your body is healing and getting used to being without cigarettes. Remember that withdrawal symptoms are easier to treat than the major diseases that smoking can cause.  Even after the withdrawal is over, expect periodic urges to smoke. However, these cravings are generally short lived and will go away whether you smoke or not. Do not smoke! WHAT RESOURCES ARE AVAILABLE TO HELP ME QUIT SMOKING? Your health care provider can direct you to community resources or hospitals for support, which may include: 1. Group support. 2. Education. 3. Hypnosis. 4. Therapy.   This information is not intended to replace advice given to you by your health care provider. Make sure you discuss any questions you have with your health care provider.   Document Released: 05/08/2004 Document Revised: 08/31/2014 Document Reviewed: 01/26/2013 Elsevier Interactive Patient Education 2016 [employer] GENERAL RISKS AND COMPLICATIONS  What are the risk, side effects and possible complications? Generally speaking, most procedures are safe.  However, with any procedure there are risks, side effects, and the possibility of complications.  The risks and complications are dependent upon the sites that are lesioned, or the type of nerve block to be performed.  The closer the procedure is to the spine, the more serious the risks are.  Great care is taken when placing the radio frequency needles, block needles or lesioning probes,  but sometimes complications can occur. 1. Infection: Any time there is an injection through the skin, there is a risk of infection.  This is why sterile conditions are used for these blocks.  There are four possible types of infection. 1. Localized skin infection. 2. Central Nervous System Infection-This can be in the form of Meningitis, which can be deadly. 3. Epidural Infections-This can be in the form of an epidural abscess, which can cause pressure inside of the spine, causing compression of the spinal cord with subsequent paralysis. This would require an emergency surgery to decompress, and there are no guarantees that the patient would recover from the paralysis. 4. Discitis-This is an infection of the intervertebral discs.  It occurs in about 1% of discography procedures.  It is difficult to treat and it may lead to surgery.        2. Pain: the needles have to go through skin and soft tissues, will cause soreness.       3. Damage to internal structures:  The nerves to be lesioned may be near blood vessels or    other nerves which can   be potentially damaged.       4. Bleeding: Bleeding is more common if the patient is taking blood thinners such as  aspirin, Coumadin, Ticiid, Plavix, etc., or if he/she have some genetic predisposition  such as hemophilia. Bleeding into the spinal canal can cause compression of the spinal  cord with subsequent paralysis.  This would require an emergency surgery to  decompress and there are no guarantees that the patient would recover from the  paralysis.       5. Pneumothorax:  Puncturing of a lung is a possibility, every time a needle is introduced in  the area of the chest or upper back.  Pneumothorax refers to free air around the  collapsed lung(s), inside of the thoracic cavity (chest cavity).  Another two possible  complications related to a similar event would include: Hemothorax and Chylothorax.   These are variations of the Pneumothorax, where instead of air  around the collapsed  lung(s), you may have blood or chyle, respectively.       6. Spinal headaches: They may occur with any procedures in the area of the spine.       7. Persistent CSF (Cerebro-Spinal Fluid) leakage: This is a rare problem, but may occur  with prolonged intrathecal or epidural catheters either due to the formation of a fistulous  track or a dural tear.       8. Nerve damage: By working so close to the spinal cord, there is always a possibility of  nerve damage, which could be as serious as a permanent spinal cord injury with  paralysis.       9. Death:  Although rare, severe deadly allergic reactions known as "Anaphylactic  reaction" can occur to any of the medications used.      10. Worsening of the symptoms:  We can always make thing worse.  What are the chances of something like this happening? Chances of any of this occuring are extremely low.  By statistics, you have more of a chance of getting killed in a motor vehicle accident: while driving to the hospital than any of the above occurring .  Nevertheless, you should be aware that they are possibilities.  In general, it is similar to taking a shower.  Everybody knows that you can slip, hit your head and get killed.  Does that mean that you should not shower again?  Nevertheless always keep in mind that statistics do not mean anything if you happen to be on the wrong side of them.  Even if a procedure has a 1 (one) in a 1,000,000 (million) chance of going wrong, it you happen to be that one..Also, keep in mind that by statistics, you have more of a chance of having something go wrong when taking medications.  Who should not have this procedure? If you are on a blood thinning medication (e.g. Coumadin, Plavix, see list of "Blood Thinners"), or if you have an active infection going on, you should not have the procedure.  If you are taking any blood thinners, please inform your physician.  How should I prepare for this  procedure?  Do not eat or drink anything at least six hours prior to the procedure.  Bring a driver with you .  It cannot be a taxi.  Come accompanied by an adult that can drive you back, and that is strong enough to help you if your legs get weak or numb from the local anesthetic.  Take all of your medicines   the morning of the procedure with just enough water to swallow them.  If you have diabetes, make sure that you are scheduled to have your procedure done first thing in the morning, whenever possible.  If you have diabetes, take only half of your insulin dose and notify our nurse that you have done so as soon as you arrive at the clinic.  If you are diabetic, but only take blood sugar pills (oral hypoglycemic), then do not take them on the morning of your procedure.  You may take them after you have had the procedure.  Do not take aspirin or any aspirin-containing medications, at least eleven (11) days prior to the procedure.  They may prolong bleeding.  Wear loose fitting clothing that may be easy to take off and that you would not mind if it got stained with Betadine or blood.  Do not wear any jewelry or perfume  Remove any nail coloring.  It will interfere with some of our monitoring equipment.  NOTE: Remember that this is not meant to be interpreted as a complete list of all possible complications.  Unforeseen problems may occur.  BLOOD THINNERS The following drugs contain aspirin or other products, which can cause increased bleeding during surgery and should not be taken for 2 weeks prior to and 1 week after surgery.  If you should need take something for relief of minor pain, you may take acetaminophen which is found in Tylenol,m Datril, Anacin-3 and Panadol. It is not blood thinner. The products listed below are.  Do not take any of the products listed below in addition to any listed on your instruction sheet.  A.P.C or A.P.C with Codeine Codeine Phosphate Capsules #3  Ibuprofen Ridaura  ABC compound Congesprin Imuran rimadil  Advil Cope Indocin Robaxisal  Alka-Seltzer Effervescent Pain Reliever and Antacid Coricidin or Coricidin-D  Indomethacin Rufen  Alka-Seltzer plus Cold Medicine Cosprin Ketoprofen S-A-C Tablets  Anacin Analgesic Tablets or Capsules Coumadin Korlgesic Salflex  Anacin Extra Strength Analgesic tablets or capsules CP-2 Tablets Lanoril Salicylate  Anaprox Cuprimine Capsules Levenox Salocol  Anexsia-D Dalteparin Magan Salsalate  Anodynos Darvon compound Magnesium Salicylate Sine-off  Ansaid Dasin Capsules Magsal Sodium Salicylate  Anturane Depen Capsules Marnal Soma  APF Arthritis pain formula Dewitt's Pills Measurin Stanback  Argesic Dia-Gesic Meclofenamic Sulfinpyrazone  Arthritis Bayer Timed Release Aspirin Diclofenac Meclomen Sulindac  Arthritis pain formula Anacin Dicumarol Medipren Supac  Analgesic (Safety coated) Arthralgen Diffunasal Mefanamic Suprofen  Arthritis Strength Bufferin Dihydrocodeine Mepro Compound Suprol  Arthropan liquid Dopirydamole Methcarbomol with Aspirin Synalgos  ASA tablets/Enseals Disalcid Micrainin Tagament  Ascriptin Doan's Midol Talwin  Ascriptin A/D Dolene Mobidin Tanderil  Ascriptin Extra Strength Dolobid Moblgesic Ticlid  Ascriptin with Codeine Doloprin or Doloprin with Codeine Momentum Tolectin  Asperbuf Duoprin Mono-gesic Trendar  Aspergum Duradyne Motrin or Motrin IB Triminicin  Aspirin plain, buffered or enteric coated Durasal Myochrisine Trigesic  Aspirin Suppositories Easprin Nalfon Trillsate  Aspirin with Codeine Ecotrin Regular or Extra Strength Naprosyn Uracel  Atromid-S Efficin Naproxen Ursinus  Auranofin Capsules Elmiron Neocylate Vanquish  Axotal Emagrin Norgesic Verin  Azathioprine Empirin or Empirin with Codeine Normiflo Vitamin E  Azolid Emprazil Nuprin Voltaren  Bayer Aspirin plain, buffered or children's or timed BC Tablets or powders Encaprin Orgaran Warfarin Sodium   Buff-a-Comp Enoxaparin Orudis Zorpin  Buff-a-Comp with Codeine Equegesic Os-Cal-Gesic   Buffaprin Excedrin plain, buffered or Extra Strength Oxalid   Bufferin Arthritis Strength Feldene Oxphenbutazone   Bufferin plain or Extra Strength Feldene Capsules Oxycodone with Aspirin     Bufferin with Codeine Fenoprofen Fenoprofen Pabalate or Pabalate-SF   Buffets II Flogesic Panagesic   Buffinol plain or Extra Strength Florinal or Florinal with Codeine Panwarfarin   Buf-Tabs Flurbiprofen Penicillamine   Butalbital Compound Four-way cold tablets Penicillin   Butazolidin Fragmin Pepto-Bismol   Carbenicillin Geminisyn Percodan   Carna Arthritis Reliever Geopen Persantine   Carprofen Gold's salt Persistin   Chloramphenicol Goody's Phenylbutazone   Chloromycetin Haltrain Piroxlcam   Clmetidine heparin Plaquenil   Cllnoril Hyco-pap Ponstel   Clofibrate Hydroxy chloroquine Propoxyphen         Before stopping any of these medications, be sure to consult the physician who ordered them.  Some, such as Coumadin (Warfarin) are ordered to prevent or treat serious conditions such as "deep thrombosis", "pumonary embolisms", and other heart problems.  The amount of time that you may need off of the medication may also vary with the medication and the reason for which you were taking it.  If you are taking any of these medications, please make sure you notify your pain physician before you undergo any procedures.         Epidural Steroid Injection Patient Information  Description: The epidural space surrounds the nerves as they exit the spinal cord.  In some patients, the nerves can be compressed and inflamed by a bulging disc or a tight spinal canal (spinal stenosis).  By injecting steroids into the epidural space, we can bring irritated nerves into direct contact with a potentially helpful medication.  These steroids act directly on the irritated nerves and can reduce swelling and inflammation which often  leads to decreased pain.  Epidural steroids may be injected anywhere along the spine and from the neck to the low back depending upon the location of your pain.   After numbing the skin with local anesthetic (like Novocaine), a small needle is passed into the epidural space slowly.  You may experience a sensation of pressure while this is being done.  The entire block usually last less than 10 minutes.  Conditions which may be treated by epidural steroids:   Low back and leg pain  Neck and arm pain  Spinal stenosis  Post-laminectomy syndrome  Herpes zoster (shingles) pain  Pain from compression fractures  Preparation for the injection:  5. Do not eat any solid food or dairy products within 6 hours of your appointment.  6. You may drink clear liquids up to 2 hours before appointment.  Clear liquids include water, black coffee, juice or soda.  No milk or cream please. 7. You may take your regular medication, including pain medications, with a sip of water before your appointment  Diabetics should hold regular insulin (if taken separately) and take 1/2 normal NPH dos the morning of the procedure.  Carry some sugar containing items with you to your appointment. 8. A driver must accompany you and be prepared to drive you home after your procedure.  9. Bring all your current medications with your. 10. An IV may be inserted and sedation may be given at the discretion of the physician.   11. A blood pressure cuff, EKG and other monitors will often be applied during the procedure.  Some patients may need to have extra oxygen administered for a short period. 12. You will be asked to provide medical information, including your allergies, prior to the procedure.  We must know immediately if you are taking blood thinners (like Coumadin/Warfarin)  Or if you are allergic to IV iodine contrast (dye). We must   know if you could possible be pregnant.  Possible side-effects:  Bleeding from needle  site  Infection (rare, may require surgery)  Nerve injury (rare)  Numbness & tingling (temporary)  Difficulty urinating (rare, temporary)  Spinal headache ( a headache worse with upright posture)  Light -headedness (temporary)  Pain at injection site (several days)  Decreased blood pressure (temporary)  Weakness in arm/leg (temporary)  Pressure sensation in back/neck (temporary)  Call if you experience:  Fever/chills associated with headache or increased back/neck pain.  Headache worsened by an upright position.  New onset weakness or numbness of an extremity below the injection site  Hives or difficulty breathing (go to the emergency room)  Inflammation or drainage at the infection site  Severe back/neck pain  Any new symptoms which are concerning to you  Please note:  Although the local anesthetic injected can often make your back or neck feel good for several hours after the injection, the pain will likely return.  It takes 3-7 days for steroids to work in the epidural space.  You may not notice any pain relief for at least that one week.  If effective, we will often do a series of three injections spaced 3-6 weeks apart to maximally decrease your pain.  After the initial series, we generally will wait several months before considering a repeat injection of the same type.  If you have any questions, please call [phone] [place] to go get labwork done at Pre admit testing.

## 2015-09-20 LAB — C-REACTIVE PROTEIN: CRP: 0.9 mg/dL (ref <1.0)

## 2015-09-26 LAB — TOXASSURE SELECT 13 (MW), URINE: PDF: 0

## 2015-10-03 ENCOUNTER — Encounter: Payer: Self-pay | Admitting: Pain Medicine

## 2015-10-03 DIAGNOSIS — F1990 Other psychoactive substance use, unspecified, uncomplicated: Secondary | ICD-10-CM | POA: Insufficient documentation

## 2015-10-03 DIAGNOSIS — Z9114 Patient's other noncompliance with medication regimen: Secondary | ICD-10-CM | POA: Insufficient documentation

## 2015-10-03 NOTE — Progress Notes (Signed)
Quick Note:  This specimen was collected on 09/19/2015. This urine drug screen test reports the patient has taking an unreported amphetamine as well as unreported buprenorphine. These represent major violations to our medication agreement and medication policy. A search of the [place] reveals the patient to be taking alprazolam (a benzodiazepine). However, there are no prescriptions for buprenorphine, diazepam (Valium), or lorazepam (Ativan). The CDC guidelines clearly worn against the use of benzodiazepines in combination with opioids, due to the high incidence of drug to drug interactions leading to respiratory depression and death. The fact that this patient did not declare the above medications is of great concern. Based on this violation, the patient is SUD risk will be increased to high and we no longer will be prescribing 3 months at a time. Depending on the patient's honesty or lack thereof, we may decide to taper the medications down and discontinue them. ______

## 2015-10-05 NOTE — Progress Notes (Signed)

## 2015-10-05 NOTE — Progress Notes (Signed)
Quick Note:   Normal chloride levels are between 95 and 107 mEq/L. Low levels may be due to: Addison disease; Bartter syndrome; burns; congestive heart failure; dehydration; excessive sweating; hyperaldosteronism; metabolic alkalosis; respiratory acidosis (compensated); Syndrome of inappropriate diuretic hormone secretion (SIADH); or vomiting.  Results of a total protein test are usually considered along with those from other tests of the CMP and will give the healthcare practitioner information on a person's general health status with regard to nutrition and/or conditions involving major organs, such as the kidney and liver. A high total protein level may be seen with chronic inflammation or infections such as viral hepatitis or HIV. It also may be associated with bone marrow disorders such as multiple myeloma. Possible causes of high blood protein include: Bone marrow disorder Multiple myeloma Amyloidosis Monoclonal gammopathy of undetermined significance (MGUS) Chronic inflammatory conditions HIV/AIDS Dehydration (which may make blood proteins appear falsely elevated)  A high-protein diet doesn't cause high blood protein.  High blood protein is not a specific disease or condition in itself. It's usually a laboratory finding uncovered during the evaluation of a particular condition or symptom. For instance, although high blood protein is found in people who are dehydrated, the real problem is that the blood plasma is actually more concentrated.  Certain proteins in the blood may be elevated as your body fights an infection or some other inflammation. People with certain bone marrow diseases, such as multiple myeloma, may have high blood protein levels before they show any other symptoms. ______

## 2015-10-05 NOTE — Progress Notes (Signed)
Quick Note:  Lab results reviewed and found to be within normal limits. ______ 

## 2015-10-17 ENCOUNTER — Telehealth: Payer: Self-pay

## 2015-10-17 DIAGNOSIS — F411 Generalized anxiety disorder: Secondary | ICD-10-CM

## 2015-10-17 MED ORDER — ALPRAZOLAM 1 MG PO TABS: ORAL_TABLET | ORAL | Status: DC

## 2015-10-17 MED ORDER — CITALOPRAM HYDROBROMIDE 40 MG PO TABS: 40.0000 mg | ORAL_TABLET | Freq: Every day | ORAL | Status: DC

## 2015-10-17 NOTE — Telephone Encounter (Signed)
Citalopram was sent electronically, Xanax was printed for signature, then will be manually faxed.

## 2015-10-29 ENCOUNTER — Ambulatory Visit (INDEPENDENT_AMBULATORY_CARE_PROVIDER_SITE_OTHER): Payer: BLUE CROSS/BLUE SHIELD | Admitting: Podiatry

## 2015-10-29 ENCOUNTER — Encounter: Payer: Self-pay | Admitting: Sports Medicine

## 2015-10-29 DIAGNOSIS — L03039 Cellulitis of unspecified toe: Secondary | ICD-10-CM | POA: Diagnosis not present

## 2015-10-29 DIAGNOSIS — L97519 Non-pressure chronic ulcer of other part of right foot with unspecified severity: Secondary | ICD-10-CM | POA: Diagnosis not present

## 2015-10-29 DIAGNOSIS — L97529 Non-pressure chronic ulcer of other part of left foot with unspecified severity: Secondary | ICD-10-CM

## 2015-10-29 MED ORDER — AMOXICILLIN-POT CLAVULANATE 875-125 MG PO TABS: 1.0000 | ORAL_TABLET | Freq: Two times a day (BID) | ORAL | Status: DC

## 2015-10-29 MED ORDER — MUPIROCIN 2 % EX OINT: 1.0000 "application " | TOPICAL_OINTMENT | Freq: Two times a day (BID) | CUTANEOUS | Status: DC

## 2015-10-29 NOTE — Patient Instructions (Signed)
[employer] healthy feet or [employer] [place] stone

## 2015-10-31 DIAGNOSIS — L97519 Non-pressure chronic ulcer of other part of right foot with unspecified severity: Secondary | ICD-10-CM | POA: Insufficient documentation

## 2015-10-31 DIAGNOSIS — L97529 Non-pressure chronic ulcer of other part of left foot with unspecified severity: Principal | ICD-10-CM

## 2015-10-31 NOTE — Progress Notes (Signed)
Subjective:     Patient ID: Denise Webb, female   DOB: 1966/02/15, 50 y.o.   MRN: [id number]  HPI 50 year old female presents the office today for concerns of wounds to both of her big toes. She states unless has been there for "awhile" on the right says been there for about 1 week. She has noticed some redness around the areas. She states they started office blisters and calluses. She's been applying antibiotic ointment to the area. Denies any red streaks although there has been some redness around these areas. No other complaints.  Review of Systems  All other systems reviewed and are negative.      Objective:   Physical Exam General: AAO x3, NAD  Dermatological: The distal aspect of bilateral hallux or annular superficial granular wound with mild hyperkeratotic periwound. After debridement the wounds measure about one be 1 x 1 cm each and appear to be superficial that any probing, undermining or tunneling. There does appear to be faint erythema around the area without any ascending cellultiis. There is no drainage or pus. No malodor. No fluctuance or crepitus. No other open lesions are present bilaterally.  Vascular: Dorsalis Pedis artery and Posterior Tibial artery pedal pulses are 2/4 bilateral with immedate capillary fill time. Pedal hair growth present. No varicosities and no lower extremity edema present bilateral. There is no pain with calf compression, swelling, warmth, erythema.   Neruologic: Protective sensation decreased with Derrel Nip monofilament, decreased vibratory sensation.  Musculoskeletal: No gross boney pedal deformities bilateral. No pain, crepitus, or limitation noted with foot and ankle range of motion bilateral. Muscular strength 5/5 in all groups tested bilateral.  Gait: Unassisted, Nonantalgic.      Assessment:     Bilateral lateral hallux ulcerations with localized erythema    Plan:     -Treatment options discussed including all alternatives,  risks, and complications -X-rays were obtained and reviewed with the patient. No evidence of cortical destruction suggestive of cellulitis at this time. -Lesions were debrided without combination to granular tissue. Continue Bactroban dressing changes daily. -Bilateral surgical shoes were dispensed. -Discussed with her this can be limb threatening wounds. -Monitor for any clinical signs or symptoms of infection and directed to call the office immediately should any occur or go to the ER. -Augmentin -Follow-up as scheduled or sooner if any problems arise. In the meantime, encouraged to call the office with any questions, concerns, change in symptoms.   Celesta Gentile, DPM

## 2015-11-01 ENCOUNTER — Encounter: Payer: Self-pay | Admitting: Internal Medicine

## 2015-11-07 ENCOUNTER — Ambulatory Visit: Payer: BLUE CROSS/BLUE SHIELD | Attending: Pain Medicine | Admitting: Pain Medicine

## 2015-11-07 ENCOUNTER — Encounter: Payer: Self-pay | Admitting: Pain Medicine

## 2015-11-07 VITALS — BP 133/84 | HR 79 | Temp 98.4°F | Resp 16 | Ht 68.5 in | Wt 165.0 lb

## 2015-11-07 DIAGNOSIS — E1342 Other specified diabetes mellitus with diabetic polyneuropathy: Secondary | ICD-10-CM | POA: Diagnosis not present

## 2015-11-07 DIAGNOSIS — G8929 Other chronic pain: Secondary | ICD-10-CM | POA: Diagnosis not present

## 2015-11-07 DIAGNOSIS — F17200 Nicotine dependence, unspecified, uncomplicated: Secondary | ICD-10-CM | POA: Insufficient documentation

## 2015-11-07 DIAGNOSIS — M50222 Other cervical disc displacement at C5-C6 level: Secondary | ICD-10-CM | POA: Diagnosis not present

## 2015-11-07 DIAGNOSIS — F329 Major depressive disorder, single episode, unspecified: Secondary | ICD-10-CM | POA: Diagnosis not present

## 2015-11-07 DIAGNOSIS — X58XXXA Exposure to other specified factors, initial encounter: Secondary | ICD-10-CM | POA: Diagnosis not present

## 2015-11-07 DIAGNOSIS — E785 Hyperlipidemia, unspecified: Secondary | ICD-10-CM | POA: Diagnosis not present

## 2015-11-07 DIAGNOSIS — M79601 Pain in right arm: Secondary | ICD-10-CM | POA: Diagnosis not present

## 2015-11-07 DIAGNOSIS — G43909 Migraine, unspecified, not intractable, without status migrainosus: Secondary | ICD-10-CM | POA: Diagnosis not present

## 2015-11-07 DIAGNOSIS — S90829A Blister (nonthermal), unspecified foot, initial encounter: Secondary | ICD-10-CM | POA: Insufficient documentation

## 2015-11-07 DIAGNOSIS — E1165 Type 2 diabetes mellitus with hyperglycemia: Secondary | ICD-10-CM | POA: Diagnosis not present

## 2015-11-07 DIAGNOSIS — M549 Dorsalgia, unspecified: Secondary | ICD-10-CM | POA: Diagnosis present

## 2015-11-07 DIAGNOSIS — K219 Gastro-esophageal reflux disease without esophagitis: Secondary | ICD-10-CM | POA: Diagnosis not present

## 2015-11-07 DIAGNOSIS — K449 Diaphragmatic hernia without obstruction or gangrene: Secondary | ICD-10-CM | POA: Insufficient documentation

## 2015-11-07 DIAGNOSIS — M5126 Other intervertebral disc displacement, lumbar region: Secondary | ICD-10-CM

## 2015-11-07 DIAGNOSIS — G4700 Insomnia, unspecified: Secondary | ICD-10-CM | POA: Diagnosis not present

## 2015-11-07 DIAGNOSIS — M5416 Radiculopathy, lumbar region: Secondary | ICD-10-CM

## 2015-11-07 DIAGNOSIS — M47816 Spondylosis without myelopathy or radiculopathy, lumbar region: Secondary | ICD-10-CM | POA: Insufficient documentation

## 2015-11-07 DIAGNOSIS — K589 Irritable bowel syndrome without diarrhea: Secondary | ICD-10-CM | POA: Diagnosis not present

## 2015-11-07 DIAGNOSIS — E781 Pure hyperglyceridemia: Secondary | ICD-10-CM | POA: Insufficient documentation

## 2015-11-07 DIAGNOSIS — F418 Other specified anxiety disorders: Secondary | ICD-10-CM | POA: Insufficient documentation

## 2015-11-07 DIAGNOSIS — M79604 Pain in right leg: Secondary | ICD-10-CM

## 2015-11-07 DIAGNOSIS — L600 Ingrowing nail: Secondary | ICD-10-CM | POA: Insufficient documentation

## 2015-11-07 DIAGNOSIS — F1120 Opioid dependence, uncomplicated: Secondary | ICD-10-CM | POA: Diagnosis not present

## 2015-11-07 DIAGNOSIS — R202 Paresthesia of skin: Secondary | ICD-10-CM | POA: Diagnosis not present

## 2015-11-07 DIAGNOSIS — I10 Essential (primary) hypertension: Secondary | ICD-10-CM | POA: Diagnosis not present

## 2015-11-07 MED ORDER — LIDOCAINE HCL (PF) 1 % IJ SOLN
INTRAMUSCULAR | Status: AC
  Administered 2015-11-07: 10:00:00
  Filled 2015-11-07: qty 5

## 2015-11-07 MED ORDER — FENTANYL CITRATE (PF) 100 MCG/2ML IJ SOLN
INTRAMUSCULAR | Status: AC
  Administered 2015-11-07: 100 ug
  Filled 2015-11-07: qty 2

## 2015-11-07 MED ORDER — LIDOCAINE HCL (PF) 1 % IJ SOLN: 10.0000 mL | Freq: Once | INTRAMUSCULAR | Status: DC

## 2015-11-07 MED ORDER — LACTATED RINGERS IV SOLN: 1000.0000 mL | INTRAVENOUS | Status: AC

## 2015-11-07 MED ORDER — IOHEXOL 180 MG/ML  SOLN
INTRAMUSCULAR | Status: AC
  Administered 2015-11-07: 10:00:00
  Filled 2015-11-07: qty 20

## 2015-11-07 MED ORDER — MIDAZOLAM HCL 5 MG/5ML IJ SOLN
INTRAMUSCULAR | Status: AC
  Administered 2015-11-07: 10:00:00 via INTRAVENOUS
  Filled 2015-11-07: qty 5

## 2015-11-07 MED ORDER — TRIAMCINOLONE ACETONIDE 40 MG/ML IJ SUSP: 40.0000 mg | Freq: Once | INTRAMUSCULAR | Status: DC

## 2015-11-07 MED ORDER — SODIUM CHLORIDE 0.9% FLUSH: 2.0000 mL | Freq: Once | INTRAVENOUS | Status: DC

## 2015-11-07 MED ORDER — ROPIVACAINE HCL 2 MG/ML IJ SOLN: 2.0000 mL | Freq: Once | INTRAMUSCULAR | Status: DC

## 2015-11-07 MED ORDER — IOHEXOL 180 MG/ML  SOLN: 10.0000 mL | Freq: Once | INTRAMUSCULAR | Status: DC | PRN

## 2015-11-07 MED ORDER — FENTANYL CITRATE (PF) 100 MCG/2ML IJ SOLN: 100.0000 ug | INTRAMUSCULAR | Status: DC

## 2015-11-07 MED ORDER — TRIAMCINOLONE ACETONIDE 40 MG/ML IJ SUSP
INTRAMUSCULAR | Status: AC
  Administered 2015-11-07: 10:00:00
  Filled 2015-11-07: qty 1

## 2015-11-07 MED ORDER — ROPIVACAINE HCL 2 MG/ML IJ SOLN
INTRAMUSCULAR | Status: AC
  Administered 2015-11-07: 10:00:00
  Filled 2015-11-07: qty 10

## 2015-11-07 MED ORDER — MIDAZOLAM HCL 5 MG/5ML IJ SOLN: 5.0000 mg | INTRAMUSCULAR | Status: DC

## 2015-11-07 MED ORDER — SODIUM CHLORIDE 0.9 % IJ SOLN
INTRAMUSCULAR | Status: AC
  Administered 2015-11-07: 10:00:00
  Filled 2015-11-07: qty 10

## 2015-11-07 NOTE — Progress Notes (Signed)
Safety precautions to be maintained throughout the outpatient stay will include: orient to surroundings, keep bed in low position, maintain call bell within reach at all times, provide assistance with transfer out of bed and ambulation.  Patient here today for procedure

## 2015-11-07 NOTE — Patient Instructions (Signed)
Pain Management Discharge Instructions  General Discharge Instructions :  If you need to reach your doctor call: Monday-Friday 8:00 am - 4:00 pm at [phone] or toll free [phone].  After clinic hours [phone] to have operator reach doctor.  Bring all of your medication bottles to all your appointments in the pain clinic.  To cancel or reschedule your appointment with Pain Management please remember to call 24 hours in advance to avoid a fee.  Refer to the educational materials which you have been given on: General Risks, I had my Procedure. Discharge Instructions, Post Sedation.  Post Procedure Instructions:  The drugs you were given will stay in your system until tomorrow, so for the next 24 hours you should not drive, make any legal decisions or drink any alcoholic beverages.  You may eat anything you prefer, but it is better to start with liquids then soups and crackers, and gradually work up to solid foods.  Please notify your doctor immediately if you have any unusual bleeding, trouble breathing or pain that is not related to your normal pain.  Depending on the type of procedure that was done, some parts of your body may feel week and/or numb.  This usually clears up by tonight or the next day.  Walk with the use of an assistive device or accompanied by an adult for the 24 hours.  You may use ice on the affected area for the first 24 hours.  Put ice in a Ziploc bag and cover with a towel and place against area 15 minutes on 15 minutes off.  You may switch to heat after 24 hours.Epidural Steroid Injection Patient Information  Description: The epidural space surrounds the nerves as they exit the spinal cord.  In some patients, the nerves can be compressed and inflamed by a bulging disc or a tight spinal canal (spinal stenosis).  By injecting steroids into the epidural space, we can bring irritated nerves into direct contact with a potentially helpful medication.  These  steroids act directly on the irritated nerves and can reduce swelling and inflammation which often leads to decreased pain.  Epidural steroids may be injected anywhere along the spine and from the neck to the low back depending upon the location of your pain.   After numbing the skin with local anesthetic (like Novocaine), a small needle is passed into the epidural space slowly.  You may experience a sensation of pressure while this is being done.  The entire block usually last less than 10 minutes.  Conditions which may be treated by epidural steroids:   Low back and leg pain  Neck and arm pain  Spinal stenosis  Post-laminectomy syndrome  Herpes zoster (shingles) pain  Pain from compression fractures  Preparation for the injection:  1. Do not eat any solid food or dairy products within 8 hours of your appointment.  2. You may drink clear liquids up to 3 hours before appointment.  Clear liquids include water, black coffee, juice or soda.  No milk or cream please. 3. You may take your regular medication, including pain medications, with a sip of water before your appointment  Diabetics should hold regular insulin (if taken separately) and take 1/2 normal NPH dos the morning of the procedure.  Carry some sugar containing items with you to your appointment. 4. A driver must accompany you and be prepared to drive you home after your procedure.  5. Bring all your current medications with your. 6. An IV may be inserted and   sedation may be given at the discretion of the physician.   7. A blood pressure cuff, EKG and other monitors will often be applied during the procedure.  Some patients may need to have extra oxygen administered for a short period. 8. You will be asked to provide medical information, including your allergies, prior to the procedure.  We must know immediately if you are taking blood thinners (like Coumadin/Warfarin)  Or if you are allergic to IV iodine contrast (dye). We must  know if you could possible be pregnant.  Possible side-effects:  Bleeding from needle site  Infection (rare, may require surgery)  Nerve injury (rare)  Numbness & tingling (temporary)  Difficulty urinating (rare, temporary)  Spinal headache ( a headache worse with upright posture)  Light -headedness (temporary)  Pain at injection site (several days)  Decreased blood pressure (temporary)  Weakness in arm/leg (temporary)  Pressure sensation in back/neck (temporary)  Call if you experience:  Fever/chills associated with headache or increased back/neck pain.  Headache worsened by an upright position.  New onset weakness or numbness of an extremity below the injection site  Hives or difficulty breathing (go to the emergency room)  Inflammation or drainage at the infection site  Severe back/neck pain  Any new symptoms which are concerning to you  Please note:  Although the local anesthetic injected can often make your back or neck feel good for several hours after the injection, the pain will likely return.  It takes 3-7 days for steroids to work in the epidural space.  You may not notice any pain relief for at least that one week.  If effective, we will often do a series of three injections spaced 3-6 weeks apart to maximally decrease your pain.  After the initial series, we generally will wait several months before considering a repeat injection of the same type.  If you have any questions, please call [phone] [facility] 

## 2015-11-07 NOTE — Progress Notes (Signed)
Patient's Name: Denise Webb MRN: [id number] DOB: [birth year] DOS: 11/07/2015  Primary Reason(s) for Visit: Interventional Pain Management Treatment. CC: Back Pain   Pre-Procedure Assessment:  Denise Webb is a 50 y.o. year old, female patient, seen today for interventional treatment. She has HYPERTRIGLYCERIDEMIA; ANXIETY DEPRESSION; Tobacco abuse; Essential hypertension, benign; Irritable bowel syndrome; Neuropathic pain; DM type 2, uncontrolled, with neuropathy ([place]); Other and unspecified hyperlipidemia; Polyneuropathic pain; Encounter for therapeutic drug monitoring; Blister of foot without infection; Depression; Ingrown toenail; Hyperlipidemia LDL goal <100; Insomnia; Abnormality of gait; Paresthesia; Generalized anxiety disorder; 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[place]); Chronic pain; Long term current use of opiate analgesic; Long term prescription opiate use; Opiate use; Encounter for therapeutic drug level monitoring; Chronic low back pain (Location of Tertiary source of pain) (Bilateral) (R>L); Lumbar facet syndrome (Bilateral) (R>L); Diabetic peripheral neuropathy (HCC) (Location of Primary Source of Pain) (Bilateral) (Feet) (L>R); Opiate dependence ([place]); History of migraine; History of head injury; Sleep apnea; GERD (gastroesophageal reflux disease); Hiatal hernia; Lumbar facet arthropathy (L2-3, L3-4, L4-5, and L5-S1); Lumbar disc herniation (L5-S1); Cervical disc bulge (Left) (C5-6); Chronic neck pain; Cervical spondylosis; Lumbar spondylosis; Nicotine dependence; Chronic lower extremity pain (Location of Primary Source of Pain) (Bilateral) (L>R); Chronic lumbar radicular pain (Bilateral) (R>L) (Right L5 Dermatome); Osteoarthritis; Lumbar radiculopathy (Bilateral) (EMG performed on 06/18/2014); History of negative lower extremity EMG; Abnormal MRI, lumbar spine; Substance use disorder (abnormal UDS on 09/19/2015); Pain medication agreement broken (see 09/19/2015  UDS); and Bilateral great toes ulcers ([place]) on her problem list.. Her primarily concern today is the Back Pain   Today's Initial Pain Score: 7/10 Reported level of pain is incompatible with clinical obrservations. This may be secondary to a possible lack of understanding on how the pain scale works. Pain Type: Chronic pain Pain Location: Back Pain Orientation: Lower Pain Descriptors / Indicators: Aching, Burning, Sharp, Shooting Pain Frequency: Constant  Post-procedure Pain Score: 4   Date of Last Visit: 09/19/15 Service Provided on Last Visit: Med Refill  Verification of the correct person, correct site (including marking of site), and correct procedure were performed and confirmed by the patient.  Today's Vitals   11/07/15 1028 11/07/15 1030 11/07/15 1042 11/07/15 1050  BP: 131/90 135/91 129/81 133/84  Pulse: 86 85 83 79  Temp: 98.4 F (36.9 C)   98.4 F (36.9 C)  Resp: _0 Height:      Weight:      SpO2: 97% 97% 96% 98%  PainSc: _1 Calculated BMI: Body mass index is 24.72 kg/(m^2). Allergies: She is allergic to sulfur; erythromycin stearate; and morphine and related.. Primary Diagnosis: Bilateral lumbar radiculopathy [M54.16]  Procedure:  Type: Diagnostic Inter-Laminar Epidural Steroid Injection Region: Lumbar Level: L5-S1 Level. Laterality: Right Paramedial  Indications: 1. Lumbar radiculopathy (Bilateral) (EMG performed on 06/18/2014)   2. Lumbar disc herniation (L5-S1)   3. Chronic lumbar radicular pain (Bilateral) (R>L) (Right L5 Dermatome)   4. Chronic pain of lower extremity, right     In addition, Ms. Barbour-Swain has Neuropathic pain; Polyneuropathic pain; Paresthesia; Chronic pain; Chronic low back pain (Location of Tertiary source of pain) (Bilateral) (R>L); Lumbar facet syndrome (Bilateral) (R>L); Diabetic peripheral neuropathy (HCC) (Location of Primary Source of Pain) (Bilateral) (Feet) (L>R); Lumbar facet arthropathy (L2-3, L3-4, L4-5,  and L5-S1); Lumbar disc herniation (L5-S1); Cervical disc bulge (Left) (C5-6); Chronic neck pain; Cervical spondylosis; Lumbar spondylosis; Chronic lower extremity pain (Location of Primary Source of Pain) (Bilateral) (L>R);  Chronic lumbar radicular pain (Bilateral) (R>L) (Right L5 Dermatome); Osteoarthritis; Lumbar radiculopathy (Bilateral) (EMG performed on 06/18/2014); History of negative lower extremity EMG; and Abnormal MRI, lumbar spine on her pertinent problem list.  Consent: Secured. Under the influence of no sedatives a written informed consent was obtained, after having provided information on the risks and possible complications. To fulfill our ethical and legal obligations, as recommended by the American Medical Association's Code of Ethics, we have provided information to the patient about our clinical impression; the nature and purpose of the treatment or procedure; the risks, benefits, and possible complications of the intervention; alternatives; the risk(s) and benefit(s) of the alternative treatment(s) or procedure(s); and the risk(s) and benefit(s) of doing nothing. The patient was provided information about the risks and possible complications associated with the procedure. In the case of spinal procedures these may include, but are not limited to, failure to achieve desired goals, infection, bleeding, organ or nerve damage, allergic reactions, paralysis, and death. In addition, the patient was informed that Medicine is not an exact science; therefore, there is also the possibility of unforeseen risks and possible complications that may result in a catastrophic outcome. The patient indicated having understood very clearly. We have given the patient no guarantees and we have made no promises. Enough time was given to the patient to ask questions, all of which were answered to the patient's satisfaction.  Pre-Procedure Preparation: Safety Precautions: Allergies reviewed. Appropriate site,  procedure, and patient were confirmed by following the Joint Commission's Universal Protocol (UP.01.01.01), in the form of a "Time Out". The patient was asked to confirm marked site and procedure, before commencing. The patient was asked about blood thinners, or active infections, both of which were denied. Patient was assessed for positional comfort and all pressure points were checked before starting procedure. Monitoring:  As per clinic protocol. Infection Control Precautions: Sterile technique used. Standard Universal Precautions were taken as recommended by the Department of [facility] for Disease Control and Prevention (CDC). Standard pre-surgical skin prep was conducted. Respiratory hygiene and cough etiquette was practiced. Hand hygiene observed. Safe injection practices and needle disposal techniques followed. SDV (single dose vial) medications used. Medications properly checked for expiration dates and contaminants. Personal protective equipment (PPE) used: Surgical mask. Sterile double glove technique. Radiation resistant gloves. Sterile surgical gloves.  Anesthesia, Analgesia, Anxiolysis: Type: Moderate (Conscious) Sedation & Local Anesthesia Local Anesthetic: Lidocaine 1% Route: Intravenous (IV) IV Access: Secured Sedation: Meaningful verbal contact was maintained at all times during the procedure  Indication(s): Analgesia & Anxiolysis  Description of Procedure Process:  Time-out: "Time-out" completed before starting procedure, as per protocol. Position: Prone Target Area: For Epidural Steroid injections, the target area is the  interlaminar space, initially targeting the lower border of the superior vertebral body lamina. Approach: Posterior approach. Area Prepped: Entire Posterior Lumbosacral Region Prepping solution: ChloraPrep (2% chlorhexidine gluconate and 70% isopropyl alcohol) Safety Precautions: Aspiration looking for blood return was conducted prior to all  injections. At no point did we inject any substances, as a needle was being advanced. No attempts were made at seeking any paresthesias. Safe injection practices and needle disposal techniques used. Medications properly checked for expiration dates. SDV (single dose vial) medications used.   Description of the Procedure: Protocol guidelines were followed. The patient was placed in position over the fluoroscopy table. The target area was identified and the area prepped in the usual manner. Skin desensitized using vapocoolant spray. Skin & deeper tissues infiltrated with local anesthetic. Appropriate  amount of time allowed to pass for local anesthetics to take effect. The procedure needle was introduced through the skin, ipsilateral to the reported pain, and advanced to the target area. Bone was contacted and the needle walked caudad, until the lamina was cleared. The epidural space was identified using "loss-of-resistance technique" with 2-3 ml of PF-NaCl (0.9% NSS), in a 5cc LOR glass syringe. Proper needle placement secured. Negative aspiration confirmed. Solution injected in intermittent fashion, asking for systemic symptoms every 0.5cc of injectate. The needles were then removed and the area cleansed, making sure to leave some of the prepping solution back to take advantage of its long term bactericidal properties. EBL: None Materials & Medications Used:  Needle(s) Used: 20g - 10cm, Tuohy-style epidural needle Medication(s): Please see chart orders for medication and dosing details.  Imaging Guidance:  Type of Imaging Technique: Fluoroscopy Guidance (Spinal) Indication(s): Assistance in needle guidance and placement for procedures requiring needle placement in or near specific anatomical locations not easily accessible without such assistance. Exposure Time: Please see nurses notes. Contrast: Before injecting any contrast, we confirmed that the patient did not have an allergy to iodine, shellfish, or  radiological contrast. Once satisfactory needle placement was completed at the desired level, radiological contrast was injected. Injection was conducted under continuous fluoroscopic guidance. Injection of contrast accomplished without complications. See chart for type and volume of contrast used. Fluoroscopic Guidance: I was personally present in the fluoroscopy suite, where the patient was placed in position for the procedure, over the fluoroscopy-compatible table. Fluoroscopy was manipulated, using "Tunnel Vision Technique", to obtain the best possible view of the target area, on the affected side. Parallax error was corrected before commencing the procedure. A "direction-depth-direction" technique was used to introduce the needle under continuous pulsed fluoroscopic guidance. Once the target was reached, antero-posterior, oblique, and lateral fluoroscopic projection views were taken to confirm needle placement in all planes. Permanently recorded images stored by scanning into EMR. Interpretation: Intraoperative imaging interpretation by performing Physician. Adequate needle placement confirmed. Adequate needle placement confirmed in AP, lateral, & Oblique Views. Appropriate spread of contrast to desired area. No evidence of afferent or efferent intravascular uptake. No intrathecal or subarachnoid spread observed. Permanent hardcopy images in multiple planes scanned into the patient's record.  Antibiotic Prophylaxis:  Indication(s): No indications identified. Type:  Antibiotics Given (last 72 hours)    None       Post-operative Assessment:  Complications: No immediate post-treatment complications were observed. Relevant Post-operative Information:  Disposition: Return to clinic for follow-up evaluation. The patient tolerated the entire procedure well. A repeat set of vitals were taken after the procedure and the patient was kept under observation following institutional policy, for this procedure.  Post-procedural neurological assessment was performed, showing return to baseline, prior to discharge. The patient was discharged home, once institutional criteria were met. The patient was provided with post-procedure discharge instructions, including a section on how to identify potential problems. Should any problems arise concerning this procedure, the patient was given instructions to immediately contact us, at any time, without hesitation. In any case, we plan to contact the patient by telephone for a follow-up status report regarding this interventional procedure. Comments:  No additional relevant information.  Medications administered during this visit: We administered lidocaine (PF), sodium chloride, ropivacaine (PF) 2 mg/ml (0.2%), fentaNYL, triamcinolone acetonide, midazolam, and iohexol.  Prescriptions ordered during this visit: New Prescriptions   No medications on file    Future Appointments Date Time Provider [place]  11/14/2015 10:45 AM  Trula Slade, DPM [facility]  11/29/2015 2:15 PM Gildardo Cranker, DO [facility] None  12/23/2015 10:20 AM Milinda Pointer, MD [facility] None    Primary Care Physician: Gildardo Cranker, DO Location: [facility] Note by: Kathlen Brunswick. Dossie Arbour, M.D, DABA, DABAPM, DABPM, DABIPP, FIPP  Disclaimer:  Medicine is not an exact science. The only guarantee in medicine is that nothing is guaranteed. It is important to note that the decision to proceed with this intervention was based on the information collected from the patient. The Data and conclusions were drawn from the patient's questionnaire, the interview, and the physical examination. Because the information was provided in large part by the patient, it cannot be guaranteed that it has not been purposely or unconsciously manipulated. Every effort has been made to obtain as much relevant data as possible for this evaluation. It is important to note that  the conclusions that lead to this procedure are derived in large part from the available data. Always take into account that the treatment will also be dependent on availability of resources and existing treatment guidelines, considered by other Pain Management Practitioners as being common knowledge and practice, at the time of the intervention. For Medico-Legal purposes, it is also important to point out that variation in procedural techniques and pharmacological choices are the acceptable norm. The indications, contraindications, technique, and results of the above procedure should only be interpreted and judged by a Board-Certified Interventional Pain Specialist with extensive familiarity and expertise in the same exact procedure and technique. Attempts at providing opinions without similar or greater experience and expertise than that of the treating physician will be considered as inappropriate and unethical, and shall result in a formal complaint to the state medical board and applicable specialty societies.

## 2015-11-08 ENCOUNTER — Telehealth: Payer: Self-pay | Admitting: *Deleted

## 2015-11-08 NOTE — Telephone Encounter (Signed)
Left voicemail with patient to call our office if there are any questions or concerns re; procedure on yesterday.

## 2015-11-11 ENCOUNTER — Other Ambulatory Visit: Payer: Self-pay | Admitting: *Deleted

## 2015-11-11 DIAGNOSIS — F411 Generalized anxiety disorder: Secondary | ICD-10-CM

## 2015-11-11 MED ORDER — ALPRAZOLAM 1 MG PO TABS: ORAL_TABLET | ORAL | Status: DC

## 2015-11-11 NOTE — Telephone Encounter (Signed)
Patient requested refill. Printed and faxed to [facility]

## 2015-11-14 ENCOUNTER — Ambulatory Visit: Payer: BLUE CROSS/BLUE SHIELD | Admitting: Podiatry

## 2015-11-15 ENCOUNTER — Ambulatory Visit: Payer: Self-pay | Admitting: Internal Medicine

## 2015-11-22 ENCOUNTER — Telehealth: Payer: Self-pay | Admitting: *Deleted

## 2015-11-22 MED ORDER — FLUCONAZOLE 150 MG PO TABS: 150.0000 mg | ORAL_TABLET | Freq: Once | ORAL | Status: DC

## 2015-11-22 NOTE — Telephone Encounter (Signed)
Patient taking an antibiotic and now has thrush. Please advise

## 2015-11-29 ENCOUNTER — Ambulatory Visit: Payer: Self-pay | Admitting: Internal Medicine

## 2015-12-09 ENCOUNTER — Other Ambulatory Visit: Payer: Self-pay | Admitting: *Deleted

## 2015-12-09 DIAGNOSIS — F411 Generalized anxiety disorder: Secondary | ICD-10-CM

## 2015-12-09 MED ORDER — ALPRAZOLAM 1 MG PO TABS: ORAL_TABLET | ORAL | Status: DC

## 2015-12-09 NOTE — Telephone Encounter (Signed)
Patient called and requested refill to be faxed to pharmacy. Printed and faxed.

## 2015-12-14 ENCOUNTER — Other Ambulatory Visit: Payer: Self-pay | Admitting: Pain Medicine

## 2015-12-16 ENCOUNTER — Telehealth: Payer: Self-pay | Admitting: *Deleted

## 2015-12-16 NOTE — Telephone Encounter (Signed)
Patient requesting refill on silvadene cream

## 2015-12-16 NOTE — Telephone Encounter (Signed)
Left message requesting a callback.  I reviewed pt's medication orders and we have not prescribed Silvadene possibly due to the allergy to sulfur, but have prescribed Bactroban, and I would like to see what pt is wanting refilled.

## 2015-12-19 ENCOUNTER — Telehealth: Payer: Self-pay | Admitting: *Deleted

## 2015-12-19 NOTE — Telephone Encounter (Signed)
Pt returned my call of 12/16/2015, left message to call her again.  I left message on pt's requested phone, that we had not seen her since 10/29/2015 and if she was continuing to have a problem or a new problem she should make an appt.

## 2015-12-20 ENCOUNTER — Telehealth: Payer: Self-pay

## 2015-12-20 NOTE — Telephone Encounter (Signed)
Request was received from [facility] for Mometasone Furoate Monohydrate Nasal Spray 120 Units (17gm).  I contacted patient to confirm request, patient did request medication from this company. Fax was given to Dr. Eulas Post along with last OV note.

## 2015-12-23 ENCOUNTER — Encounter: Payer: Self-pay | Admitting: Pain Medicine

## 2015-12-23 ENCOUNTER — Ambulatory Visit: Payer: BLUE CROSS/BLUE SHIELD | Attending: Pain Medicine | Admitting: Pain Medicine

## 2015-12-23 ENCOUNTER — Other Ambulatory Visit: Payer: Self-pay | Admitting: Internal Medicine

## 2015-12-23 VITALS — BP 132/82 | HR 81 | Temp 98.0°F | Resp 16 | Wt 164.0 lb

## 2015-12-23 DIAGNOSIS — M50222 Other cervical disc displacement at C5-C6 level: Secondary | ICD-10-CM | POA: Insufficient documentation

## 2015-12-23 DIAGNOSIS — E1165 Type 2 diabetes mellitus with hyperglycemia: Secondary | ICD-10-CM | POA: Diagnosis not present

## 2015-12-23 DIAGNOSIS — E785 Hyperlipidemia, unspecified: Secondary | ICD-10-CM | POA: Diagnosis not present

## 2015-12-23 DIAGNOSIS — L600 Ingrowing nail: Secondary | ICD-10-CM | POA: Diagnosis not present

## 2015-12-23 DIAGNOSIS — R1031 Right lower quadrant pain: Secondary | ICD-10-CM | POA: Diagnosis not present

## 2015-12-23 DIAGNOSIS — K529 Noninfective gastroenteritis and colitis, unspecified: Secondary | ICD-10-CM | POA: Diagnosis not present

## 2015-12-23 DIAGNOSIS — M549 Dorsalgia, unspecified: Secondary | ICD-10-CM | POA: Diagnosis present

## 2015-12-23 DIAGNOSIS — X58XXXA Exposure to other specified factors, initial encounter: Secondary | ICD-10-CM | POA: Insufficient documentation

## 2015-12-23 DIAGNOSIS — M5416 Radiculopathy, lumbar region: Secondary | ICD-10-CM

## 2015-12-23 DIAGNOSIS — D252 Subserosal leiomyoma of uterus: Secondary | ICD-10-CM | POA: Diagnosis not present

## 2015-12-23 DIAGNOSIS — M79605 Pain in left leg: Secondary | ICD-10-CM

## 2015-12-23 DIAGNOSIS — M26609 Unspecified temporomandibular joint disorder, unspecified side: Secondary | ICD-10-CM | POA: Diagnosis not present

## 2015-12-23 DIAGNOSIS — L97519 Non-pressure chronic ulcer of other part of right foot with unspecified severity: Secondary | ICD-10-CM | POA: Diagnosis not present

## 2015-12-23 DIAGNOSIS — I10 Essential (primary) hypertension: Secondary | ICD-10-CM | POA: Insufficient documentation

## 2015-12-23 DIAGNOSIS — M79602 Pain in left arm: Secondary | ICD-10-CM

## 2015-12-23 DIAGNOSIS — M47812 Spondylosis without myelopathy or radiculopathy, cervical region: Secondary | ICD-10-CM | POA: Insufficient documentation

## 2015-12-23 DIAGNOSIS — S90829A Blister (nonthermal), unspecified foot, initial encounter: Secondary | ICD-10-CM | POA: Insufficient documentation

## 2015-12-23 DIAGNOSIS — M722 Plantar fascial fibromatosis: Secondary | ICD-10-CM | POA: Insufficient documentation

## 2015-12-23 DIAGNOSIS — G8929 Other chronic pain: Secondary | ICD-10-CM | POA: Insufficient documentation

## 2015-12-23 DIAGNOSIS — F419 Anxiety disorder, unspecified: Secondary | ICD-10-CM | POA: Diagnosis not present

## 2015-12-23 DIAGNOSIS — M4726 Other spondylosis with radiculopathy, lumbar region: Secondary | ICD-10-CM | POA: Insufficient documentation

## 2015-12-23 DIAGNOSIS — L97529 Non-pressure chronic ulcer of other part of left foot with unspecified severity: Secondary | ICD-10-CM | POA: Diagnosis not present

## 2015-12-23 DIAGNOSIS — M79673 Pain in unspecified foot: Secondary | ICD-10-CM | POA: Diagnosis present

## 2015-12-23 DIAGNOSIS — F17210 Nicotine dependence, cigarettes, uncomplicated: Secondary | ICD-10-CM | POA: Diagnosis not present

## 2015-12-23 DIAGNOSIS — N281 Cyst of kidney, acquired: Secondary | ICD-10-CM | POA: Insufficient documentation

## 2015-12-23 DIAGNOSIS — E1142 Type 2 diabetes mellitus with diabetic polyneuropathy: Secondary | ICD-10-CM | POA: Insufficient documentation

## 2015-12-23 DIAGNOSIS — M545 Low back pain: Secondary | ICD-10-CM | POA: Insufficient documentation

## 2015-12-23 DIAGNOSIS — R202 Paresthesia of skin: Secondary | ICD-10-CM | POA: Diagnosis not present

## 2015-12-23 DIAGNOSIS — K449 Diaphragmatic hernia without obstruction or gangrene: Secondary | ICD-10-CM | POA: Diagnosis not present

## 2015-12-23 DIAGNOSIS — G43909 Migraine, unspecified, not intractable, without status migrainosus: Secondary | ICD-10-CM | POA: Diagnosis not present

## 2015-12-23 DIAGNOSIS — S2220XA Unspecified fracture of sternum, initial encounter for closed fracture: Secondary | ICD-10-CM

## 2015-12-23 DIAGNOSIS — G4730 Sleep apnea, unspecified: Secondary | ICD-10-CM | POA: Insufficient documentation

## 2015-12-23 DIAGNOSIS — G4700 Insomnia, unspecified: Secondary | ICD-10-CM | POA: Diagnosis not present

## 2015-12-23 DIAGNOSIS — K219 Gastro-esophageal reflux disease without esophagitis: Secondary | ICD-10-CM | POA: Insufficient documentation

## 2015-12-23 DIAGNOSIS — M5126 Other intervertebral disc displacement, lumbar region: Secondary | ICD-10-CM | POA: Diagnosis not present

## 2015-12-23 DIAGNOSIS — R269 Unspecified abnormalities of gait and mobility: Secondary | ICD-10-CM | POA: Insufficient documentation

## 2015-12-23 DIAGNOSIS — K589 Irritable bowel syndrome without diarrhea: Secondary | ICD-10-CM | POA: Diagnosis not present

## 2015-12-23 DIAGNOSIS — M79606 Pain in leg, unspecified: Secondary | ICD-10-CM | POA: Diagnosis present

## 2015-12-23 DIAGNOSIS — F1190 Opioid use, unspecified, uncomplicated: Secondary | ICD-10-CM

## 2015-12-23 DIAGNOSIS — D62 Acute posthemorrhagic anemia: Secondary | ICD-10-CM

## 2015-12-23 HISTORY — DX: Unspecified fracture of sternum, initial encounter for closed fracture: S22.20XA

## 2015-12-23 HISTORY — DX: Acute posthemorrhagic anemia: D62

## 2015-12-23 MED ORDER — GABAPENTIN 600 MG PO TABS: 600.0000 mg | ORAL_TABLET | Freq: Four times a day (QID) | ORAL | Status: DC

## 2015-12-23 MED ORDER — OXYCODONE HCL 10 MG PO TABS: 20.0000 mg | ORAL_TABLET | Freq: Every day | ORAL | Status: DC | PRN

## 2015-12-23 MED ORDER — GABAPENTIN 300 MG PO CAPS: 300.0000 mg | ORAL_CAPSULE | Freq: Four times a day (QID) | ORAL | Status: DC

## 2015-12-23 NOTE — Progress Notes (Signed)
Patient's Name: Denise Webb  Patient type: Established  MRN: [id number]  Service setting: Ambulatory outpatient  DOB: August 05, 1966  Location: [facility]  DOS: 12/23/2015  Primary Care Physician: Gildardo Cranker, DO  Note by: Kathlen Brunswick. Dossie Arbour, M.D, DABA, DABAPM, DABPM, Milagros Evener, [place]  Referring Physician: Gildardo Cranker, DO  Specialty: Board-Certified Interventional Pain Management  Last Visit to Pain Management: 12/14/2015   Primary Reason(s) for Visit: Encounter for post-procedure evaluation of chronic illness with mild to moderate exacerbation CC: Back Pain; Foot Pain; and Leg Pain   HPI  Ms. Sangha is a 50 y.o. year old, female patient, who returns today as an established patient. She has HYPERTRIGLYCERIDEMIA; ANXIETY DEPRESSION; Tobacco abuse; Essential hypertension, benign; Irritable bowel syndrome; Neuropathic pain; DM type 2, uncontrolled, with neuropathy ([place]); Other and unspecified hyperlipidemia; Polyneuropathic pain; Encounter for therapeutic drug monitoring; Blister of foot without infection; Depression; Ingrown toenail; Hyperlipidemia LDL goal <100; Insomnia; Abnormality of gait; Paresthesia; Generalized anxiety disorder; 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[place]); Chronic pain; Long term current use of opiate analgesic; Long term prescription opiate use; Opiate use (150 MME/Day); Encounter for therapeutic drug level monitoring; Chronic low back pain (Location of Tertiary source of pain) (Bilateral) (R>L); Lumbar facet syndrome (Bilateral) (R>L); Diabetic peripheral neuropathy (HCC) (Location of Primary Source of Pain) (Bilateral) (Feet) (L>R); Opiate dependence ([place]); History of migraine; History of head injury; Sleep apnea; GERD (gastroesophageal reflux disease); Hiatal hernia; Lumbar facet arthropathy (L2-3, L3-4, L4-5, and L5-S1); Lumbar disc herniation (L5-S1); Cervical disc bulge (Left) (C5-6); Chronic neck pain; Cervical  spondylosis; Lumbar spondylosis; Nicotine dependence; Chronic lower extremity pain (Location of Primary Source of Pain) (Bilateral) (L>R); Chronic lumbar radicular pain (Bilateral) (R>L) (Right L5 Dermatome); Osteoarthritis; Lumbar radiculopathy (Bilateral) (EMG performed on 06/18/2014); History of negative lower extremity EMG; Abnormal MRI, lumbar spine; Substance use disorder (abnormal UDS on 09/19/2015); Pain medication agreement broken (see 09/19/2015 UDS); and Bilateral great toes ulcers ([place]) on her problem list.. Her primarily concern today is the Back Pain; Foot Pain; and Leg Pain   Pain Assessment: Self-Reported Pain Score: 4  Reported level is compatible with observation Pain Type: Chronic pain Pain Location: Back Pain Orientation: Lower Pain Descriptors / Indicators: Aching, Constant, Throbbing, Radiating, Pressure Pain Frequency: Constant  The patient returns after having had a right sided L5-S1 lumbar epidural steroid injection under fluoroscopic guidance and IV sedation. Unfortunately, the patient's last UDS was positive for buprenorphine which is a violation to our medication agreement and therefore today we will start the process of discontinuing this patient's opioids. We no longer will write for 3 months and we will begin to go down on the amount that she gets by 1 pill so as to slowly taper her off of it.  Date of Last Visit: 11/07/15 Service Provided on Last Visit: Procedure (lumbar epidural)  Post-Procedure Assessment  Procedure done on last visit: Right-sided L5-S1 lumbar epidural steroid injection under fluoroscopic guidance and IV sedation. Side-effects or Adverse reactions: None reported Sedation: Sedation given  Results: Ultra-Short Term Relief (First 1 hour after procedure): 100 %  Analgesia during this period is likely to be Local Anesthetic and/or IV Sedative (Analgesic/Anxiolitic) related Short Term Relief (Initial 4-6 hrs after procedure): 100 % Complete relief  confirms area to be the source of pain Long Term Relief : 30 % (lasted 3 days for 30 %) Long-term benefit would suggest an inflammatory etiology to the pain   Current Relief (Now): 0%  No persistent benefit would suggest no long-term  anti-inflammatory benefit from intervention and/or persistent or recurrent aggravating factors Interpretation of Results: Although this is providing the patient with excellent relief of her pain for the duration of local anesthetic, it is not doing anything in terms of providing her with long-term benefits. Because of this, at this point we will hold on any further injections and we will get a neurosurgical consult.  Controlled Substance Pharmacotherapy Assessment & REMS (Risk Evaluation and Mitigation Strategy)  Analgesic: Oxycodone IR 10 mg 2 tablets 5 times a day (100 mg/day) Pill Count: Pill count is 0/180 oxycodone hcl 10 mg tablets filled on 11/16/2015. MME/day: 150 mg/day.  Pharmacokinetics: Onset of action (Liberation/Absorption): Within expected pharmacological parameters Time to Peak effect (Distribution): Timing and results are as within normal expected parameters Duration of action (Metabolism/Excretion): Within normal limits for medication Pharmacodynamics: Analgesic Effect: More than 50% Activity Facilitation: Medication(s) allow patient to sit, stand, walk, and do the basic ADLs Perceived Effectiveness: Described as relatively effective, allowing for increase in activities of daily living (ADL) Side-effects or Adverse reactions: None reported Monitoring: [place] PMP: Online review of the past 42-month period conducted. Compliant with practice rules and regulations UDS Results/interpretation: The patient's last UDS was done on 09/19/2015 and it came back abnormal due to the presence of amphetamines, benzodiazepines, and buprenorphine. Patient apparently had not reported any of these. Today we confronted the patient with regards to the use of the day  buprenorphine since I did not find any in her PMP report. This would suggest that this medication was obtained illegally. Because of this, I will not longer continue prescribing the opioids for her. The plan is to slowly taper her down until we can stop it was about throwing her into severe withdrawals. Medication Assessment Form: Reviewed. Patient indicates being compliant with therapy Treatment compliance: Compliant Risk Assessment: Aberrant Behavior: Obtaining medications from illicit sources Opioid Fatal Overdose Risk Factors: Concomitant use of Benzodiazepines, Sleep Apnea, Age 64-35 years old and High daily dosage Non-fatal overdose hazard ratio (HR): 8.87 for 100-199 MME/day Fatal overdose hazard ratio (HR): 2.04 for doses equal to, or higher than 100 MME/day Substance Use Disorder (SUD) Risk Level: High Opioid Risk Tool (ORT) Score: Total Score: 2 Low Risk for SUD (Score <3) Depression Scale Score: PHQ-2: PHQ-2 Total Score: 0 No depression (0) PHQ-9: PHQ-9 Total Score: 0 No depression (0-4) Risk Mitigation Strategies:  Patient Counseling Document (PCD):  Patient-Prescriber Agreement (PPA):   Pharmacologic Plan: No change in therapy, at this time  Laboratory Chemistry  Inflammation Markers Lab Results  Component Value Date   ESRSEDRATE 31* 09/19/2015   CRP 0.9 09/19/2015    Renal Function Lab Results  Component Value Date   BUN 12 09/19/2015   CREATININE 0.63 09/19/2015   GFRAA >60 09/19/2015   GFRNONAA >60 09/19/2015    Hepatic Function Lab Results  Component Value Date   AST 24 09/19/2015   ALT 18 09/19/2015   ALBUMIN 4.9 09/19/2015    Electrolytes Lab Results  Component Value Date   NA 139 09/19/2015   K 3.6 09/19/2015   CL 99* 09/19/2015   CALCIUM 9.8 09/19/2015   MG 2.2 09/19/2015    Pain Modulating Vitamins No results found for: [place], VD125OH2TOT, [id number], [id number], VITAMINB12  Coagulation Parameters No results found for: INR,  LABPROT  Note: I personally reviewed the above data. Results made available to patient.  Recent Diagnostic Imaging  Ct Abdomen Pelvis W Contrast  06/27/2014  CLINICAL DATA:  50 year old female with history of 2-3 months  of right lower quadrant abdominal pain. EXAM: CT ABDOMEN AND PELVIS WITH CONTRAST TECHNIQUE: Multidetector CT imaging of the abdomen and pelvis was performed using the standard protocol following bolus administration of intravenous contrast. CONTRAST:  180mL OMNIPAQUE IOHEXOL 300 MG/ML  SOLN COMPARISON:  CT of the abdomen and pelvis 11/14/2010. FINDINGS: Lower chest:  Unremarkable. Hepatobiliary: No focal cystic or solid hepatic lesions. No intra or extrahepatic biliary ductal dilatation. Gallbladder is normal in appearance. Pancreas: Unremarkable. Spleen: Unremarkable. Adrenals/Urinary Tract: Bilateral adrenal glands and the right kidney are normal in appearance. 4.2 cm exophytic low-attenuation nonenhancing lesion in the lower pole of the left kidney, compatible with a simple cyst. No hydroureteronephrosis. Urinary bladder is normal in appearance. Stomach/Bowel: Normal appearance of the stomach. No pathologic dilatation of small bowel or colon. Normal appendix. Vascular/Lymphatic: Atherosclerosis throughout the abdominal and pelvic vasculature, without evidence of aneurysm or dissection. Circumaortic left renal vein (normal anatomical variant) incidentally noted. No pathologically enlarged lymph nodes noted in the abdomen or pelvis. Reproductive: 16 mm exophytic lesion in the fundus of the uterus is compatible with a small sub serosal fibroid. Ovaries are unremarkable in appearance. Other: No significant volume of ascites.  No pneumoperitoneum. Musculoskeletal: There are no aggressive appearing lytic or blastic lesions noted in the visualized portions of the skeleton. IMPRESSION: 1. No acute findings to account for the patient's symptoms. 2. Normal appendix. 3. 4.2 cm simple cyst in the lower  pole of the left kidney. 4. Additional incidental findings, as above. Electronically Signed   By: Vinnie Langton M.D.   On: 06/27/2014 16:22    Meds  The patient has a current medication list which includes the following prescription(s): alprazolam, aspirin-acetaminophen-caffeine, atorvastatin, calcium acetate (phos binder), citalopram, fluconazole, gabapentin, gabapentin, glucose blood, hydrochlorothiazide, acidophilus probiotic, loperamide, metformin, mupirocin ointment, neomycin-bacitracin-polymyxin, omeprazole, oxycodone hcl, promethazine, triamcinolone, and atenolol.  Current Outpatient Prescriptions on File Prior to Visit  Medication Sig  . ALPRAZolam (XANAX) 1 MG tablet Take one tablet by mouth three times daily as needed for anxiety  . aspirin-acetaminophen-caffeine (EXCEDRIN MIGRAINE) 250-250-65 MG per tablet Take by mouth every 6 (six) hours as needed for headache.  Marland Kitchen atorvastatin (LIPITOR) 10 MG tablet TAKE 1 TABLET (10 MG TOTAL) BY MOUTH DAILY.  . Calcium Acetate, Phos Binder, (CALCIUM ACETATE PO) Take by mouth. By mouth every other day.  . citalopram (CELEXA) 40 MG tablet Take 1 tablet (40 mg total) by mouth daily.  . fluconazole (DIFLUCAN) 150 MG tablet Take 1 tablet (150 mg total) by mouth once.  Marland Kitchen glucose blood test strip 1 each by Other route as needed for other. Reli on prime: check blood sugar twice daily DX: 250.62  . hydrochlorothiazide (HYDRODIURIL) 25 MG tablet TAKE 1 TABLET (25 MG TOTAL) BY MOUTH DAILY.  . Lactobacillus (ACIDOPHILUS PROBIOTIC) 100 MG CAPS Take 1 capsule (100 mg total) by mouth daily.  Marland Kitchen loperamide (IMODIUM A-D) 2 MG tablet Take 2 mg by mouth 3 (three) times daily as needed. Take 2 tabs by mouth once daily as needed for diarrhea.  . metFORMIN (GLUCOPHAGE) 500 MG tablet TAKE 1 TABLET (500 MG TOTAL) BY MOUTH 2 (TWO) TIMES DAILY WITH A MEAL.  . mupirocin ointment (BACTROBAN) 2 % Apply 1 application topically 2 (two) times daily.  Marland Kitchen  neomycin-bacitracin-polymyxin (NEOSPORIN) [phone] ointment Apply daily to sores on feet  . omeprazole (PRILOSEC) 20 MG capsule TAKE 1 CAPSULE (20 MG TOTAL) BY MOUTH DAILY.  Marland Kitchen promethazine (PHENERGAN) 12.5 MG tablet TAKE 1 TABLET (12.5 MG TOTAL) BY MOUTH  EVERY 8 (EIGHT) HOURS AS NEEDED FOR NAUSEA OR VOMITING.  . triamcinolone (NASACORT AQ) 55 MCG/ACT AERO nasal inhaler 2 sprays in each nostril daily (Patient taking differently: 2 sprays in each nostril daily Easy pop top on all medications)  . atenolol (TENORMIN) 25 MG tablet TAKE 1 TABLET BY MOUTH EVERY DAY   No current facility-administered medications on file prior to visit.    ROS  Constitutional: Denies any fever or chills Gastrointestinal: No reported hemesis, hematochezia, vomiting, or acute GI distress Musculoskeletal: Denies any acute onset joint swelling, redness, loss of ROM, or weakness Neurological: No reported episodes of acute onset apraxia, aphasia, dysarthria, agnosia, amnesia, paralysis, loss of coordination, or loss of consciousness  Allergies  Ms. Barbour-Swain is allergic to sulfur; erythromycin stearate; and morphine and related.  [place]  Medical:  Ms. Elcock  has a past medical history of Hypertension; Colitis; Plantar fasciitis; Anxiety; Depression; Neuropathy ([place]); Hyperlipidemia; TMJ (dislocation of temporomandibular joint); IBS (irritable bowel syndrome) (2011-2014); GERD (gastroesophageal reflux disease); Internal hemorrhoids; Trichomoniasis; History of head injury (06/25/2015); Diabetic peripheral neuropathy associated with type 2 diabetes mellitus ([place]) (06/24/2015); Abdominal pain, unspecified site (09/19/2013); Lumbar radicular pain (Bilateral) (R>L) (Right L5 Dermatome) (10/09/2014); and Bilateral great toes ulcers ([place]). Family: family history includes Anxiety disorder in her brother; Cancer in her brother; Cancer (age of onset: 17) in her brother; Colitis in her brother; Heart attack in her father, mother,  and other; Heart disease in her brother and mother. Surgical:  has past surgical history that includes Tubal ligation; Cesarean section (1988); Pelvic exenteration (1990); and tubes tied (2004/2005). Tobacco:  reports that she has been smoking Cigarettes.  She has been smoking about 1.00 pack per day. She has never used smokeless tobacco. Alcohol:  reports that she drinks about 0.6 oz of alcohol per week. Drug:  reports that she does not use illicit drugs.  Constitutional Exam  Vitals: Blood pressure 132/82, pulse 81, temperature 98 F (36.7 C), temperature source Oral, resp. rate 16, weight 164 lb (74.39 kg), last menstrual period 12/14/2011, SpO2 100 %. General appearance: Well nourished, well developed, and well hydrated. In no acute distress Calculated BMI/Body habitus: Body mass index is 24.57 kg/(m^2). (18.5-24.9 kg/m2) Ideal body weight Psych/Mental status: Alert and oriented x 3 (person, place, & time) Eyes: PERLA Respiratory: No evidence of acute respiratory distress  Cervical Spine Exam  Inspection: No masses, redness, or swelling Alignment: Symmetrical ROM: Functional: Unrestricted ROM Active: Adequate ROM Stability: No instability detected Muscle strength & Tone: Functionally intact Sensory: Unimpaired Palpation: No complaints of tenderness  Upper Extremity (UE) Exam    Side: Right upper extremity  Side: Left upper extremity  Inspection: No masses, redness, swelling, or asymmetry  Inspection: No masses, redness, swelling, or asymmetry  ROM:  ROM:  Functional: Unrestricted ROM  Functional: Unrestricted ROM  Active: Adequate ROM  Active: Adequate ROM  Muscle strength & Tone: Functionally intact  Muscle strength & Tone: Functionally intact  Sensory: Unimpaired  Sensory: Unimpaired  Palpation: Non-contributory  Palpation: Non-contributory   Thoracic Spine Exam  Inspection: No masses, redness, or swelling Alignment: Symmetrical ROM: Functional: Unrestricted ROM Active:  Adequate ROM Stability: No instability detected Sensory: Unimpaired Muscle strength & Tone: Functionally intact Palpation: No complaints of tenderness  Lumbar Spine Exam  Inspection: No masses, redness, or swelling Alignment: Symmetrical ROM: Functional: Unrestricted ROM Active: Adequate ROM Stability: No instability detected Muscle strength & Tone: Functionally intact Sensory: Unimpaired Palpation: No complaints of tenderness Provocative Tests: Lumbar Hyperextension and rotation test: deferred  Patrick's Maneuver: deferred  Gait & Posture Assessment  Gait: Antalgic gait (limping) Posture: WNL  Lower Extremity Exam    Side: Right lower extremity  Side: Left lower extremity  Inspection: No masses, redness, swelling, or asymmetry she does have advance of an ulcer and swelling of her big toe, secondary to her diabetic problems. ROM:  Inspection: No masses, redness, swelling, or asymmetry, she indicates having an ulcer on her left toe as well. ROM:  Functional: Unrestricted ROM  Functional: Unrestricted ROM  Active: Adequate ROM  Active: Adequate ROM  Muscle strength & Tone: Functionally intact  Muscle strength & Tone: Functionally intact  Sensory: Unimpaired  Sensory: Unimpaired  Palpation: Non-contributory  Palpation: Non-contributory   Assessment & Plan  Primary Diagnosis & Pertinent Problem List: The primary encounter diagnosis was Chronic pain. Diagnoses of Chronic pain of lower extremity, left, Chronic lumbar radicular pain (Bilateral) (R>L) (Right L5 Dermatome), Lumbar radiculopathy (Bilateral) (EMG performed on 06/18/2014), Diabetic peripheral neuropathy (HCC) (Location of Primary Source of Pain) (Bilateral) (Feet) (L>R), and Opiate use (150 MME/Day) were also pertinent to this visit.  Visit Diagnosis: 1. Chronic pain   2. Chronic pain of lower extremity, left   3. Chronic lumbar radicular pain (Bilateral) (R>L) (Right L5 Dermatome)   4. Lumbar radiculopathy (Bilateral)  (EMG performed on 06/18/2014)   5. Diabetic peripheral neuropathy (HCC) (Location of Primary Source of Pain) (Bilateral) (Feet) (L>R)   6. Opiate use (150 MME/Day)     Problem-specific Plan(s): No problem-specific assessment & plan notes found for this encounter.   Plan of Care   Problem List Items Addressed This Visit      High   Chronic lower extremity pain (Location of Primary Source of Pain) (Bilateral) (L>R) (Chronic)   Chronic lumbar radicular pain (Bilateral) (R>L) (Right L5 Dermatome) (Chronic)   Chronic pain - Primary (Chronic)   Relevant Medications   gabapentin (NEURONTIN) 300 MG capsule   gabapentin (NEURONTIN) 600 MG tablet   Oxycodone HCl 10 MG TABS   Diabetic peripheral neuropathy (HCC) (Location of Primary Source of Pain) (Bilateral) (Feet) (L>R) (Chronic)   Relevant Medications   gabapentin (NEURONTIN) 300 MG capsule   gabapentin (NEURONTIN) 600 MG tablet   Lumbar radiculopathy (Bilateral) (EMG performed on 06/18/2014) (Chronic)   Relevant Medications   gabapentin (NEURONTIN) 300 MG capsule   gabapentin (NEURONTIN) 600 MG tablet   Other Relevant Orders   Ambulatory referral to Neurosurgery     Medium   Opiate use (150 MME/Day) (Chronic)       Pharmacotherapy (Medications Ordered): Meds ordered this encounter  Medications  . gabapentin (NEURONTIN) 300 MG capsule    Sig: Take 1 capsule (300 mg total) by mouth 4 (four) times daily.    Dispense:  120 capsule    Refill:  2    Do not place this medication, or any other prescription from our practice, on "Automatic Refill". Patient may have prescription filled one day early if pharmacy is closed on scheduled refill date.  . gabapentin (NEURONTIN) 600 MG tablet    Sig: Take 1 tablet (600 mg total) by mouth every 6 (six) hours.    Dispense:  120 tablet    Refill:  2    Do not place this medication, or any other prescription from our practice, on "Automatic Refill". Patient may have prescription filled one day  early if pharmacy is closed on scheduled refill date.  . Oxycodone HCl 10 MG TABS    Sig: Take 2 tablets (20 mg total)  by mouth 5 (five) times daily as needed.    Dispense:  150 tablet    Refill:  0    Do not place this medication, or any other prescription from our practice, on "Automatic Refill". Patient may have prescription filled one day early if pharmacy is closed on scheduled refill date. Do not fill until: 12/23/15 To last until: 01/22/16    [facility] & Procedure Ordered: Orders Placed This Encounter  Procedures  . Ambulatory referral to Neurosurgery    Referral Priority:  Routine    Referral Type:  Surgical    Referral Reason:  Specialty Services Required    Requested Specialty:  Neurosurgery    Number of Visits Requested:  1    Imaging Ordered: AMB REFERRAL TO NEUROSURGERY  Interventional Therapies: Scheduled:  None at this time.    Considering:  None at this time.    PRN Procedures:  None at this time.    Referral(s) or Consult(s): None at this time.  Medications administered during this visit: Ms. Minott had no medications administered during this visit.  Requested PM Follow-up: Return in about 3 weeks (around 01/13/2016) for MedMgmt (1-Mo).  Future Appointments Date Time Provider [place]  01/10/2016 10:30 AM Gildardo Cranker, DO [facility] None  01/13/2016 9:00 AM Milinda Pointer, MD [facility] None    Primary Care Physician: Gildardo Cranker, DO Location: [facility] Note by: Kathlen Brunswick. Dossie Arbour, M.D, DABA, DABAPM, DABPM, DABIPP, FIPP  Pain Score Disclaimer: We use the NRS-11 scale. This is a self-reported, subjective measurement of pain severity with only modest accuracy. It is used primarily to identify changes within a particular patient. It must be understood that outpatient pain scales are significantly less accurate that those used for research, where they can be applied under ideal controlled circumstances with  minimal exposure to variables. In reality, the score is likely to be a combination of pain intensity and pain affect, where pain affect describes the degree of emotional arousal or changes in action readiness caused by the sensory experience of pain. Factors such as social and work situation, setting, emotional state, anxiety levels, expectation, and prior pain experience may influence pain perception and show large inter-individual differences that may also be affected by time variables.

## 2015-12-23 NOTE — Patient Instructions (Signed)
Steps to Quit Smoking  Smoking tobacco can be harmful to your health and can affect almost every organ in your body. Smoking puts you, and those around you, at risk for developing many serious chronic diseases. Quitting smoking is difficult, but it is one of the best things that you can do for your health. It is never too late to quit. WHAT ARE THE BENEFITS OF QUITTING SMOKING? When you quit smoking, you lower your risk of developing serious diseases and conditions, such as:  Lung cancer or lung disease, such as COPD.  Heart disease.  Stroke.  Heart attack.  Infertility.  Osteoporosis and bone fractures. Additionally, symptoms such as coughing, wheezing, and shortness of breath may get better when you quit. You may also find that you get sick less often because your body is stronger at fighting off colds and infections. If you are pregnant, quitting smoking can help to reduce your chances of having a baby of low birth weight. HOW DO I GET READY TO QUIT? When you decide to quit smoking, create a plan to make sure that you are successful. Before you quit:  Pick a date to quit. Set a date within the next two weeks to give you time to prepare.  Write down the reasons why you are quitting. Keep this list in places where you will see it often, such as on your bathroom mirror or in your car or wallet.  Identify the people, places, things, and activities that make you want to smoke (triggers) and avoid them. Make sure to take these actions:  Throw away all cigarettes at home, at work, and in your car.  Throw away smoking accessories, such as ashtrays and lighters.  Clean your car and make sure to empty the ashtray.  Clean your home, including curtains and carpets.  Tell your family, friends, and coworkers that you are quitting. Support from your loved ones can make quitting easier.  Talk with your health care provider about your options for quitting smoking.  Find out what treatment  options are covered by your health insurance. WHAT STRATEGIES CAN I USE TO QUIT SMOKING?  Talk with your healthcare provider about different strategies to quit smoking. Some strategies include:  Quitting smoking altogether instead of gradually lessening how much you smoke over a period of time. Research shows that quitting "cold turkey" is more successful than gradually quitting.  Attending in-person counseling to help you build problem-solving skills. You are more likely to have success in quitting if you attend several counseling sessions. Even short sessions of 10 minutes can be effective.  Finding resources and support systems that can help you to quit smoking and remain smoke-free after you quit. These resources are most helpful when you use them often. They can include:  Online chats with a counselor.  Telephone quitlines.  Printed self-help materials.  Support groups or group counseling.  Text messaging programs.  Mobile phone applications.  Taking medicines to help you quit smoking. (If you are pregnant or breastfeeding, talk with your health care provider first.) Some medicines contain nicotine and some do not. Both types of medicines help with cravings, but the medicines that include nicotine help to relieve withdrawal symptoms. Your health care provider may recommend:  Nicotine patches, gum, or lozenges.  Nicotine inhalers or sprays.  Non-nicotine medicine that is taken by mouth. Talk with your health care provider about combining strategies, such as taking medicines while you are also receiving in-person counseling. Using these two strategies together makes   you more likely to succeed in quitting than if you used either strategy on its own. If you are pregnant or breastfeeding, talk with your health care provider about finding counseling or other support strategies to quit smoking. Do not take medicine to help you quit smoking unless told to do so by your health care  provider. WHAT THINGS CAN I DO TO MAKE IT EASIER TO QUIT? Quitting smoking might feel overwhelming at first, but there is a lot that you can do to make it easier. Take these important actions:  Reach out to your family and friends and ask that they support and encourage you during this time. Call telephone quitlines, reach out to support groups, or work with a counselor for support.  Ask people who smoke to avoid smoking around you.  Avoid places that trigger you to smoke, such as bars, parties, or smoke-break areas at work.  Spend time around people who do not smoke.  Lessen stress in your life, because stress can be a smoking trigger for some people. To lessen stress, try:  Exercising regularly.  Deep-breathing exercises.  Yoga.  Meditating.  Performing a body scan. This involves closing your eyes, scanning your body from head to toe, and noticing which parts of your body are particularly tense. Purposefully relax the muscles in those areas.  Download or purchase mobile phone or tablet apps (applications) that can help you stick to your quit plan by providing reminders, tips, and encouragement. There are many free apps, such as QuitGuide from the [employer] for [employer] and Prevention). You can find other support for quitting smoking (smoking cessation) through smokefree.gov and other websites. HOW WILL I FEEL WHEN I QUIT SMOKING? Within the first 24 hours of quitting smoking, you may start to feel some withdrawal symptoms. These symptoms are usually most noticeable 2-3 days after quitting, but they usually do not last beyond 2-3 weeks. Changes or symptoms that you might experience include:  Mood swings.  Restlessness, anxiety, or irritation.  Difficulty concentrating.  Dizziness.  Strong cravings for sugary foods in addition to nicotine.  Mild weight gain.  Constipation.  Nausea.  Coughing or a sore throat.  Changes in how your medicines work in your  body.  A depressed mood.  Difficulty sleeping (insomnia). After the first 2-3 weeks of quitting, you may start to notice more positive results, such as:  Improved sense of smell and taste.  Decreased coughing and sore throat.  Slower heart rate.  Lower blood pressure.  Clearer skin.  The ability to breathe more easily.  Fewer sick days. Quitting smoking is very challenging for most people. Do not get discouraged if you are not successful the first time. Some people need to make many attempts to quit before they achieve long-term success. Do your best to stick to your quit plan, and talk with your health care provider if you have any questions or concerns.   This information is not intended to replace advice given to you by your health care provider. Make sure you discuss any questions you have with your health care provider.   Document Released: 08/04/2001 Document Revised: 12/25/2014 Document Reviewed: 12/25/2014 Elsevier Interactive Patient Education 2016 [place] Can Quit Smoking If you are ready to quit smoking or are thinking about it, congratulations! You have chosen to help yourself be healthier and live longer! There are lots of different ways to quit smoking. Nicotine gum, nicotine patches, a nicotine inhaler, or nicotine nasal spray can help with  physical craving. Hypnosis, support groups, and medicines help break the habit of smoking. TIPS TO GET OFF AND STAY OFF CIGARETTES  Learn to predict your moods. Do not let a bad situation be your excuse to have a cigarette. Some situations in your life might tempt you to have a cigarette.  Ask friends and co-workers not to smoke around you.  Make your home smoke-free.  Never have "just one" cigarette. It leads to wanting another and another. Remind yourself of your decision to quit.  On a card, make a list of your reasons for not smoking. Read it at least the same number of times a day as you have a cigarette. Tell  yourself everyday, "I do not want to smoke. I choose not to smoke."  Ask someone at home or work to help you with your plan to quit smoking.  Have something planned after you eat or have a cup of coffee. Take a walk or get other exercise to perk you up. This will help to keep you from overeating.  Try a relaxation exercise to calm you down and decrease your stress. Remember, you may be tense and nervous the first two weeks after you quit. This will pass.  Find new activities to keep your hands busy. Play with a pen, coin, or rubber band. Doodle or draw things on paper.  Brush your teeth right after eating. This will help cut down the craving for the taste of tobacco after meals. You can try mouthwash too.  Try gum, breath mints, or diet candy to keep something in your mouth. IF YOU SMOKE AND WANT TO QUIT:  Do not stock up on cigarettes. Never buy a carton. Wait until one pack is finished before you buy another.  Never carry cigarettes with you at work or at home.  Keep cigarettes as far away from you as possible. Leave them with someone else.  Never carry matches or a lighter with you.  Ask yourself, "Do I need this cigarette or is this just a reflex?"  Bet with someone that you can quit. Put cigarette money in a piggy bank every morning. If you smoke, you give up the money. If you do not smoke, by the end of the week, you keep the money.  Keep trying. It takes 21 days to change a habit!  Talk to your doctor about using medicines to help you quit. These include nicotine replacement gum, lozenges, or skin patches.   This information is not intended to replace advice given to you by your health care provider. Make sure you discuss any questions you have with your health care provider.   Document Released: 06/06/2009 Document Revised: 11/02/2011 Document Reviewed: 06/06/2009 Elsevier Interactive Patient Education 2016 [place] Can Quit Smoking If you are ready to quit  smoking or are thinking about it, congratulations! You have chosen to help yourself be healthier and live longer! There are lots of different ways to quit smoking. Nicotine gum, nicotine patches, a nicotine inhaler, or nicotine nasal spray can help with physical craving. Hypnosis, support groups, and medicines help break the habit of smoking. TIPS TO GET OFF AND STAY OFF CIGARETTES  Learn to predict your moods. Do not let a bad situation be your excuse to have a cigarette. Some situations in your life might tempt you to have a cigarette.  Ask friends and co-workers not to smoke around you.  Make your home smoke-free.  Never have "just one" cigarette. It leads to wanting another  and another. Remind yourself of your decision to quit.  On a card, make a list of your reasons for not smoking. Read it at least the same number of times a day as you have a cigarette. Tell yourself everyday, "I do not want to smoke. I choose not to smoke."  Ask someone at home or work to help you with your plan to quit smoking.  Have something planned after you eat or have a cup of coffee. Take a walk or get other exercise to perk you up. This will help to keep you from overeating.  Try a relaxation exercise to calm you down and decrease your stress. Remember, you may be tense and nervous the first two weeks after you quit. This will pass.  Find new activities to keep your hands busy. Play with a pen, coin, or rubber band. Doodle or draw things on paper.  Brush your teeth right after eating. This will help cut down the craving for the taste of tobacco after meals. You can try mouthwash too.  Try gum, breath mints, or diet candy to keep something in your mouth. IF YOU SMOKE AND WANT TO QUIT:  Do not stock up on cigarettes. Never buy a carton. Wait until one pack is finished before you buy another.  Never carry cigarettes with you at work or at home.  Keep cigarettes as far away from you as possible. Leave them  with someone else.  Never carry matches or a lighter with you.  Ask yourself, "Do I need this cigarette or is this just a reflex?"  Bet with someone that you can quit. Put cigarette money in a piggy bank every morning. If you smoke, you give up the money. If you do not smoke, by the end of the week, you keep the money.  Keep trying. It takes 21 days to change a habit!  Talk to your doctor about using medicines to help you quit. These include nicotine replacement gum, lozenges, or skin patches.   This information is not intended to replace advice given to you by your health care provider. Make sure you discuss any questions you have with your health care provider.   Document Released: 06/06/2009 Document Revised: 11/02/2011 Document Reviewed: 06/06/2009 Elsevier Interactive Patient Education [employer].

## 2015-12-23 NOTE — Progress Notes (Signed)
Safety precautions to be maintained throughout the outpatient stay will include: orient to surroundings, keep bed in low position, maintain call bell within reach at all times, provide assistance with transfer out of bed and ambulation. Pill count is 0/180 oxycodone hcl 10 mg tablets filled on 11/16/2015.

## 2015-12-25 ENCOUNTER — Other Ambulatory Visit: Payer: Self-pay

## 2015-12-25 MED ORDER — PROMETHAZINE HCL 12.5 MG PO TABS: 12.5000 mg | ORAL_TABLET | Freq: Three times a day (TID) | ORAL | Status: DC | PRN

## 2015-12-25 NOTE — Telephone Encounter (Signed)
Rx sent to [facility] for promethazine 12.5 mg tablets. Take 1 tablet by mouth tid prn. #30 No RF

## 2016-01-03 ENCOUNTER — Telehealth: Payer: Self-pay | Admitting: *Deleted

## 2016-01-03 NOTE — Telephone Encounter (Signed)
[facility] [employer] called wanting to know if the prescription response has been received. They stated that they fax over inquiring about back, wrist, and knee brace, and was wondering when the response will be faxed. Their number is [phone]. Thanks

## 2016-01-03 NOTE — Telephone Encounter (Signed)
Attempted to call this company and tell them that we did not receive any paperwork.  No answer.

## 2016-01-07 ENCOUNTER — Other Ambulatory Visit: Payer: Self-pay | Admitting: *Deleted

## 2016-01-07 DIAGNOSIS — F411 Generalized anxiety disorder: Secondary | ICD-10-CM

## 2016-01-07 MED ORDER — ALPRAZOLAM 1 MG PO TABS: ORAL_TABLET | ORAL | Status: DC

## 2016-01-07 NOTE — Telephone Encounter (Signed)
Attempted to call company again.  No answer.

## 2016-01-07 NOTE — Telephone Encounter (Signed)
Patient requested, printed and faxed to pharmacy.

## 2016-01-08 ENCOUNTER — Other Ambulatory Visit: Payer: Self-pay

## 2016-01-08 DIAGNOSIS — F411 Generalized anxiety disorder: Secondary | ICD-10-CM

## 2016-01-08 MED ORDER — ALPRAZOLAM 1 MG PO TABS: ORAL_TABLET | ORAL | Status: DC

## 2016-01-08 NOTE — Telephone Encounter (Signed)
Prescription sent to pharmacy.  [place] [address] Phone [phone] Fax [phone]

## 2016-01-10 ENCOUNTER — Ambulatory Visit: Payer: Self-pay | Admitting: Internal Medicine

## 2016-01-13 ENCOUNTER — Encounter: Payer: BLUE CROSS/BLUE SHIELD | Admitting: Pain Medicine

## 2016-01-15 ENCOUNTER — Encounter: Payer: Self-pay | Admitting: Internal Medicine

## 2016-01-15 ENCOUNTER — Ambulatory Visit (INDEPENDENT_AMBULATORY_CARE_PROVIDER_SITE_OTHER): Payer: BLUE CROSS/BLUE SHIELD | Admitting: Internal Medicine

## 2016-01-15 VITALS — BP 116/74 | HR 75 | Temp 98.0°F | Ht 69.0 in | Wt 174.0 lb

## 2016-01-15 DIAGNOSIS — I10 Essential (primary) hypertension: Secondary | ICD-10-CM | POA: Diagnosis not present

## 2016-01-15 DIAGNOSIS — Z1239 Encounter for other screening for malignant neoplasm of breast: Secondary | ICD-10-CM | POA: Diagnosis not present

## 2016-01-15 DIAGNOSIS — G5791 Unspecified mononeuropathy of right lower limb: Secondary | ICD-10-CM | POA: Diagnosis not present

## 2016-01-15 DIAGNOSIS — K219 Gastro-esophageal reflux disease without esophagitis: Secondary | ICD-10-CM | POA: Diagnosis not present

## 2016-01-15 DIAGNOSIS — E11621 Type 2 diabetes mellitus with foot ulcer: Secondary | ICD-10-CM | POA: Diagnosis not present

## 2016-01-15 DIAGNOSIS — G5793 Unspecified mononeuropathy of bilateral lower limbs: Secondary | ICD-10-CM

## 2016-01-15 DIAGNOSIS — M7989 Other specified soft tissue disorders: Secondary | ICD-10-CM

## 2016-01-15 DIAGNOSIS — F411 Generalized anxiety disorder: Secondary | ICD-10-CM

## 2016-01-15 DIAGNOSIS — L97529 Non-pressure chronic ulcer of other part of left foot with unspecified severity: Secondary | ICD-10-CM

## 2016-01-15 DIAGNOSIS — E785 Hyperlipidemia, unspecified: Secondary | ICD-10-CM | POA: Diagnosis not present

## 2016-01-15 DIAGNOSIS — E1142 Type 2 diabetes mellitus with diabetic polyneuropathy: Secondary | ICD-10-CM

## 2016-01-15 DIAGNOSIS — G5792 Unspecified mononeuropathy of left lower limb: Secondary | ICD-10-CM

## 2016-01-15 DIAGNOSIS — M5416 Radiculopathy, lumbar region: Secondary | ICD-10-CM

## 2016-01-15 DIAGNOSIS — M2550 Pain in unspecified joint: Secondary | ICD-10-CM | POA: Diagnosis not present

## 2016-01-15 DIAGNOSIS — L97519 Non-pressure chronic ulcer of other part of right foot with unspecified severity: Secondary | ICD-10-CM

## 2016-01-15 MED ORDER — OMEPRAZOLE 40 MG PO CPDR: 40.0000 mg | DELAYED_RELEASE_CAPSULE | Freq: Every day | ORAL | Status: DC

## 2016-01-15 NOTE — Progress Notes (Addendum)
Location:  [facility] Place of Service: OFFICE  Chief Complaint  Patient presents with  . Medical Management of Chronic Issues    Follow up, DM foot exam due  . Health Maintenance    Patient will schedule eye exam when she checks with insurance. Patient will schedule annual exam to include pap smear     HPI:  50 yo female seen today for f/u. She reports increased forgetfulness x several weeks. She is trying to get disability for chronic pain and uncontrolled psychiatric conditions. She gets hip injections by Dr Dossie Arbour but they are not helpful. He has referred her to neurology. She sites financial difficulty and requests to get pain rx from PCP   She has medial right leg knot with pain and bruise. It was initially red and feverish but that has improved  She has anxiety and takes xanax for panic attacks. Has tried to wean off in the past without success. She does not see mental health specialist as none locally will accept her insurance.   DM - A1c 6.1%. She takes metformin. (+) neuropathy. She saw podiatry for b/l 1st toe ulcerations. Skin debrided and now healing. She wears different shoes now  She has dysphagia to pills. Hx enlarged thyroid but labs were nml. MRI of C spine in 2015 revealed bone spur on left.  She has significant hot flashes worse at night  Past Medical History  Diagnosis Date  . Hypertension   . Colitis   . Plantar fasciitis   . Anxiety   . Depression   . Neuropathy (HCC)     severe both feet/lower legs  . Hyperlipidemia   . TMJ (dislocation of temporomandibular joint)   . IBS (irritable bowel syndrome) 2011-2014    ER visits only   . GERD (gastroesophageal reflux disease)   . Internal hemorrhoids   . Trichomoniasis   . History of head injury 06/25/2015  . Diabetic peripheral neuropathy associated with type 2 diabetes mellitus ([place]) 06/24/2015  . Abdominal pain, unspecified site 09/19/2013  . Lumbar radicular pain (Bilateral) (R>L) (Right L5 Dermatome)  10/09/2014    On the right leg to pain goes to the top of the foot and big toe following an L5 dermatomal distribution. On the left side it does not go all the way into the foot.   . Bilateral great toes ulcers ([place])     Past Surgical History  Procedure Laterality Date  . Tubal ligation    . Cesarean section  1988    [facility]  . Pelvic exenteration  1990    Uterus Frozen (cancer)  . Tubes tied  2004/2005    [facility]    Patient Care Team: Gildardo Cranker, DO as PCP - General (Internal Medicine) Milinda Pointer, MD as Referring Physician (Internal Medicine)  Social History   Social History  . Marital Status: Married    Spouse Name: Micheal   . Number of Children: 1  . Years of Education: college   Occupational History  . disabled    Social History Main Topics  . Smoking status: Current Every Day Smoker -- 1.00 packs/day    Types: Cigarettes  . Smokeless tobacco: Never Used  . Alcohol Use: 0.6 oz/week    1 Glasses of wine per week     Comment: on weekend occassionally  . Drug Use: No  . Sexual Activity: Not on file   Other Topics Concern  . Not on file   Social History Narrative   Patient lives at  home with her husband Systems analyst)   Trying to get disability.   Education college    Right handed.     reports that she has been smoking Cigarettes.  She has been smoking about 1.00 pack per day. She has never used smokeless tobacco. She reports that she drinks about 0.6 oz of alcohol per week. She reports that she does not use illicit drugs.  Allergies  Allergen Reactions  . Sulfur   . Erythromycin Stearate     REACTION: causes anxiety, heart to race  . Morphine And Related Nausea And Vomiting    Medications: Patient's Medications  New Prescriptions   No medications on file  Previous Medications   ALPRAZOLAM (XANAX) 1 MG TABLET    Take one tablet by mouth three times daily as needed for anxiety   ASPIRIN-ACETAMINOPHEN-CAFFEINE (EXCEDRIN MIGRAINE) 250-250-65 MG PER  TABLET    Take by mouth every 6 (six) hours as needed for headache.   ATENOLOL (TENORMIN) 25 MG TABLET    TAKE 1 TABLET BY MOUTH EVERY DAY   ATORVASTATIN (LIPITOR) 10 MG TABLET    TAKE 1 TABLET (10 MG TOTAL) BY MOUTH DAILY.   CALCIUM ACETATE, PHOS BINDER, (CALCIUM ACETATE PO)    Take by mouth. By mouth every other day.   CITALOPRAM (CELEXA) 40 MG TABLET    TAKE 1 TABLET BY MOUTH DAILY   FLUCONAZOLE (DIFLUCAN) 150 MG TABLET    Take 1 tablet (150 mg total) by mouth once.   GABAPENTIN (NEURONTIN) 300 MG CAPSULE    Take 1 capsule (300 mg total) by mouth 4 (four) times daily.   GABAPENTIN (NEURONTIN) 600 MG TABLET    Take 1 tablet (600 mg total) by mouth every 6 (six) hours.   GLUCOSE BLOOD TEST STRIP    1 each by Other route as needed for other. Reli on prime: check blood sugar twice daily DX: 250.62   HYDROCHLOROTHIAZIDE (HYDRODIURIL) 25 MG TABLET    TAKE 1 TABLET (25 MG TOTAL) BY MOUTH DAILY.   LACTOBACILLUS (ACIDOPHILUS PROBIOTIC) 100 MG CAPS    Take 1 capsule (100 mg total) by mouth daily.   LOPERAMIDE (IMODIUM A-D) 2 MG TABLET    Take 2 mg by mouth 3 (three) times daily as needed. Take 2 tabs by mouth once daily as needed for diarrhea.   METFORMIN (GLUCOPHAGE) 500 MG TABLET    TAKE 1 TABLET (500 MG TOTAL) BY MOUTH 2 (TWO) TIMES DAILY WITH A MEAL.   MUPIROCIN OINTMENT (BACTROBAN) 2 %    Apply 1 application topically 2 (two) times daily.   NEOMYCIN-BACITRACIN-POLYMYXIN (NEOSPORIN) [id number] OINTMENT    Apply daily to sores on feet   OMEPRAZOLE (PRILOSEC) 20 MG CAPSULE    TAKE 1 CAPSULE (20 MG TOTAL) BY MOUTH DAILY.   OXYCODONE HCL 10 MG TABS    Take 2 tablets (20 mg total) by mouth 5 (five) times daily as needed.   PROMETHAZINE (PHENERGAN) 12.5 MG TABLET    Take 1 tablet (12.5 mg total) by mouth 3 (three) times daily as needed for nausea or vomiting.   TRIAMCINOLONE (NASACORT AQ) 55 MCG/ACT AERO NASAL INHALER    2 sprays in each nostril daily  Modified Medications   No medications on file    Discontinued Medications   No medications on file    Review of Systems  Unable to perform ROS: Psychiatric disorder    Filed Vitals:   01/15/16 1603  BP: 116/74  Pulse: 75  Temp: 98 F (36.7 C)  TempSrc: Oral  Height: 5' 9"  (1.753 m)  Weight: 174 lb (78.926 kg)  SpO2: 96%   Body mass index is 25.68 kg/(m^2).  Physical Exam  Constitutional: She is oriented to person, place, and time. She appears well-developed and well-nourished. No distress.  HENT:  Head: Normocephalic.  Mouth/Throat: Oropharynx is clear and moist. No oropharyngeal exudate.  Eyes: Pupils are equal, round, and reactive to light. No scleral icterus.  Neck: Neck supple. Carotid bruit is not present. No tracheal deviation present. No thyromegaly present.  Cardiovascular: Normal rate, regular rhythm and intact distal pulses.  Exam reveals no gallop and no friction rub.   Murmur (1/6 SEM) heard. Trace RLE edema but no LLE edema. no calf TTP.   Pulmonary/Chest: Effort normal and breath sounds normal. No stridor. No respiratory distress. She has no wheezes. She has no rales.  Abdominal: Soft. Bowel sounds are normal. She exhibits no distension and no mass. There is no hepatomegaly. There is tenderness (RUQ). There is no rebound and no guarding.  Musculoskeletal: She exhibits edema and tenderness.  Lymphadenopathy:    She has cervical adenopathy (NT).  Neurological: She is alert and oriented to person, place, and time.  Skin: Skin is warm and dry. Rash noted.  B/l 1st toe ulceration  Psychiatric: Her mood appears anxious. Her speech is rapid and/or pressured. She is agitated.   Diabetic Foot Exam - Simple   Simple Foot Form  Diabetic Foot exam was performed with the following findings:  Yes 01/15/2016  5:13 PM  Visual Inspection  See comments:  Yes  Sensation Testing  See comments:  Yes  Pulse Check  Posterior Tibialis and Dorsalis pulse intact bilaterally:  Yes  Comments  Monofilament testing reduced b/l.  B/l 1st stage 1-2 ulceration with toenail deformity. No d/c or bleeding. No secondary signs of infection        Labs reviewed: No visits with results within 3 Month(s) from this visit. Latest known visit with results is:  Clinical Support on 09/19/2015  Component Date Value Ref Range Status  . Sodium 09/19/2015 139  135 - 145 mmol/L Final  . Potassium 09/19/2015 3.6  3.5 - 5.1 mmol/L Final  . Chloride 09/19/2015 99* 101 - 111 mmol/L Final  . CO2 09/19/2015 31  22 - 32 mmol/L Final  . Glucose, Bld 09/19/2015 98  65 - 99 mg/dL Final  . BUN 09/19/2015 12  6 - 20 mg/dL Final  . Creatinine, Ser 09/19/2015 0.63  0.44 - 1.00 mg/dL Final  . Calcium 09/19/2015 9.8  8.9 - 10.3 mg/dL Final  . Total Protein 09/19/2015 8.3* 6.5 - 8.1 g/dL Final  . Albumin 09/19/2015 4.9  3.5 - 5.0 g/dL Final  . AST 09/19/2015 24  15 - 41 U/L Final  . ALT 09/19/2015 18  14 - 54 U/L Final  . Alkaline Phosphatase 09/19/2015 86  38 - 126 U/L Final  . Total Bilirubin 09/19/2015 0.6  0.3 - 1.2 mg/dL Final  . GFR calc non Af Amer 09/19/2015 >60  >60 mL/min Final  . GFR calc Af Amer 09/19/2015 >60  >60 mL/min Final   Comment: (NOTE) The eGFR has been calculated using the CKD EPI equation. This calculation has not been validated in all clinical situations. eGFR's persistently <60 mL/min signify possible Chronic Kidney Disease.   . Anion gap 09/19/2015 9  5 - 15 Final  . CRP 09/19/2015 0.9  <1.0 mg/dL Final   Performed at [facility]  . Magnesium 09/19/2015 2.2  1.7 - 2.4 mg/dL Final  . Sed Rate 09/19/2015 31* 0 - 20 mm/hr Final  . Hgb A1c MFr Bld 09/19/2015 6.1* 4.0 - 6.0 % Final    No results found.   Assessment/Plan   ICD-9-CM ICD-10-CM   1. Right leg swelling 729.81 M79.89 Ultrasound doppler venous legs bilat  2. Type 2 diabetes mellitus with diabetic polyneuropathy, without long-term current use of insulin (HCC) 250.60 E11.42 Hemoglobin A1c   357.2  CMP  3. Neuropathic pain of both legs 356.9  G57.91     G57.92   4. Essential hypertension, benign 401.1 I10   5. Hyperlipidemia LDL goal <100 272.4 E78.5 Lipid Panel     TSH  6. Lumbar radicular pain 724.4 M54.16   7. Multiple joint pain 719.49 M25.50   8. Generalized anxiety disorder 300.02 F41.1   9. Breast cancer screening V76.10 Z12.39 MM Digital Diagnostic Bilat  10. Gastroesophageal reflux disease without esophagitis 530.81 K21.9 omeprazole (PRILOSEC) 40 MG capsule  11. Diabetic ulcer of both feet associated with type 2 diabetes mellitus ([place]) 250.80 E11.621    707.15 L97.519     L97.529    b/l 1st toe   Increase omeprazole to 49m daily  Continue other medications as ordered  Recommend follow up with mental health specialist  Follow up with specialists as scheduled  Needs to get release from pain clinic prior to office taking over pain med Refills  [facility] [address] [address]([phone] (Monday-Friday 8 AM - 5 PM) ([phone] 5 PM and follow prompts

## 2016-01-15 NOTE — Patient Instructions (Signed)
Will call with lab results  Will call with leg ultrasound appt  Increase omeprazole to 40mg  daily  Continue other medications as ordered  Recommend follow up with mental health specialist  Follow up with specialists as scheduled  Needs to get release from pain clinic prior to office taking over pain med Refills  Follow up in 2 mos for CPE/pap

## 2016-01-16 LAB — COMPREHENSIVE METABOLIC PANEL
ALK PHOS: 83 IU/L (ref 39–117)
ALT: 16 IU/L (ref 0–32)
AST: 17 IU/L (ref 0–40)
Albumin/Globulin Ratio: 1.7 (ref 1.2–2.2)
Albumin: 4.5 g/dL (ref 3.5–5.5)
BUN / CREAT RATIO: 14 (ref 9–23)
BUN: 9 mg/dL (ref 6–24)
Bilirubin Total: <0.2 mg/dL (ref 0.0–1.2)
CALCIUM: 10.1 mg/dL (ref 8.7–10.2)
CO2: 27 mmol/L (ref 18–29)
CREATININE: 0.63 mg/dL (ref 0.57–1.00)
Chloride: 97 mmol/L (ref 96–106)
GFR calc Af Amer: 124 mL/min/{1.73_m2} (ref >59)
GFR, EST NON AFRICAN AMERICAN: 107 mL/min/{1.73_m2} (ref >59)
Globulin, Total: 2.6 g/dL (ref 1.5–4.5)
Glucose: 98 mg/dL (ref 65–99)
Potassium: 3.6 mmol/L (ref 3.5–5.2)
Sodium: 144 mmol/L (ref 134–144)
Total Protein: 7.1 g/dL (ref 6.0–8.5)

## 2016-01-16 LAB — HEMOGLOBIN A1C
ESTIMATED AVERAGE GLUCOSE: 140 mg/dL
HEMOGLOBIN A1C: 6.5 % — AB (ref 4.8–5.6)

## 2016-01-16 LAB — LIPID PANEL
CHOL/HDL RATIO: 4 ratio (ref 0.0–4.4)
CHOLESTEROL TOTAL: 186 mg/dL (ref 100–199)
HDL: 47 mg/dL (ref >39)
TRIGLYCERIDES: 546 mg/dL — AB (ref 0–149)

## 2016-01-16 LAB — TSH: TSH: 2.21 u[IU]/mL (ref 0.450–4.500)

## 2016-01-21 ENCOUNTER — Telehealth: Payer: Self-pay

## 2016-01-21 NOTE — Telephone Encounter (Signed)
Attempted to call patient to relay this message but I had to leave a message for the patient to call the office.

## 2016-01-21 NOTE — Telephone Encounter (Signed)
No. Pain specialist needs to fax a letter to the office indicating pain contract released. Only then will we fill her medication

## 2016-01-21 NOTE — Telephone Encounter (Signed)
Patient called triage line today to find out if Dr. Eulas Post would fill Rx for pain medications. Patient states that she is going to be released from current pain management clinic because current provider is going to refer her to specialist for her back pain. She also stated that she had a discussion with Dr. Eulas Post about this office providing pain medication prescriptions beginning this month.   She wanted to know how she was supposed to get a release from the pain management clinic and what kind of proof does she need to bring to this office.   Patient would like to know if her pain medications can be refilled on Wednesday 01/22/16?   Please advise.

## 2016-01-21 NOTE — Telephone Encounter (Signed)
Spoke with patient and informed her that a letter would have to be faxed from pain clinic that stated her release from their care. Patient stated that she understood and would have them fax the letter as soon as possible.

## 2016-01-22 ENCOUNTER — Telehealth: Payer: Self-pay

## 2016-01-22 ENCOUNTER — Telehealth: Payer: Self-pay | Admitting: Pain Medicine

## 2016-01-22 ENCOUNTER — Encounter: Payer: Self-pay | Admitting: *Deleted

## 2016-01-22 ENCOUNTER — Telehealth: Payer: Self-pay | Admitting: *Deleted

## 2016-01-22 DIAGNOSIS — G8929 Other chronic pain: Secondary | ICD-10-CM

## 2016-01-22 DIAGNOSIS — E1142 Type 2 diabetes mellitus with diabetic polyneuropathy: Secondary | ICD-10-CM

## 2016-01-22 MED ORDER — OXYCODONE HCL 10 MG PO TABS: 20.0000 mg | ORAL_TABLET | Freq: Every day | ORAL | Status: DC | PRN

## 2016-01-22 NOTE — Telephone Encounter (Signed)
Patient called and stated that she is no longer with [facility] pain Management Clinic. We received a letter from Dr. Milinda Pointer stating:"Denise Webb is no longer a patient of Dr. Dossie Arbour. She is no longer receiving narcotic pain medications form our clinic. Oxycodone was last prescribed 12/23/15." (placed in Dr. Vale Haven folder to review) Patient states her pain medication is due today and wants to get a Rx. For Oxycodone and her Gabapentin. Please Advise.

## 2016-01-22 NOTE — Telephone Encounter (Signed)
rec'd letter. Rx for oxycodone printed and signed. Ok to fill gabapentin as ordered

## 2016-01-22 NOTE — Telephone Encounter (Signed)
Patient contacted to verify that she no longer is seeing Dr.Naveira for narcotic med management. Letter sent to Dr. Eulas Post

## 2016-01-22 NOTE — Telephone Encounter (Signed)
Patient needs a letter releasing her from pain med contract sent to Dr. Alvy Beal with [facility] At phone [phone]      fax [phone]

## 2016-01-22 NOTE — Telephone Encounter (Signed)
Letter was received releasing patient from pain clinic. Per Dr.Carter ok to refill Oxycodone and Gabapentin  Left message on voicemail for patient to return call when available, reason for call- ? Which pharmacy to send Gabapentin to

## 2016-01-23 MED ORDER — GABAPENTIN 600 MG PO TABS: 600.0000 mg | ORAL_TABLET | Freq: Four times a day (QID) | ORAL | Status: DC

## 2016-01-23 MED ORDER — GABAPENTIN 300 MG PO CAPS: 300.0000 mg | ORAL_CAPSULE | Freq: Four times a day (QID) | ORAL | Status: DC

## 2016-01-23 NOTE — Telephone Encounter (Signed)
Patient called back to confirm which pharmacy Gabapentin to be sent to. RX's sent. Patient aware rx for Oxycodone is at the front desk ready for pick-up

## 2016-01-24 ENCOUNTER — Other Ambulatory Visit: Payer: Self-pay | Admitting: Internal Medicine

## 2016-01-27 ENCOUNTER — Emergency Department (HOSPITAL_COMMUNITY): Payer: BLUE CROSS/BLUE SHIELD

## 2016-01-27 ENCOUNTER — Inpatient Hospital Stay (HOSPITAL_COMMUNITY)
Admission: EM | Admit: 2016-01-27 | Discharge: 2016-01-30 | DRG: 536 | Disposition: A | Discharge status: Home Health | Payer: BLUE CROSS/BLUE SHIELD | Attending: General Surgery | Admitting: General Surgery

## 2016-01-27 ENCOUNTER — Encounter (HOSPITAL_COMMUNITY): Payer: Self-pay | Admitting: Emergency Medicine

## 2016-01-27 DIAGNOSIS — S32301A Unspecified fracture of right ilium, initial encounter for closed fracture: Secondary | ICD-10-CM | POA: Diagnosis not present

## 2016-01-27 DIAGNOSIS — Z8249 Family history of ischemic heart disease and other diseases of the circulatory system: Secondary | ICD-10-CM

## 2016-01-27 DIAGNOSIS — E872 Acidosis, unspecified: Secondary | ICD-10-CM

## 2016-01-27 DIAGNOSIS — Z7984 Long term (current) use of oral hypoglycemic drugs: Secondary | ICD-10-CM

## 2016-01-27 DIAGNOSIS — S32009A Unspecified fracture of unspecified lumbar vertebra, initial encounter for closed fracture: Secondary | ICD-10-CM

## 2016-01-27 DIAGNOSIS — I10 Essential (primary) hypertension: Secondary | ICD-10-CM | POA: Diagnosis present

## 2016-01-27 DIAGNOSIS — F17210 Nicotine dependence, cigarettes, uncomplicated: Secondary | ICD-10-CM | POA: Diagnosis present

## 2016-01-27 DIAGNOSIS — D62 Acute posthemorrhagic anemia: Secondary | ICD-10-CM | POA: Diagnosis not present

## 2016-01-27 DIAGNOSIS — K219 Gastro-esophageal reflux disease without esophagitis: Secondary | ICD-10-CM | POA: Diagnosis present

## 2016-01-27 DIAGNOSIS — F10929 Alcohol use, unspecified with intoxication, unspecified: Secondary | ICD-10-CM | POA: Diagnosis present

## 2016-01-27 DIAGNOSIS — F419 Anxiety disorder, unspecified: Secondary | ICD-10-CM | POA: Diagnosis present

## 2016-01-27 DIAGNOSIS — F329 Major depressive disorder, single episode, unspecified: Secondary | ICD-10-CM | POA: Diagnosis present

## 2016-01-27 DIAGNOSIS — M4856XA Collapsed vertebra, not elsewhere classified, lumbar region, initial encounter for fracture: Secondary | ICD-10-CM | POA: Diagnosis present

## 2016-01-27 DIAGNOSIS — F10920 Alcohol use, unspecified with intoxication, uncomplicated: Secondary | ICD-10-CM

## 2016-01-27 DIAGNOSIS — Z79899 Other long term (current) drug therapy: Secondary | ICD-10-CM

## 2016-01-27 DIAGNOSIS — Z888 Allergy status to other drugs, medicaments and biological substances status: Secondary | ICD-10-CM

## 2016-01-27 DIAGNOSIS — S32309A Unspecified fracture of unspecified ilium, initial encounter for closed fracture: Secondary | ICD-10-CM | POA: Diagnosis present

## 2016-01-27 DIAGNOSIS — Z885 Allergy status to narcotic agent status: Secondary | ICD-10-CM

## 2016-01-27 DIAGNOSIS — S32010A Wedge compression fracture of first lumbar vertebra, initial encounter for closed fracture: Secondary | ICD-10-CM

## 2016-01-27 DIAGNOSIS — E1142 Type 2 diabetes mellitus with diabetic polyneuropathy: Secondary | ICD-10-CM | POA: Diagnosis present

## 2016-01-27 DIAGNOSIS — R338 Other retention of urine: Secondary | ICD-10-CM | POA: Diagnosis present

## 2016-01-27 DIAGNOSIS — S2220XA Unspecified fracture of sternum, initial encounter for closed fracture: Secondary | ICD-10-CM | POA: Diagnosis present

## 2016-01-27 DIAGNOSIS — Z881 Allergy status to other antibiotic agents status: Secondary | ICD-10-CM

## 2016-01-27 DIAGNOSIS — E785 Hyperlipidemia, unspecified: Secondary | ICD-10-CM | POA: Diagnosis present

## 2016-01-27 DIAGNOSIS — F10120 Alcohol abuse with intoxication, uncomplicated: Secondary | ICD-10-CM | POA: Diagnosis present

## 2016-01-27 DIAGNOSIS — Z7951 Long term (current) use of inhaled steroids: Secondary | ICD-10-CM

## 2016-01-27 DIAGNOSIS — S32019A Unspecified fracture of first lumbar vertebra, initial encounter for closed fracture: Secondary | ICD-10-CM | POA: Diagnosis present

## 2016-01-27 HISTORY — DX: Unspecified fracture of right ilium, initial encounter for closed fracture: S32.301A

## 2016-01-27 HISTORY — DX: Acute posthemorrhagic anemia: D62

## 2016-01-27 HISTORY — DX: Unspecified fracture of sternum, initial encounter for closed fracture: S22.20XA

## 2016-01-27 HISTORY — DX: Type 2 diabetes mellitus without complications: E11.9

## 2016-01-27 LAB — CBC WITH DIFFERENTIAL/PLATELET
Basophils Absolute: 0 10*3/uL (ref 0.0–0.1)
Basophils Relative: 0 %
Eosinophils Absolute: 0.1 10*3/uL (ref 0.0–0.7)
Eosinophils Relative: 1 %
HCT: 36 % (ref 36.0–46.0)
Hemoglobin: 11.9 g/dL — ABNORMAL LOW (ref 12.0–15.0)
Lymphocytes Relative: 27 %
Lymphs Abs: 3 10*3/uL (ref 0.7–4.0)
MCH: 31.2 pg (ref 26.0–34.0)
MCHC: 33.1 g/dL (ref 30.0–36.0)
MCV: 94.2 fL (ref 78.0–100.0)
Monocytes Absolute: 0.5 10*3/uL (ref 0.1–1.0)
Monocytes Relative: 5 %
Neutro Abs: 7.3 10*3/uL (ref 1.7–7.7)
Neutrophils Relative %: 67 %
Platelets: 269 10*3/uL (ref 150–400)
RBC: 3.82 MIL/uL — ABNORMAL LOW (ref 3.87–5.11)
RDW: 11.8 % (ref 11.5–15.5)
WBC: 10.9 10*3/uL — ABNORMAL HIGH (ref 4.0–10.5)

## 2016-01-27 LAB — COMPREHENSIVE METABOLIC PANEL
ALBUMIN: 3.8 g/dL (ref 3.5–5.0)
ALK PHOS: 71 U/L (ref 38–126)
ALT: 25 U/L (ref 14–54)
AST: 39 U/L (ref 15–41)
Anion gap: 9 (ref 5–15)
BUN: 5 mg/dL — ABNORMAL LOW (ref 6–20)
CALCIUM: 9.2 mg/dL (ref 8.9–10.3)
CO2: 25 mmol/L (ref 22–32)
CREATININE: 0.51 mg/dL (ref 0.44–1.00)
Chloride: 105 mmol/L (ref 101–111)
Glucose, Bld: 131 mg/dL — ABNORMAL HIGH (ref 65–99)
Potassium: 3.1 mmol/L — ABNORMAL LOW (ref 3.5–5.1)
Sodium: 139 mmol/L (ref 135–145)
TOTAL PROTEIN: 6.8 g/dL (ref 6.5–8.1)
Total Bilirubin: 0.6 mg/dL (ref 0.3–1.2)

## 2016-01-27 LAB — I-STAT CG4 LACTIC ACID, ED: Lactic Acid, Venous: 4.39 mmol/L (ref 0.5–2.0)

## 2016-01-27 LAB — I-STAT CHEM 8, ED
BUN: 3 mg/dL — ABNORMAL LOW (ref 6–20)
Calcium, Ion: 1.14 mmol/L (ref 1.12–1.23)
Chloride: 102 mmol/L (ref 101–111)
Creatinine, Ser: 0.8 mg/dL (ref 0.44–1.00)
Glucose, Bld: 130 mg/dL — ABNORMAL HIGH (ref 65–99)
HCT: 38 % (ref 36.0–46.0)
Hemoglobin: 12.9 g/dL (ref 12.0–15.0)
Potassium: 3.2 mmol/L — ABNORMAL LOW (ref 3.5–5.1)
Sodium: 142 mmol/L (ref 135–145)
TCO2: 24 mmol/L (ref 0–100)

## 2016-01-27 LAB — URINALYSIS, ROUTINE W REFLEX MICROSCOPIC
Bilirubin Urine: NEGATIVE
Glucose, UA: NEGATIVE mg/dL
Hgb urine dipstick: NEGATIVE
Ketones, ur: NEGATIVE mg/dL
Leukocytes, UA: NEGATIVE
Nitrite: NEGATIVE
Protein, ur: NEGATIVE mg/dL
Specific Gravity, Urine: 1.008 (ref 1.005–1.030)
pH: 6.5 (ref 5.0–8.0)

## 2016-01-27 LAB — PROTIME-INR
INR: 0.96 (ref 0.00–1.49)
Prothrombin Time: 13 seconds (ref 11.6–15.2)

## 2016-01-27 LAB — ETHANOL: Alcohol, Ethyl (B): 166 mg/dL — ABNORMAL HIGH (ref <5)

## 2016-01-27 LAB — CDS SEROLOGY

## 2016-01-27 MED ORDER — KETAMINE HCL-SODIUM CHLORIDE 100-0.9 MG/10ML-% IV SOSY
0.3000 mg/kg | PREFILLED_SYRINGE | Freq: Once | INTRAVENOUS | Status: AC
  Administered 2016-01-27: 23 mg via INTRAVENOUS
  Filled 2016-01-27: qty 10

## 2016-01-27 MED ORDER — KETAMINE HCL-SODIUM CHLORIDE 100-0.9 MG/10ML-% IV SOSY
PREFILLED_SYRINGE | INTRAVENOUS | Status: AC
  Filled 2016-01-27: qty 10

## 2016-01-27 MED ORDER — IOPAMIDOL (ISOVUE-300) INJECTION 61%
INTRAVENOUS | Status: AC
  Filled 2016-01-27: qty 100

## 2016-01-27 MED ORDER — KETAMINE HCL-SODIUM CHLORIDE 100-0.9 MG/10ML-% IV SOSY
0.3000 mg/kg | PREFILLED_SYRINGE | Freq: Once | INTRAVENOUS | Status: AC
  Administered 2016-01-27: 24 mg via INTRAVENOUS

## 2016-01-27 MED ORDER — SODIUM CHLORIDE 0.9 % IV BOLUS (SEPSIS)
1000.0000 mL | Freq: Once | INTRAVENOUS | Status: AC
  Administered 2016-01-27: 1000 mL via INTRAVENOUS
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
## 2016-01-27 NOTE — ED Notes (Signed)
Dr. Kathrynn Humble at bedside, successfully reduced hip.

## 2016-01-27 NOTE — ED Notes (Signed)
R leg propped up on pillows and towels for comfort.

## 2016-01-27 NOTE — ED Notes (Signed)
Pt removed her c-collar, c-collar replaced by this RN and Architectural technologist.

## 2016-01-27 NOTE — ED Notes (Signed)
Dr. Nanavati at bedside 
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
## 2016-01-28 ENCOUNTER — Encounter (HOSPITAL_COMMUNITY): Payer: Self-pay | Admitting: General Practice

## 2016-01-28 ENCOUNTER — Emergency Department (HOSPITAL_COMMUNITY): Payer: BLUE CROSS/BLUE SHIELD

## 2016-01-28 ENCOUNTER — Inpatient Hospital Stay (HOSPITAL_COMMUNITY): Payer: BLUE CROSS/BLUE SHIELD

## 2016-01-28 DIAGNOSIS — Z888 Allergy status to other drugs, medicaments and biological substances status: Secondary | ICD-10-CM | POA: Diagnosis not present

## 2016-01-28 DIAGNOSIS — S32019A Unspecified fracture of first lumbar vertebra, initial encounter for closed fracture: Secondary | ICD-10-CM | POA: Diagnosis present

## 2016-01-28 DIAGNOSIS — D62 Acute posthemorrhagic anemia: Secondary | ICD-10-CM | POA: Diagnosis not present

## 2016-01-28 DIAGNOSIS — M4856XA Collapsed vertebra, not elsewhere classified, lumbar region, initial encounter for fracture: Secondary | ICD-10-CM | POA: Diagnosis present

## 2016-01-28 DIAGNOSIS — F10929 Alcohol use, unspecified with intoxication, unspecified: Secondary | ICD-10-CM | POA: Diagnosis present

## 2016-01-28 DIAGNOSIS — F329 Major depressive disorder, single episode, unspecified: Secondary | ICD-10-CM | POA: Diagnosis present

## 2016-01-28 DIAGNOSIS — S2220XA Unspecified fracture of sternum, initial encounter for closed fracture: Secondary | ICD-10-CM | POA: Diagnosis present

## 2016-01-28 DIAGNOSIS — E785 Hyperlipidemia, unspecified: Secondary | ICD-10-CM | POA: Diagnosis present

## 2016-01-28 DIAGNOSIS — F10120 Alcohol abuse with intoxication, uncomplicated: Secondary | ICD-10-CM | POA: Diagnosis present

## 2016-01-28 DIAGNOSIS — R338 Other retention of urine: Secondary | ICD-10-CM | POA: Diagnosis present

## 2016-01-28 DIAGNOSIS — F17210 Nicotine dependence, cigarettes, uncomplicated: Secondary | ICD-10-CM | POA: Diagnosis present

## 2016-01-28 DIAGNOSIS — Z7951 Long term (current) use of inhaled steroids: Secondary | ICD-10-CM | POA: Diagnosis not present

## 2016-01-28 DIAGNOSIS — Z79899 Other long term (current) drug therapy: Secondary | ICD-10-CM | POA: Diagnosis not present

## 2016-01-28 DIAGNOSIS — K219 Gastro-esophageal reflux disease without esophagitis: Secondary | ICD-10-CM | POA: Diagnosis present

## 2016-01-28 DIAGNOSIS — Z885 Allergy status to narcotic agent status: Secondary | ICD-10-CM | POA: Diagnosis not present

## 2016-01-28 DIAGNOSIS — I10 Essential (primary) hypertension: Secondary | ICD-10-CM | POA: Diagnosis present

## 2016-01-28 DIAGNOSIS — F419 Anxiety disorder, unspecified: Secondary | ICD-10-CM | POA: Diagnosis present

## 2016-01-28 DIAGNOSIS — S32301A Unspecified fracture of right ilium, initial encounter for closed fracture: Secondary | ICD-10-CM | POA: Diagnosis present

## 2016-01-28 DIAGNOSIS — E872 Acidosis: Secondary | ICD-10-CM | POA: Diagnosis present

## 2016-01-28 DIAGNOSIS — S32309A Unspecified fracture of unspecified ilium, initial encounter for closed fracture: Secondary | ICD-10-CM | POA: Diagnosis present

## 2016-01-28 DIAGNOSIS — Z8249 Family history of ischemic heart disease and other diseases of the circulatory system: Secondary | ICD-10-CM | POA: Diagnosis not present

## 2016-01-28 DIAGNOSIS — Z881 Allergy status to other antibiotic agents status: Secondary | ICD-10-CM | POA: Diagnosis not present

## 2016-01-28 DIAGNOSIS — Z7984 Long term (current) use of oral hypoglycemic drugs: Secondary | ICD-10-CM | POA: Diagnosis not present

## 2016-01-28 DIAGNOSIS — E1142 Type 2 diabetes mellitus with diabetic polyneuropathy: Secondary | ICD-10-CM | POA: Diagnosis present

## 2016-01-28 LAB — SURGICAL PCR SCREEN
MRSA, PCR: NEGATIVE
STAPHYLOCOCCUS AUREUS: NEGATIVE

## 2016-01-28 LAB — CBC
HCT: 31.7 % — ABNORMAL LOW (ref 36.0–46.0)
HEMATOCRIT: 34 % — AB (ref 36.0–46.0)
HEMOGLOBIN: 10.4 g/dL — AB (ref 12.0–15.0)
HEMOGLOBIN: 11.2 g/dL — AB (ref 12.0–15.0)
MCH: 31.1 pg (ref 26.0–34.0)
MCH: 31 pg (ref 26.0–34.0)
MCHC: 32.9 g/dL (ref 30.0–36.0)
MCHC: 32.8 g/dL (ref 30.0–36.0)
MCV: 94.3 fL (ref 78.0–100.0)
MCV: 94.4 fL (ref 78.0–100.0)
PLATELETS: 216 10*3/uL (ref 150–400)
Platelets: 234 10*3/uL (ref 150–400)
RBC: 3.36 MIL/uL — AB (ref 3.87–5.11)
RBC: 3.6 MIL/uL — AB (ref 3.87–5.11)
RDW: 11.8 % (ref 11.5–15.5)
RDW: 12 % (ref 11.5–15.5)
WBC: 12.3 10*3/uL — AB (ref 4.0–10.5)
WBC: 7.5 10*3/uL (ref 4.0–10.5)

## 2016-01-28 LAB — BASIC METABOLIC PANEL
ANION GAP: 8 (ref 5–15)
CHLORIDE: 106 mmol/L (ref 101–111)
CO2: 28 mmol/L (ref 22–32)
Calcium: 8.6 mg/dL — ABNORMAL LOW (ref 8.9–10.3)
Creatinine, Ser: 0.52 mg/dL (ref 0.44–1.00)
GFR calc Af Amer: >60 mL/min (ref >60)
Glucose, Bld: 110 mg/dL — ABNORMAL HIGH (ref 65–99)
POTASSIUM: 3.3 mmol/L — AB (ref 3.5–5.1)
SODIUM: 142 mmol/L (ref 135–145)

## 2016-01-28 LAB — GLUCOSE, CAPILLARY
GLUCOSE-CAPILLARY: 114 mg/dL — AB (ref 65–99)
Glucose-Capillary: 107 mg/dL — ABNORMAL HIGH (ref 65–99)
Glucose-Capillary: 111 mg/dL — ABNORMAL HIGH (ref 65–99)
Glucose-Capillary: 117 mg/dL — ABNORMAL HIGH (ref 65–99)

## 2016-01-28 LAB — I-STAT CG4 LACTIC ACID, ED: Lactic Acid, Venous: 2.22 mmol/L (ref 0.5–2.0)

## 2016-01-28 LAB — SAMPLE TO BLOOD BANK

## 2016-01-28 MED ORDER — ATENOLOL 50 MG PO TABS
25.0000 mg | ORAL_TABLET | Freq: Every day | ORAL | Status: DC
  Administered 2016-01-28 – 2016-01-30 (×3): 25 mg via ORAL
  Filled 2016-01-28 (×3): qty 1

## 2016-01-28 MED ORDER — POTASSIUM CHLORIDE CRYS ER 20 MEQ PO TBCR
40.0000 meq | EXTENDED_RELEASE_TABLET | Freq: Two times a day (BID) | ORAL | Status: DC
  Administered 2016-01-28 (×2): 40 meq via ORAL
  Filled 2016-01-28 (×2): qty 2

## 2016-01-28 MED ORDER — HYDROMORPHONE HCL 1 MG/ML IJ SOLN
0.5000 mg | Freq: Once | INTRAMUSCULAR | Status: AC
  Administered 2016-01-28: 0.5 mg via INTRAVENOUS
  Filled 2016-01-28: qty 1

## 2016-01-28 MED ORDER — INSULIN ASPART 100 UNIT/ML ~~LOC~~ SOLN: 0.0000 [IU] | Freq: Every day | SUBCUTANEOUS | Status: DC

## 2016-01-28 MED ORDER — HYDROCHLOROTHIAZIDE 25 MG PO TABS
25.0000 mg | ORAL_TABLET | Freq: Every day | ORAL | Status: DC
  Administered 2016-01-28 – 2016-01-30 (×3): 25 mg via ORAL
  Filled 2016-01-28 (×4): qty 1

## 2016-01-28 MED ORDER — ONDANSETRON HCL 4 MG/2ML IJ SOLN: 4.0000 mg | Freq: Four times a day (QID) | INTRAMUSCULAR | Status: DC | PRN

## 2016-01-28 MED ORDER — SODIUM CHLORIDE 0.9 % IV BOLUS (SEPSIS)
1000.0000 mL | Freq: Once | INTRAVENOUS | Status: AC
  Administered 2016-01-28: 1000 mL via INTRAVENOUS

## 2016-01-28 MED ORDER — NAPROXEN 250 MG PO TABS
500.0000 mg | ORAL_TABLET | Freq: Two times a day (BID) | ORAL | Status: DC
  Administered 2016-01-28 – 2016-01-30 (×4): 500 mg via ORAL
  Filled 2016-01-28 (×4): qty 2

## 2016-01-28 MED ORDER — TRIAMCINOLONE ACETONIDE 55 MCG/ACT NA AERO
2.0000 | INHALATION_SPRAY | Freq: Every day | NASAL | Status: DC
  Administered 2016-01-29 – 2016-01-30 (×2): 2 via NASAL
  Filled 2016-01-28: qty 21.6

## 2016-01-28 MED ORDER — ONDANSETRON HCL 4 MG PO TABS: 4.0000 mg | ORAL_TABLET | Freq: Four times a day (QID) | ORAL | Status: DC | PRN

## 2016-01-28 MED ORDER — ALPRAZOLAM 0.5 MG PO TABS
1.0000 mg | ORAL_TABLET | Freq: Three times a day (TID) | ORAL | Status: DC | PRN
  Administered 2016-01-28 – 2016-01-29 (×2): 1 mg via ORAL
  Filled 2016-01-28 (×2): qty 2
  Filled 2016-01-28: qty 4

## 2016-01-28 MED ORDER — ENOXAPARIN SODIUM 40 MG/0.4ML ~~LOC~~ SOLN
40.0000 mg | SUBCUTANEOUS | Status: DC
  Administered 2016-01-28 – 2016-01-30 (×3): 40 mg via SUBCUTANEOUS
  Filled 2016-01-28 (×3): qty 0.4

## 2016-01-28 MED ORDER — INSULIN ASPART 100 UNIT/ML ~~LOC~~ SOLN: 0.0000 [IU] | Freq: Three times a day (TID) | SUBCUTANEOUS | Status: DC

## 2016-01-28 MED ORDER — POLYETHYLENE GLYCOL 3350 17 G PO PACK
17.0000 g | PACK | Freq: Every day | ORAL | Status: DC
  Administered 2016-01-28 – 2016-01-30 (×3): 17 g via ORAL
  Filled 2016-01-28 (×3): qty 1

## 2016-01-28 MED ORDER — DOCUSATE SODIUM 100 MG PO CAPS
100.0000 mg | ORAL_CAPSULE | Freq: Two times a day (BID) | ORAL | Status: DC
  Administered 2016-01-28 – 2016-01-30 (×5): 100 mg via ORAL
  Filled 2016-01-28 (×5): qty 1

## 2016-01-28 MED ORDER — HYDROMORPHONE HCL 1 MG/ML IJ SOLN
1.0000 mg | INTRAMUSCULAR | Status: DC | PRN
  Administered 2016-01-28 (×2): 1 mg via INTRAVENOUS
  Filled 2016-01-28 (×2): qty 1

## 2016-01-28 MED ORDER — METHOCARBAMOL 1000 MG/10ML IJ SOLN
1000.0000 mg | Freq: Three times a day (TID) | INTRAVENOUS | Status: DC | PRN
  Administered 2016-01-28: 1000 mg via INTRAVENOUS
  Filled 2016-01-28 (×3): qty 10

## 2016-01-28 MED ORDER — KCL IN DEXTROSE-NACL 20-5-0.45 MEQ/L-%-% IV SOLN
INTRAVENOUS | Status: DC
  Administered 2016-01-28: 05:00:00 via INTRAVENOUS
  Filled 2016-01-28: qty 1000

## 2016-01-28 MED ORDER — HYDROMORPHONE HCL 1 MG/ML IJ SOLN
0.5000 mg | INTRAMUSCULAR | Status: DC | PRN
Start: 2016-01-28 — End: 2016-01-30
  Administered 2016-01-28: 0.5 mg via INTRAVENOUS
  Filled 2016-01-28: qty 1

## 2016-01-28 MED ORDER — GABAPENTIN 600 MG PO TABS
600.0000 mg | ORAL_TABLET | Freq: Four times a day (QID) | ORAL | Status: DC
  Administered 2016-01-28 – 2016-01-30 (×9): 600 mg via ORAL
  Filled 2016-01-28 (×11): qty 1

## 2016-01-28 MED ORDER — OXYCODONE HCL 5 MG PO TABS
5.0000 mg | ORAL_TABLET | ORAL | Status: DC | PRN
  Administered 2016-01-28: 15 mg via ORAL
  Administered 2016-01-28: 10 mg via ORAL
  Administered 2016-01-28 – 2016-01-29 (×4): 15 mg via ORAL
  Filled 2016-01-28 (×2): qty 3
  Filled 2016-01-28: qty 2
  Filled 2016-01-28 (×3): qty 3

## 2016-01-28 MED ORDER — PANTOPRAZOLE SODIUM 40 MG PO TBEC
40.0000 mg | DELAYED_RELEASE_TABLET | Freq: Two times a day (BID) | ORAL | Status: DC
  Administered 2016-01-28 – 2016-01-30 (×5): 40 mg via ORAL
  Filled 2016-01-28 (×5): qty 1

## 2016-01-28 MED ORDER — CITALOPRAM HYDROBROMIDE 40 MG PO TABS
40.0000 mg | ORAL_TABLET | Freq: Every day | ORAL | Status: DC
  Administered 2016-01-28 – 2016-01-29 (×2): 40 mg via ORAL
  Filled 2016-01-28 (×2): qty 1
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
## 2016-01-28 NOTE — Progress Notes (Signed)
Orthopedic Tech Progress Note Patient Details:  Denise Webb [birth year] [id number]  Patient ID: Inge Rise, female   DOB: 15-Jul-1966, 50 y.o.   MRN: [id number] Called in bio-tech brace order; spoke with Buckner Malta, Staisha Winiarski 01/28/2016, 9:28 AM
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
## 2016-01-28 NOTE — ED Notes (Signed)
If needed, patient's family may be reached at: Christell Constant: [phone] or Virl Son: [phone]
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
## 2016-01-29 ENCOUNTER — Encounter: Payer: BLUE CROSS/BLUE SHIELD | Admitting: Pain Medicine

## 2016-01-29 LAB — BASIC METABOLIC PANEL
ANION GAP: 11 (ref 5–15)
BUN: 6 mg/dL (ref 6–20)
CO2: 26 mmol/L (ref 22–32)
Calcium: 9.4 mg/dL (ref 8.9–10.3)
Chloride: 101 mmol/L (ref 101–111)
Creatinine, Ser: 0.54 mg/dL (ref 0.44–1.00)
GFR calc Af Amer: >60 mL/min (ref >60)
GLUCOSE: 107 mg/dL — AB (ref 65–99)
POTASSIUM: 3.8 mmol/L (ref 3.5–5.1)
SODIUM: 138 mmol/L (ref 135–145)

## 2016-01-29 LAB — CBC
HCT: 32.2 % — ABNORMAL LOW (ref 36.0–46.0)
Hemoglobin: 10.6 g/dL — ABNORMAL LOW (ref 12.0–15.0)
MCH: 31 pg (ref 26.0–34.0)
MCHC: 32.9 g/dL (ref 30.0–36.0)
MCV: 94.2 fL (ref 78.0–100.0)
PLATELETS: 222 10*3/uL (ref 150–400)
RBC: 3.42 MIL/uL — AB (ref 3.87–5.11)
RDW: 12.2 % (ref 11.5–15.5)
WBC: 7.1 10*3/uL (ref 4.0–10.5)

## 2016-01-29 LAB — GLUCOSE, CAPILLARY
GLUCOSE-CAPILLARY: 119 mg/dL — AB (ref 65–99)
Glucose-Capillary: 116 mg/dL — ABNORMAL HIGH (ref 65–99)
Glucose-Capillary: 114 mg/dL — ABNORMAL HIGH (ref 65–99)
Glucose-Capillary: 103 mg/dL — ABNORMAL HIGH (ref 65–99)

## 2016-01-29 MED ORDER — TRAMADOL HCL 50 MG PO TABS
100.0000 mg | ORAL_TABLET | Freq: Four times a day (QID) | ORAL | Status: DC
  Administered 2016-01-29 – 2016-01-30 (×5): 100 mg via ORAL
  Filled 2016-01-29 (×5): qty 2

## 2016-01-29 MED ORDER — OXYCODONE HCL 5 MG PO TABS
10.0000 mg | ORAL_TABLET | ORAL | Status: DC | PRN
  Administered 2016-01-29: 20 mg via ORAL
  Administered 2016-01-29: 15 mg via ORAL
  Administered 2016-01-29 – 2016-01-30 (×4): 20 mg via ORAL
  Filled 2016-01-29 (×3): qty 4
  Filled 2016-01-29: qty 3
  Filled 2016-01-29 (×2): qty 4
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
## 2016-01-30 ENCOUNTER — Inpatient Hospital Stay (HOSPITAL_COMMUNITY): Payer: BLUE CROSS/BLUE SHIELD

## 2016-01-30 ENCOUNTER — Telehealth: Payer: Self-pay

## 2016-01-30 DIAGNOSIS — E1142 Type 2 diabetes mellitus with diabetic polyneuropathy: Secondary | ICD-10-CM

## 2016-01-30 DIAGNOSIS — R338 Other retention of urine: Secondary | ICD-10-CM | POA: Diagnosis present

## 2016-01-30 LAB — GLUCOSE, CAPILLARY
GLUCOSE-CAPILLARY: 113 mg/dL — AB (ref 65–99)
Glucose-Capillary: 100 mg/dL — ABNORMAL HIGH (ref 65–99)

## 2016-01-30 MED ORDER — NAPROXEN 500 MG PO TABS: 500.0000 mg | ORAL_TABLET | Freq: Two times a day (BID) | ORAL | Status: DC

## 2016-01-30 MED ORDER — TRAMADOL HCL 50 MG PO TABS: 100.0000 mg | ORAL_TABLET | Freq: Four times a day (QID) | ORAL | Status: DC

## 2016-01-30 MED ORDER — OXYCODONE-ACETAMINOPHEN 10-325 MG PO TABS: 1.0000 | ORAL_TABLET | ORAL | Status: DC | PRN

## 2016-01-30 NOTE — Progress Notes (Signed)
Patient ID: Denise Webb, female   DOB: [birth year], 50 y.o.   MRN: [id number] Well no leg pain patient and his mobilization status is per orthopedics.  Strength 5 out of 5 check upright film and brace mobilize with therapy
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
## 2016-01-30 NOTE — Discharge Instructions (Signed)
Keep brace on when sitting or standing.  No driving while taking oxycodone.
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
## 2016-01-31 ENCOUNTER — Telehealth (HOSPITAL_COMMUNITY): Payer: Self-pay

## 2016-01-31 ENCOUNTER — Telehealth: Payer: Self-pay | Admitting: Pain Medicine

## 2016-01-31 MED ORDER — GABAPENTIN 600 MG PO TABS: 600.0000 mg | ORAL_TABLET | Freq: Four times a day (QID) | ORAL | Status: DC
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
## 2016-01-31 NOTE — Telephone Encounter (Signed)
Patient is no longer a patient of Dr Dossie Arbour per letter written by D. Wheatley on 01-22-16.

## 2016-01-31 NOTE — Telephone Encounter (Signed)
Patient scheduled appointment with Janett Billow for Monday. Dr.Carter has no available appointments next week. Patient would like to know if phenergan can be added back to her medication list and sent to pharmacy (patient not sure why it was removed from medication list )

## 2016-01-31 NOTE — Telephone Encounter (Signed)
No - she can discuss at her appt on Monday

## 2016-01-31 NOTE — Telephone Encounter (Signed)
No - she needs to get pain meds from Ortho. Ok to RF gabapentin. Too early to RF xanax

## 2016-01-31 NOTE — Telephone Encounter (Signed)
Patient has switched to [place] and received 150 percocet, then brought script from hospital for 50 percocet, then a tramadol script, please call to discuss, is patient under contract here at pain clinic?  [phone] [facility]

## 2016-01-31 NOTE — Telephone Encounter (Signed)
Wanted to get foley out before Thurs appt with urology. I told her I thought that was her best option and my recommendation was to wait until then.

## 2016-02-03 ENCOUNTER — Ambulatory Visit (INDEPENDENT_AMBULATORY_CARE_PROVIDER_SITE_OTHER): Payer: BLUE CROSS/BLUE SHIELD | Admitting: Nurse Practitioner

## 2016-02-03 ENCOUNTER — Encounter: Payer: Self-pay | Admitting: Nurse Practitioner

## 2016-02-03 ENCOUNTER — Telehealth: Payer: Self-pay

## 2016-02-03 VITALS — BP 128/78 | HR 75 | Temp 97.9°F | Resp 17

## 2016-02-03 DIAGNOSIS — S32018D Other fracture of first lumbar vertebra, subsequent encounter for fracture with routine healing: Secondary | ICD-10-CM

## 2016-02-03 DIAGNOSIS — E1142 Type 2 diabetes mellitus with diabetic polyneuropathy: Secondary | ICD-10-CM | POA: Diagnosis not present

## 2016-02-03 DIAGNOSIS — F411 Generalized anxiety disorder: Secondary | ICD-10-CM | POA: Diagnosis not present

## 2016-02-03 DIAGNOSIS — G8929 Other chronic pain: Secondary | ICD-10-CM | POA: Diagnosis not present

## 2016-02-03 DIAGNOSIS — S32309D Unspecified fracture of unspecified ilium, subsequent encounter for fracture with routine healing: Secondary | ICD-10-CM | POA: Diagnosis not present

## 2016-02-03 MED ORDER — GABAPENTIN 300 MG PO CAPS: 300.0000 mg | ORAL_CAPSULE | Freq: Four times a day (QID) | ORAL | Status: DC | PRN

## 2016-02-03 MED ORDER — ALPRAZOLAM 1 MG PO TABS: ORAL_TABLET | ORAL | Status: DC

## 2016-02-03 MED ORDER — GABAPENTIN 600 MG PO TABS: 600.0000 mg | ORAL_TABLET | Freq: Four times a day (QID) | ORAL | Status: DC
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
## 2016-02-03 NOTE — Patient Instructions (Signed)
To follow up with orthopedic for pain management -- make sure you bring your pill bottles to that appt Follow up with urology to removed foley  Xanax and gabapentin refilled
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
## 2016-02-05 ENCOUNTER — Telehealth: Payer: Self-pay | Admitting: *Deleted
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
## 2016-02-05 NOTE — Telephone Encounter (Signed)
Ok to have #60 percocet no RF

## 2016-02-06 MED ORDER — OXYCODONE-ACETAMINOPHEN 10-325 MG PO TABS: 1.0000 | ORAL_TABLET | ORAL | Status: DC | PRN

## 2016-02-06 NOTE — Telephone Encounter (Signed)
Called patient and she wanted to know if you could prescribe a pain medication without the Tylenol because it irritates her IBS. Please Advise.

## 2016-02-06 NOTE — Telephone Encounter (Signed)
LM with a gentleman for patient to return my call.

## 2016-02-06 NOTE — Telephone Encounter (Signed)
Spoke with Daughter in law, patient in seeing a Dr. Rockey Situ her that I was going to print her Rx she has had in the past for pick up.

## 2016-02-06 NOTE — Telephone Encounter (Signed)
No the tylenol also helps her pain

## 2016-02-07 ENCOUNTER — Telehealth: Payer: Self-pay | Admitting: *Deleted

## 2016-02-07 NOTE — Telephone Encounter (Signed)
Megan with [facility] [phone] called and stated that she had concerns and not comfortable with filling patients Oxycodone just written by our office for #60.  She stated that patient is pharmacy hopping, has been to [facility], [facility], [facility] and [facility]. She has gotten since June #150 Oxycodone 10mg  (carter), #60 Oxycodone 10/325 (Dr. Silvestre Gunner) and #100 Tramdol (Dr. Silvestre Gunner).  Stated that this is why she was discharged from previous Pain Dr. Due to multiple Rx.  Pharmacist stated that she could not fill this medication without making you aware and without approval from provider.  Please Advise.

## 2016-02-07 NOTE — Telephone Encounter (Signed)
Noted. Please fill this Rx and notify pt will not allow additional RF from this office. She will need to get pain med from Ortho until fx healed

## 2016-02-07 NOTE — Telephone Encounter (Signed)
Pharmacy and patient notified. Patient wants an appointment with Dr. Eulas Post to discuss pain medication. Appointment scheduled for next Friday.

## 2016-02-14 ENCOUNTER — Ambulatory Visit: Payer: BLUE CROSS/BLUE SHIELD | Admitting: Internal Medicine

## 2016-02-19 ENCOUNTER — Telehealth: Payer: Self-pay | Admitting: *Deleted

## 2016-02-19 NOTE — Telephone Encounter (Signed)
Patient called and stated that she wanted a refill on her Oxycodone 20. Per Dr. [place] note on 02/07/2016 she will need to get pain medication from Orthopedic until the fracture heals. Patient was notified this and agreed on the 16th and scheduled an appointment with Dr. Eulas Post to discuss. In the meantime patient canceled her appointment with Dr. Eulas Post and called today requesting a refill. Patient was instructed again to call her Orthopedic Provider. Agreed.

## 2016-02-21 NOTE — Telephone Encounter (Signed)
I spoke with Ortho office by phone. Pt rec'd Rx for Oxycodone 20mg  QID prn #60 for 02/22/16 today from Dr Matilde Haymaker while in his office. No rx to be written from this office.
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
## 2016-02-21 NOTE — Telephone Encounter (Signed)
Tammy with Dr. Briscoe Burns office called and stated that the patient was seen today at his office. She stated that patient will not be receiving any pain medications from him. Patient is requesting and he refused to fill. Please Advise.

## 2016-02-21 NOTE — Telephone Encounter (Signed)
I called Dr.Giofrre's office and Dr.Carter spoke directly with Dr.Giofree's assistnat. Per Dr.Carter patient was given rx for Oxycodone 20 mg QID today by Dr.Gioffre's office. No refill to be given on any pain medications at this time.  Patient aware and states this is a miscommunication and misunderstanding. Patient will call Dr.Giofrre's office on Monday to get this situation clarified

## 2016-02-24 ENCOUNTER — Other Ambulatory Visit: Payer: Self-pay | Admitting: Internal Medicine

## 2016-02-24 ENCOUNTER — Telehealth: Payer: Self-pay

## 2016-02-24 ENCOUNTER — Other Ambulatory Visit: Payer: Self-pay | Admitting: Pain Medicine

## 2016-02-24 DIAGNOSIS — Z1231 Encounter for screening mammogram for malignant neoplasm of breast: Secondary | ICD-10-CM

## 2016-02-24 MED ORDER — OXYCODONE HCL 20 MG PO TABS: ORAL_TABLET | ORAL | Status: DC

## 2016-02-24 NOTE — Telephone Encounter (Signed)
Discussed with patient, rx printed for signature.

## 2016-02-24 NOTE — Telephone Encounter (Signed)
[place] for oxycodone 20mg  po QID for #60. No further RF to be given

## 2016-02-24 NOTE — Telephone Encounter (Signed)
Message left on triage voicemail, patient called Dr.Gioffre's office (spoke with Marlowe Kays) and cleared all confusion about Oxcodone script. Patient stated we can call and clarify as well, rx was not given to her at last ov with Dr.Gioffre   I called Dr.Gioffre's office and spoke with Jenene Slicker apologized about all the confusion on Friday. The office note was worded as if patient was given rx, after researching last refill of Oxycodone 20 mg was 02-06-16 1 by mouth four times daily #60 and that was to last patient until 02-22-16. Patient was not given a rx on Friday 02-21-16.  Patient is now out of medication and any future refills will need to be authorized by Dr.Carter   Please advise

## 2016-02-27 ENCOUNTER — Other Ambulatory Visit: Payer: Self-pay | Admitting: Internal Medicine

## 2016-02-27 ENCOUNTER — Other Ambulatory Visit: Payer: Self-pay | Admitting: *Deleted

## 2016-02-27 MED ORDER — CITALOPRAM HYDROBROMIDE 40 MG PO TABS: 40.0000 mg | ORAL_TABLET | Freq: Every day | ORAL | Status: DC

## 2016-03-02 ENCOUNTER — Other Ambulatory Visit: Payer: Self-pay | Admitting: *Deleted

## 2016-03-02 DIAGNOSIS — F411 Generalized anxiety disorder: Secondary | ICD-10-CM

## 2016-03-02 MED ORDER — ALPRAZOLAM 1 MG PO TABS: ORAL_TABLET | ORAL | Status: AC

## 2016-03-02 NOTE — Telephone Encounter (Signed)
Patient requested to be faxed to pharmacy 

## 2016-03-16 ENCOUNTER — Telehealth: Payer: Self-pay | Admitting: *Deleted

## 2016-03-16 NOTE — Telephone Encounter (Signed)
Geri in Medical Records received Release form from [facility] [employer]. Patient is also scheduled here for an appointment on Wednesday for CPE. Roland Earl called the office to confirm and they stated that they have already seen patient on Friday for establishment and gave her Rx's for Oxycodone 20mg  #150, Trazadone 150mg  #45 and Alprazolam 1mg  #90. Deneise Lever stated that patient brought in a typed up medication list of her current medications. She also stated that Alvester Chou, NP is also going to send patient to the pain clinic. Dr. Mirna Mires.

## 2016-03-18 ENCOUNTER — Encounter: Payer: BLUE CROSS/BLUE SHIELD | Admitting: Internal Medicine

## 2016-03-18 NOTE — Telephone Encounter (Signed)
Noted - pt has drug seeking behavior. Please forward to Caren Griffins for potential dishcharge from practice. Thank you

## 2016-03-19 NOTE — Telephone Encounter (Signed)
Forwarded to [employer]

## 2016-04-15 DIAGNOSIS — Z029 Encounter for administrative examinations, unspecified: Secondary | ICD-10-CM

## 2016-05-02 ENCOUNTER — Other Ambulatory Visit: Payer: Self-pay | Admitting: Adult Health

## 2016-05-02 DIAGNOSIS — Z1231 Encounter for screening mammogram for malignant neoplasm of breast: Secondary | ICD-10-CM

## 2016-05-06 ENCOUNTER — Ambulatory Visit: Payer: BLUE CROSS/BLUE SHIELD

## 2016-05-14 ENCOUNTER — Ambulatory Visit: Payer: BLUE CROSS/BLUE SHIELD

## 2016-06-26 IMAGING — DX DG LUMBAR SPINE 2-3V
2 series · 2 of 2 positions shown · non-contrast
Comparison: [DATE].

CLINICAL DATA: Followup fractured vertebra.

EXAM:
LUMBAR SPINE - 2-3 VIEW

[l-spine lat]
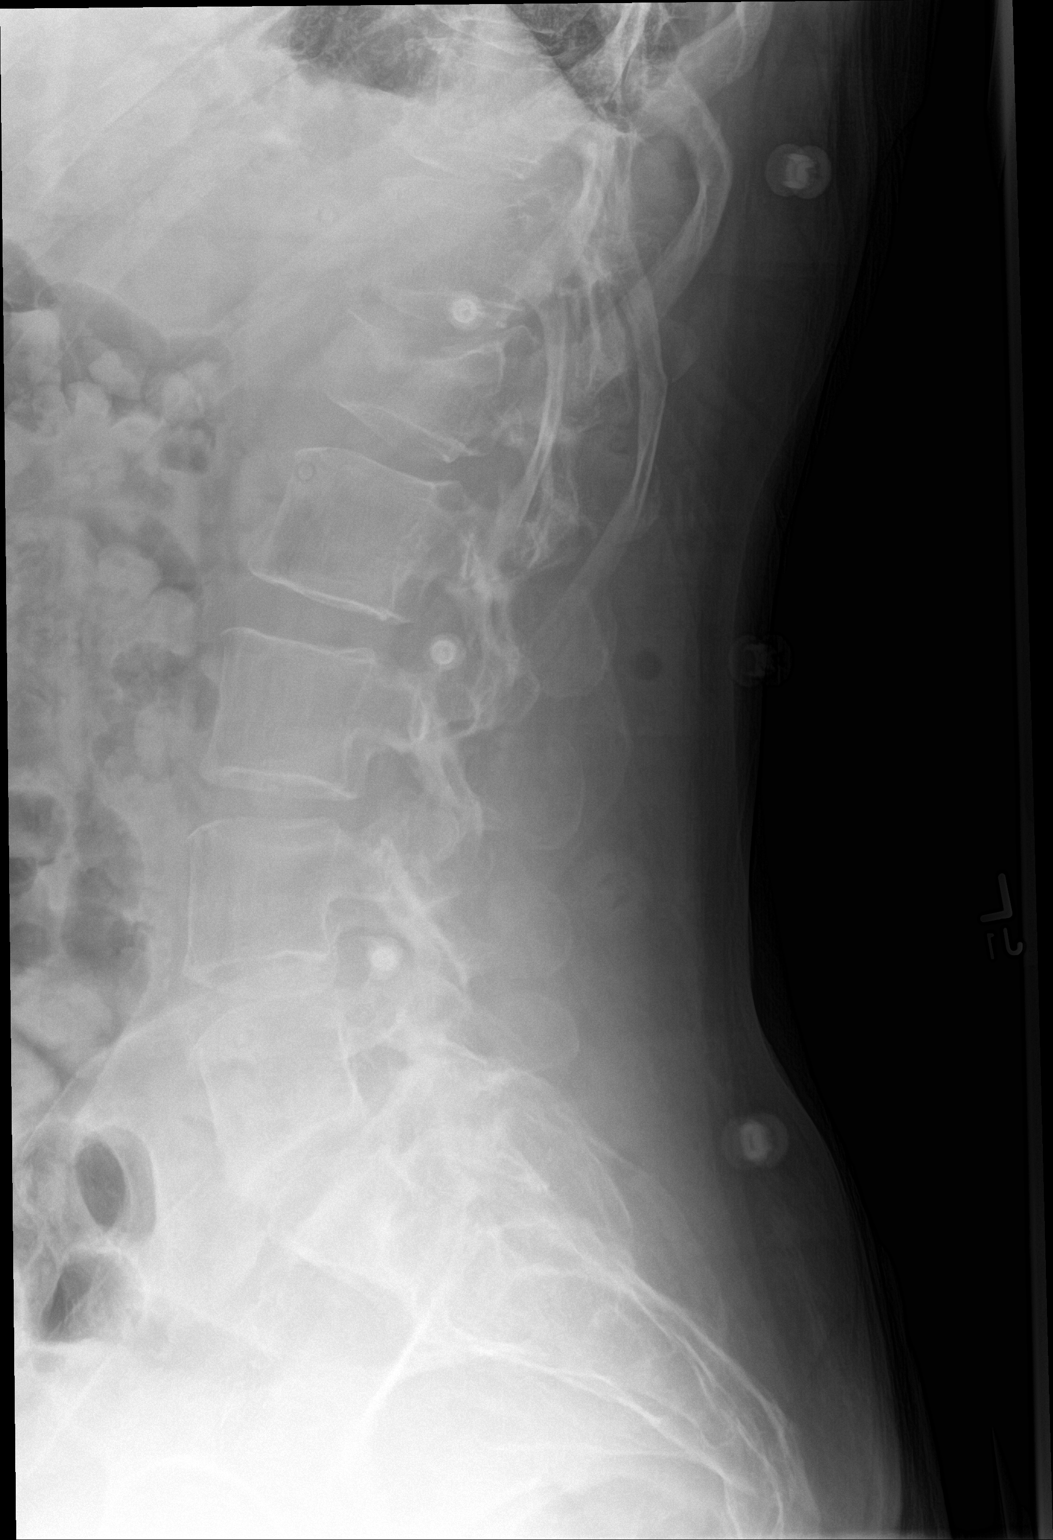

[l-spine ap]
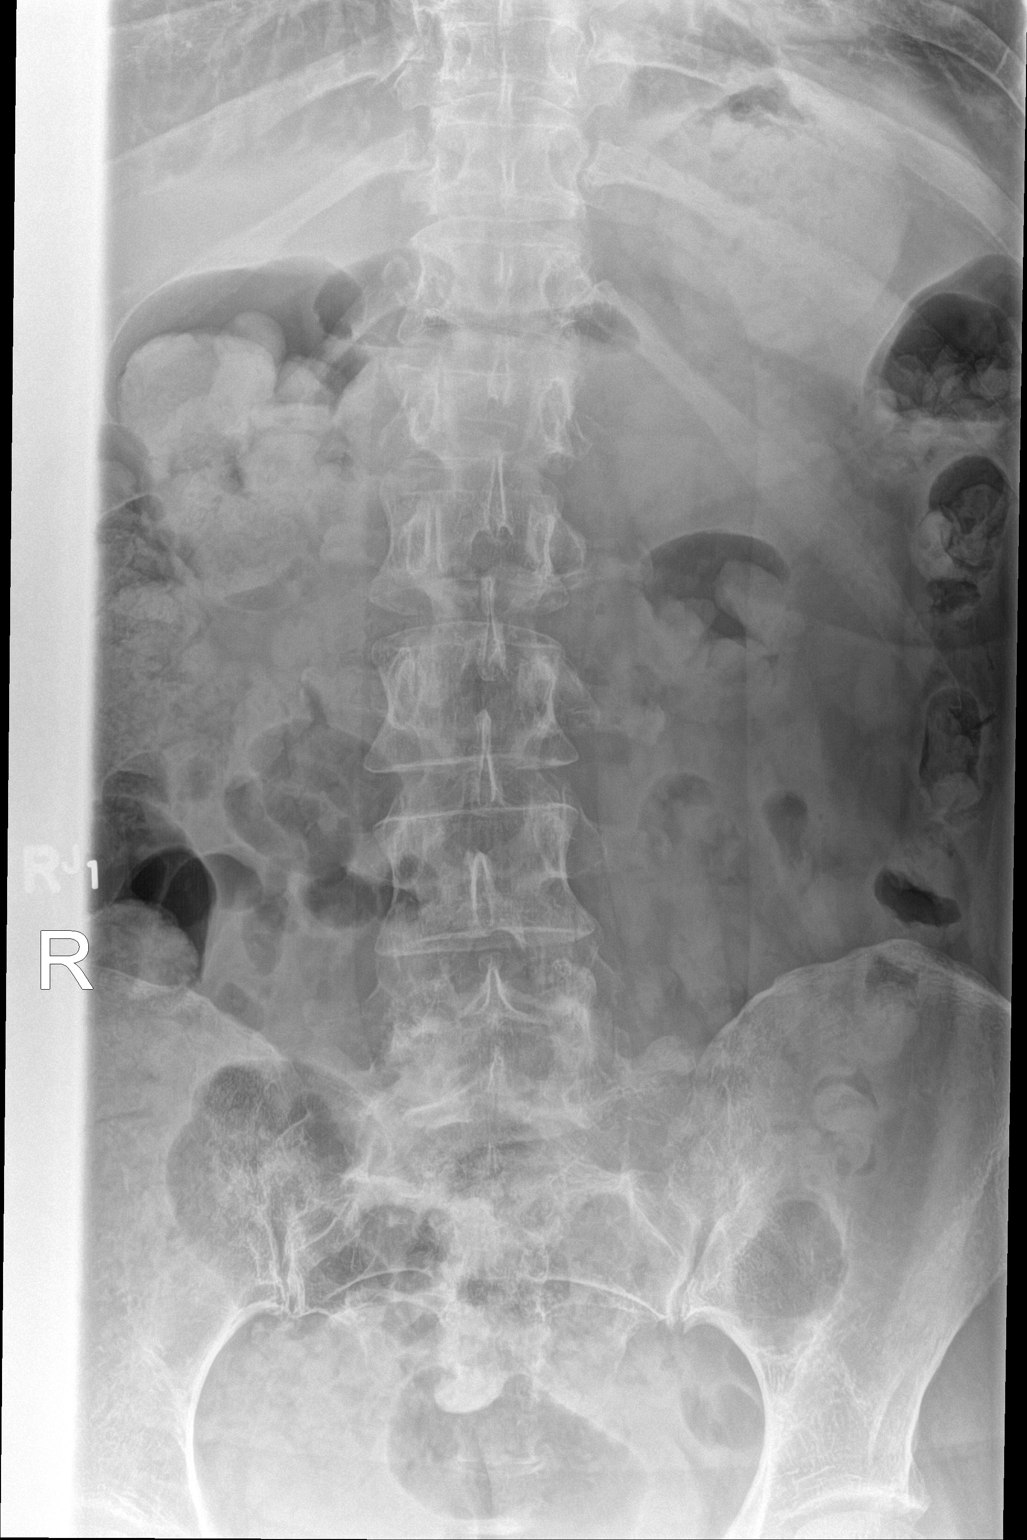

[2 of 2 positions shown; findings below may reference images not displayed]

FINDINGS: Normal alignment of the lumbar spine. Fracture deformity involving
the L1 vertebra is again identified. There is loss of approximately
30% of the vertebral body height. Mild retropulsion of the fracture
fragments noted. Degenerative disc disease is identified at the
L5-S1 level.
IMPRESSION: 1. Similar appearance of L1 compression fracture.

## 2016-07-24 ENCOUNTER — Other Ambulatory Visit: Payer: Self-pay | Admitting: Nurse Practitioner

## 2016-07-24 DIAGNOSIS — E1142 Type 2 diabetes mellitus with diabetic polyneuropathy: Secondary | ICD-10-CM

## 2016-08-03 ENCOUNTER — Other Ambulatory Visit: Payer: Self-pay | Admitting: Nurse Practitioner

## 2016-08-03 DIAGNOSIS — E1142 Type 2 diabetes mellitus with diabetic polyneuropathy: Secondary | ICD-10-CM

## 2017-07-22 ENCOUNTER — Ambulatory Visit: Payer: BLUE CROSS/BLUE SHIELD | Admitting: Internal Medicine

## 2017-07-22 ENCOUNTER — Ambulatory Visit: Payer: Self-pay | Admitting: Internal Medicine

## 2017-08-19 ENCOUNTER — Ambulatory Visit: Payer: Self-pay | Admitting: Internal Medicine

## 2018-07-22 ENCOUNTER — Emergency Department: Payer: Self-pay

## 2018-07-22 ENCOUNTER — Inpatient Hospital Stay: Payer: Self-pay

## 2018-07-22 ENCOUNTER — Inpatient Hospital Stay: Payer: Self-pay | Admitting: Anesthesiology

## 2018-07-22 ENCOUNTER — Inpatient Hospital Stay
Admission: EM | Admit: 2018-07-22 | Discharge: 2018-07-24 | DRG: 482 | Disposition: A | Discharge status: Home Health | Payer: Self-pay | Attending: Internal Medicine | Admitting: Internal Medicine

## 2018-07-22 ENCOUNTER — Other Ambulatory Visit: Payer: Self-pay

## 2018-07-22 ENCOUNTER — Encounter
Admission: EM | Disposition: A | Discharge status: Home Health | Payer: Self-pay | Source: Home / Self Care | Attending: Internal Medicine

## 2018-07-22 DIAGNOSIS — Z7951 Long term (current) use of inhaled steroids: Secondary | ICD-10-CM

## 2018-07-22 DIAGNOSIS — Z881 Allergy status to other antibiotic agents status: Secondary | ICD-10-CM

## 2018-07-22 DIAGNOSIS — Z818 Family history of other mental and behavioral disorders: Secondary | ICD-10-CM

## 2018-07-22 DIAGNOSIS — M62838 Other muscle spasm: Secondary | ICD-10-CM | POA: Diagnosis not present

## 2018-07-22 DIAGNOSIS — J449 Chronic obstructive pulmonary disease, unspecified: Secondary | ICD-10-CM | POA: Diagnosis present

## 2018-07-22 DIAGNOSIS — S72002A Fracture of unspecified part of neck of left femur, initial encounter for closed fracture: Secondary | ICD-10-CM

## 2018-07-22 DIAGNOSIS — F419 Anxiety disorder, unspecified: Secondary | ICD-10-CM | POA: Diagnosis present

## 2018-07-22 DIAGNOSIS — F329 Major depressive disorder, single episode, unspecified: Secondary | ICD-10-CM | POA: Diagnosis present

## 2018-07-22 DIAGNOSIS — Z8249 Family history of ischemic heart disease and other diseases of the circulatory system: Secondary | ICD-10-CM

## 2018-07-22 DIAGNOSIS — K589 Irritable bowel syndrome without diarrhea: Secondary | ICD-10-CM | POA: Diagnosis present

## 2018-07-22 DIAGNOSIS — W010XXA Fall on same level from slipping, tripping and stumbling without subsequent striking against object, initial encounter: Secondary | ICD-10-CM | POA: Diagnosis present

## 2018-07-22 DIAGNOSIS — Z885 Allergy status to narcotic agent status: Secondary | ICD-10-CM

## 2018-07-22 DIAGNOSIS — E1142 Type 2 diabetes mellitus with diabetic polyneuropathy: Secondary | ICD-10-CM | POA: Diagnosis present

## 2018-07-22 DIAGNOSIS — K219 Gastro-esophageal reflux disease without esophagitis: Secondary | ICD-10-CM | POA: Diagnosis present

## 2018-07-22 DIAGNOSIS — Z7984 Long term (current) use of oral hypoglycemic drugs: Secondary | ICD-10-CM

## 2018-07-22 DIAGNOSIS — Z87820 Personal history of traumatic brain injury: Secondary | ICD-10-CM

## 2018-07-22 DIAGNOSIS — Z79891 Long term (current) use of opiate analgesic: Secondary | ICD-10-CM

## 2018-07-22 DIAGNOSIS — Z882 Allergy status to sulfonamides status: Secondary | ICD-10-CM

## 2018-07-22 DIAGNOSIS — M25552 Pain in left hip: Secondary | ICD-10-CM

## 2018-07-22 DIAGNOSIS — Z79899 Other long term (current) drug therapy: Secondary | ICD-10-CM

## 2018-07-22 DIAGNOSIS — F17210 Nicotine dependence, cigarettes, uncomplicated: Secondary | ICD-10-CM | POA: Diagnosis present

## 2018-07-22 DIAGNOSIS — S72142A Displaced intertrochanteric fracture of left femur, initial encounter for closed fracture: Principal | ICD-10-CM | POA: Diagnosis present

## 2018-07-22 DIAGNOSIS — E785 Hyperlipidemia, unspecified: Secondary | ICD-10-CM | POA: Diagnosis present

## 2018-07-22 DIAGNOSIS — I10 Essential (primary) hypertension: Secondary | ICD-10-CM | POA: Diagnosis present

## 2018-07-22 HISTORY — PX: INTRAMEDULLARY (IM) NAIL INTERTROCHANTERIC: SHX5875

## 2018-07-22 LAB — GLUCOSE, CAPILLARY
Glucose-Capillary: 98 mg/dL (ref 70–99)
Glucose-Capillary: 117 mg/dL — ABNORMAL HIGH (ref 70–99)
Glucose-Capillary: 114 mg/dL — ABNORMAL HIGH (ref 70–99)
Glucose-Capillary: 114 mg/dL — ABNORMAL HIGH (ref 70–99)

## 2018-07-22 LAB — HEMOGLOBIN A1C
HEMOGLOBIN A1C: 5.8 % — AB (ref 4.8–5.6)
Mean Plasma Glucose: 119.76 mg/dL

## 2018-07-22 LAB — CBC
HCT: 39.7 % (ref 36.0–46.0)
Hemoglobin: 12.8 g/dL (ref 12.0–15.0)
MCH: 30 pg (ref 26.0–34.0)
MCHC: 32.2 g/dL (ref 30.0–36.0)
MCV: 93.2 fL (ref 80.0–100.0)
PLATELETS: 354 10*3/uL (ref 150–400)
RBC: 4.26 MIL/uL (ref 3.87–5.11)
RDW: 11.8 % (ref 11.5–15.5)
WBC: 9.1 10*3/uL (ref 4.0–10.5)
nRBC: 0 % (ref 0.0–0.2)

## 2018-07-22 LAB — COMPREHENSIVE METABOLIC PANEL
ALT: 13 U/L (ref 0–44)
AST: 25 U/L (ref 15–41)
Albumin: 4.5 g/dL (ref 3.5–5.0)
Alkaline Phosphatase: 97 U/L (ref 38–126)
Anion gap: 12 (ref 5–15)
BILIRUBIN TOTAL: 0.5 mg/dL (ref 0.3–1.2)
BUN: 24 mg/dL — ABNORMAL HIGH (ref 6–20)
CALCIUM: 10.3 mg/dL (ref 8.9–10.3)
CHLORIDE: 101 mmol/L (ref 98–111)
CO2: 27 mmol/L (ref 22–32)
CREATININE: 0.73 mg/dL (ref 0.44–1.00)
Glucose, Bld: 81 mg/dL (ref 70–99)
Potassium: 3.4 mmol/L — ABNORMAL LOW (ref 3.5–5.1)
Sodium: 140 mmol/L (ref 135–145)
TOTAL PROTEIN: 7.8 g/dL (ref 6.5–8.1)

## 2018-07-22 LAB — SURGICAL PCR SCREEN
MRSA, PCR: NEGATIVE
Staphylococcus aureus: NEGATIVE

## 2018-07-22 SURGERY — FIXATION, FRACTURE, INTERTROCHANTERIC, WITH INTRAMEDULLARY ROD
Anesthesia: Spinal | Laterality: Left

## 2018-07-22 MED ORDER — OXYCODONE HCL 5 MG PO TABS: 5.0000 mg | ORAL_TABLET | Freq: Once | ORAL | Status: DC | PRN

## 2018-07-22 MED ORDER — FENTANYL CITRATE (PF) 100 MCG/2ML IJ SOLN
100.0000 ug | Freq: Once | INTRAMUSCULAR | Status: AC
  Administered 2018-07-22: 100 ug via INTRAVENOUS
  Filled 2018-07-22: qty 2

## 2018-07-22 MED ORDER — MUPIROCIN 2 % EX OINT
1.0000 "application " | TOPICAL_OINTMENT | Freq: Two times a day (BID) | CUTANEOUS | Status: DC
  Filled 2018-07-22: qty 22

## 2018-07-22 MED ORDER — KETAMINE HCL 10 MG/ML IJ SOLN
INTRAMUSCULAR | Status: DC | PRN
  Administered 2018-07-22: 30 mg via INTRAVENOUS

## 2018-07-22 MED ORDER — ONDANSETRON HCL 4 MG/2ML IJ SOLN
4.0000 mg | Freq: Once | INTRAMUSCULAR | Status: AC
  Administered 2018-07-22: 4 mg via INTRAVENOUS
  Filled 2018-07-22: qty 2

## 2018-07-22 MED ORDER — ONDANSETRON HCL 4 MG/2ML IJ SOLN
4.0000 mg | Freq: Four times a day (QID) | INTRAMUSCULAR | Status: DC | PRN
  Filled 2018-07-22: qty 2

## 2018-07-22 MED ORDER — DOCUSATE SODIUM 100 MG PO CAPS
100.0000 mg | ORAL_CAPSULE | Freq: Two times a day (BID) | ORAL | Status: DC
  Administered 2018-07-22 – 2018-07-24 (×4): 100 mg via ORAL
  Filled 2018-07-22 (×4): qty 1

## 2018-07-22 MED ORDER — LORAZEPAM 2 MG/ML IJ SOLN
0.5000 mg | INTRAMUSCULAR | Status: AC
  Administered 2018-07-22: 0.5 mg via INTRAVENOUS
  Filled 2018-07-22: qty 1

## 2018-07-22 MED ORDER — INSULIN ASPART 100 UNIT/ML ~~LOC~~ SOLN: 0.0000 [IU] | SUBCUTANEOUS | Status: DC

## 2018-07-22 MED ORDER — OXYCODONE HCL 5 MG PO TABS: 5.0000 mg | ORAL_TABLET | ORAL | Status: DC | PRN

## 2018-07-22 MED ORDER — NEOMYCIN-POLYMYXIN B GU 40-200000 IR SOLN
Status: DC | PRN
  Administered 2018-07-22: 2 mL

## 2018-07-22 MED ORDER — SODIUM CHLORIDE 0.9 % IV SOLN
INTRAVENOUS | Status: DC
  Administered 2018-07-22: 20:00:00 via INTRAVENOUS

## 2018-07-22 MED ORDER — OXYCODONE HCL 5 MG/5ML PO SOLN: 5.0000 mg | Freq: Once | ORAL | Status: DC | PRN

## 2018-07-22 MED ORDER — ALBUTEROL SULFATE (2.5 MG/3ML) 0.083% IN NEBU: 2.5000 mg | INHALATION_SOLUTION | RESPIRATORY_TRACT | Status: DC | PRN

## 2018-07-22 MED ORDER — ENOXAPARIN SODIUM 40 MG/0.4ML ~~LOC~~ SOLN
40.0000 mg | SUBCUTANEOUS | Status: DC
  Administered 2018-07-23 – 2018-07-24 (×2): 40 mg via SUBCUTANEOUS
  Filled 2018-07-22 (×2): qty 0.4

## 2018-07-22 MED ORDER — FENTANYL CITRATE (PF) 250 MCG/5ML IJ SOLN
INTRAMUSCULAR | Status: AC
  Filled 2018-07-22: qty 5

## 2018-07-22 MED ORDER — PROPOFOL 500 MG/50ML IV EMUL
INTRAVENOUS | Status: AC
  Filled 2018-07-22: qty 50

## 2018-07-22 MED ORDER — HYDROCHLOROTHIAZIDE 25 MG PO TABS
25.0000 mg | ORAL_TABLET | Freq: Every day | ORAL | Status: DC
  Administered 2018-07-22: 25 mg via ORAL
  Filled 2018-07-22: qty 1

## 2018-07-22 MED ORDER — CEFAZOLIN SODIUM-DEXTROSE 2-4 GM/100ML-% IV SOLN
2.0000 g | Freq: Four times a day (QID) | INTRAVENOUS | Status: AC
  Administered 2018-07-22 – 2018-07-23 (×3): 2 g via INTRAVENOUS
  Filled 2018-07-22 (×3): qty 100

## 2018-07-22 MED ORDER — FENTANYL CITRATE (PF) 100 MCG/2ML IJ SOLN: 25.0000 ug | INTRAMUSCULAR | Status: DC | PRN

## 2018-07-22 MED ORDER — HYDROMORPHONE HCL 1 MG/ML IJ SOLN
0.5000 mg | INTRAMUSCULAR | Status: DC | PRN
  Administered 2018-07-24: 0.5 mg via INTRAVENOUS
  Administered 2018-07-24: 1 mg via INTRAVENOUS
  Filled 2018-07-22 (×2): qty 1

## 2018-07-22 MED ORDER — METFORMIN HCL 500 MG PO TABS
1000.0000 mg | ORAL_TABLET | Freq: Two times a day (BID) | ORAL | Status: DC
  Administered 2018-07-23 – 2018-07-24 (×3): 1000 mg via ORAL
  Filled 2018-07-22 (×4): qty 2

## 2018-07-22 MED ORDER — KETAMINE HCL 50 MG/ML IJ SOLN
INTRAMUSCULAR | Status: AC
  Filled 2018-07-22: qty 10

## 2018-07-22 MED ORDER — VITAMIN D (ERGOCALCIFEROL) 1.25 MG (50000 UNIT) PO CAPS
100000.0000 [IU] | ORAL_CAPSULE | ORAL | Status: DC
  Administered 2018-07-24: 100000 [IU] via ORAL
  Filled 2018-07-22: qty 2

## 2018-07-22 MED ORDER — NEOMYCIN-POLYMYXIN B GU 40-200000 IR SOLN
Status: AC
  Filled 2018-07-22: qty 2

## 2018-07-22 MED ORDER — ATORVASTATIN CALCIUM 10 MG PO TABS
10.0000 mg | ORAL_TABLET | Freq: Every day | ORAL | Status: DC
  Administered 2018-07-22 – 2018-07-23 (×2): 10 mg via ORAL
  Filled 2018-07-22 (×2): qty 1

## 2018-07-22 MED ORDER — MIDAZOLAM HCL 2 MG/2ML IJ SOLN
INTRAMUSCULAR | Status: AC
  Filled 2018-07-22: qty 2

## 2018-07-22 MED ORDER — ONDANSETRON HCL 4 MG PO TABS: 4.0000 mg | ORAL_TABLET | Freq: Four times a day (QID) | ORAL | Status: DC | PRN

## 2018-07-22 MED ORDER — BISACODYL 10 MG RE SUPP
10.0000 mg | Freq: Every day | RECTAL | Status: DC | PRN
  Administered 2018-07-24: 10 mg via RECTAL
  Filled 2018-07-22: qty 1

## 2018-07-22 MED ORDER — PROPOFOL 500 MG/50ML IV EMUL
INTRAVENOUS | Status: DC | PRN
  Administered 2018-07-22: 100 ug/kg/min via INTRAVENOUS

## 2018-07-22 MED ORDER — BUPIVACAINE HCL (PF) 0.5 % IJ SOLN
INTRAMUSCULAR | Status: AC
  Filled 2018-07-22: qty 30

## 2018-07-22 MED ORDER — FENTANYL CITRATE (PF) 100 MCG/2ML IJ SOLN
INTRAMUSCULAR | Status: AC
  Filled 2018-07-22: qty 2

## 2018-07-22 MED ORDER — BUPIVACAINE-EPINEPHRINE (PF) 0.5% -1:200000 IJ SOLN
INTRAMUSCULAR | Status: DC | PRN
  Administered 2018-07-22: 30 mL

## 2018-07-22 MED ORDER — ACETAMINOPHEN 325 MG PO TABS: 650.0000 mg | ORAL_TABLET | Freq: Four times a day (QID) | ORAL | Status: DC | PRN

## 2018-07-22 MED ORDER — CITALOPRAM HYDROBROMIDE 20 MG PO TABS
40.0000 mg | ORAL_TABLET | Freq: Every day | ORAL | Status: DC
  Administered 2018-07-22 – 2018-07-24 (×3): 40 mg via ORAL
  Filled 2018-07-22 (×3): qty 2

## 2018-07-22 MED ORDER — CEFAZOLIN SODIUM-DEXTROSE 2-4 GM/100ML-% IV SOLN
2.0000 g | Freq: Once | INTRAVENOUS | Status: DC
  Filled 2018-07-22: qty 100

## 2018-07-22 MED ORDER — HYDRALAZINE HCL 20 MG/ML IJ SOLN: 10.0000 mg | Freq: Four times a day (QID) | INTRAMUSCULAR | Status: DC | PRN

## 2018-07-22 MED ORDER — POLYETHYLENE GLYCOL 3350 17 G PO PACK: 17.0000 g | PACK | Freq: Every day | ORAL | Status: DC | PRN

## 2018-07-22 MED ORDER — SODIUM CHLORIDE 0.9 % IV SOLN
INTRAVENOUS | Status: DC | PRN
  Administered 2018-07-22: 25 ug/min via INTRAVENOUS

## 2018-07-22 MED ORDER — TRAMADOL HCL 50 MG PO TABS: 50.0000 mg | ORAL_TABLET | Freq: Four times a day (QID) | ORAL | Status: DC | PRN

## 2018-07-22 MED ORDER — LOPERAMIDE HCL 2 MG PO CAPS
2.0000 mg | ORAL_CAPSULE | Freq: Three times a day (TID) | ORAL | Status: DC | PRN
  Filled 2018-07-22: qty 1

## 2018-07-22 MED ORDER — HYDROMORPHONE HCL 1 MG/ML IJ SOLN
1.0000 mg | INTRAMUSCULAR | Status: DC | PRN
  Filled 2018-07-22: qty 1

## 2018-07-22 MED ORDER — ACETAMINOPHEN 325 MG PO TABS
325.0000 mg | ORAL_TABLET | Freq: Four times a day (QID) | ORAL | Status: DC | PRN
  Administered 2018-07-23: 650 mg via ORAL
  Filled 2018-07-22: qty 2

## 2018-07-22 MED ORDER — ACETAMINOPHEN 500 MG PO TABS
1000.0000 mg | ORAL_TABLET | Freq: Four times a day (QID) | ORAL | Status: AC
  Administered 2018-07-22 – 2018-07-23 (×4): 1000 mg via ORAL
  Filled 2018-07-22 (×4): qty 2

## 2018-07-22 MED ORDER — RISAQUAD PO CAPS
1.0000 | ORAL_CAPSULE | Freq: Every day | ORAL | Status: DC
  Administered 2018-07-22 – 2018-07-24 (×3): 1 via ORAL
  Filled 2018-07-22 (×3): qty 1

## 2018-07-22 MED ORDER — OXYCODONE HCL 5 MG PO TABS
5.0000 mg | ORAL_TABLET | ORAL | Status: DC | PRN
  Administered 2018-07-22 – 2018-07-24 (×5): 10 mg via ORAL
  Filled 2018-07-22 (×2): qty 2
  Filled 2018-07-22: qty 1
  Filled 2018-07-22 (×2): qty 2

## 2018-07-22 MED ORDER — MIDAZOLAM HCL 5 MG/5ML IJ SOLN
INTRAMUSCULAR | Status: DC | PRN
  Administered 2018-07-22: 2 mg via INTRAVENOUS

## 2018-07-22 MED ORDER — ONDANSETRON HCL 4 MG PO TABS
4.0000 mg | ORAL_TABLET | Freq: Four times a day (QID) | ORAL | Status: DC | PRN
  Administered 2018-07-23: 4 mg via ORAL
  Filled 2018-07-22: qty 1

## 2018-07-22 MED ORDER — METOCLOPRAMIDE HCL 5 MG/ML IJ SOLN: 5.0000 mg | Freq: Three times a day (TID) | INTRAMUSCULAR | Status: DC | PRN

## 2018-07-22 MED ORDER — MUPIROCIN 2 % EX OINT
1.0000 "application " | TOPICAL_OINTMENT | Freq: Two times a day (BID) | CUTANEOUS | Status: DC | PRN
  Filled 2018-07-22: qty 22

## 2018-07-22 MED ORDER — SODIUM CHLORIDE 0.9 % IV SOLN
INTRAVENOUS | Status: DC
  Administered 2018-07-22: 14:00:00 via INTRAVENOUS

## 2018-07-22 MED ORDER — DIPHENHYDRAMINE HCL 12.5 MG/5ML PO ELIX: 12.5000 mg | ORAL_SOLUTION | ORAL | Status: DC | PRN

## 2018-07-22 MED ORDER — PROPOFOL 10 MG/ML IV BOLUS
INTRAVENOUS | Status: DC | PRN
  Administered 2018-07-22: 50 mg via INTRAVENOUS

## 2018-07-22 MED ORDER — EPINEPHRINE PF 1 MG/ML IJ SOLN
INTRAMUSCULAR | Status: AC
  Filled 2018-07-22: qty 1

## 2018-07-22 MED ORDER — TRIAMCINOLONE ACETONIDE 55 MCG/ACT NA AERO: 2.0000 | INHALATION_SPRAY | Freq: Every day | NASAL | Status: DC

## 2018-07-22 MED ORDER — KETOROLAC TROMETHAMINE 15 MG/ML IJ SOLN
15.0000 mg | Freq: Four times a day (QID) | INTRAMUSCULAR | Status: AC
  Administered 2018-07-22 – 2018-07-23 (×4): 15 mg via INTRAVENOUS
  Filled 2018-07-22 (×4): qty 1

## 2018-07-22 MED ORDER — MAGNESIUM HYDROXIDE 400 MG/5ML PO SUSP
30.0000 mL | Freq: Every day | ORAL | Status: DC | PRN
  Administered 2018-07-24: 30 mL via ORAL
  Filled 2018-07-22: qty 30

## 2018-07-22 MED ORDER — FENTANYL CITRATE (PF) 100 MCG/2ML IJ SOLN
INTRAMUSCULAR | Status: DC | PRN
  Administered 2018-07-22: 50 ug via INTRAVENOUS

## 2018-07-22 MED ORDER — PROPOFOL 10 MG/ML IV BOLUS
INTRAVENOUS | Status: AC
  Filled 2018-07-22: qty 20

## 2018-07-22 MED ORDER — PHENYLEPHRINE HCL 10 MG/ML IJ SOLN
INTRAMUSCULAR | Status: DC | PRN
  Administered 2018-07-22 (×6): 100 ug via INTRAVENOUS

## 2018-07-22 MED ORDER — CALCIUM ACETATE (PHOS BINDER) 667 MG PO CAPS
667.0000 mg | ORAL_CAPSULE | Freq: Three times a day (TID) | ORAL | Status: DC
  Administered 2018-07-23 – 2018-07-24 (×5): 667 mg via ORAL
  Filled 2018-07-22 (×6): qty 1

## 2018-07-22 MED ORDER — POTASSIUM CHLORIDE IN NACL 20-0.9 MEQ/L-% IV SOLN
INTRAVENOUS | Status: DC
  Filled 2018-07-22 (×2): qty 1000

## 2018-07-22 MED ORDER — ATENOLOL 25 MG PO TABS
25.0000 mg | ORAL_TABLET | Freq: Every day | ORAL | Status: DC
  Administered 2018-07-22: 25 mg via ORAL
  Filled 2018-07-22: qty 1

## 2018-07-22 MED ORDER — INFLUENZA VAC SPLIT QUAD 0.5 ML IM SUSY: 0.5000 mL | PREFILLED_SYRINGE | INTRAMUSCULAR | Status: DC

## 2018-07-22 MED ORDER — BACITRACIN-NEOMYCIN-POLYMYXIN OINTMENT TUBE
1.0000 "application " | TOPICAL_OINTMENT | Freq: Every day | CUTANEOUS | Status: DC | PRN
  Filled 2018-07-22: qty 14.17

## 2018-07-22 MED ORDER — PANTOPRAZOLE SODIUM 40 MG PO TBEC
40.0000 mg | DELAYED_RELEASE_TABLET | Freq: Every day | ORAL | Status: DC
  Administered 2018-07-22 – 2018-07-24 (×3): 40 mg via ORAL
  Filled 2018-07-22 (×3): qty 1

## 2018-07-22 MED ORDER — METOCLOPRAMIDE HCL 10 MG PO TABS: 5.0000 mg | ORAL_TABLET | Freq: Three times a day (TID) | ORAL | Status: DC | PRN

## 2018-07-22 MED ORDER — ONDANSETRON HCL 4 MG/2ML IJ SOLN
4.0000 mg | Freq: Four times a day (QID) | INTRAMUSCULAR | Status: DC | PRN
  Administered 2018-07-22: 4 mg via INTRAVENOUS
  Filled 2018-07-22: qty 2

## 2018-07-22 MED ORDER — ACETAMINOPHEN 650 MG RE SUPP: 650.0000 mg | Freq: Four times a day (QID) | RECTAL | Status: DC | PRN

## 2018-07-22 MED ORDER — CEFAZOLIN SODIUM-DEXTROSE 1-4 GM/50ML-% IV SOLN
INTRAVENOUS | Status: DC | PRN
  Administered 2018-07-22: 2 g via INTRAVENOUS

## 2018-07-22 MED ORDER — FLEET ENEMA 7-19 GM/118ML RE ENEM: 1.0000 | ENEMA | Freq: Once | RECTAL | Status: DC | PRN

## 2018-07-22 MED ORDER — HYDROMORPHONE HCL 1 MG/ML IJ SOLN
0.5000 mg | INTRAMUSCULAR | Status: DC | PRN
  Administered 2018-07-22: 0.5 mg via INTRAVENOUS
  Filled 2018-07-22: qty 1

## 2018-07-22 MED ORDER — FLUTICASONE PROPIONATE 50 MCG/ACT NA SUSP
2.0000 | Freq: Every day | NASAL | Status: DC | PRN
  Filled 2018-07-22: qty 16

## 2018-07-22 SURGICAL SUPPLY — 43 items
BIT DRILL 4.3MMS DISTAL GRDTED (BIT) IMPLANT
BNDG COHESIVE 4X5 TAN STRL (GAUZE/BANDAGES/DRESSINGS) ×3 IMPLANT
BNDG COHESIVE 6X5 TAN STRL LF (GAUZE/BANDAGES/DRESSINGS) ×3 IMPLANT
CANISTER SUCT 1200ML W/VALVE (MISCELLANEOUS) ×3 IMPLANT
CHLORAPREP W/TINT 26ML (MISCELLANEOUS) ×6 IMPLANT
DRAPE C-ARMOR (DRAPES) ×3 IMPLANT
DRAPE SHEET LG 3/4 BI-LAMINATE (DRAPES) ×3 IMPLANT
DRESSING ALLEVYN LIFE SACRUM (GAUZE/BANDAGES/DRESSINGS) ×2 IMPLANT
DRILL 4.3MMS DISTAL GRADUATED (BIT) ×3
DRSG OPSITE POSTOP 3X4 (GAUZE/BANDAGES/DRESSINGS) ×7 IMPLANT
DRSG OPSITE POSTOP 4X6 (GAUZE/BANDAGES/DRESSINGS) ×3 IMPLANT
ELECT CAUTERY BLADE 6.4 (BLADE) ×3 IMPLANT
ELECT REM PT RETURN 9FT ADLT (ELECTROSURGICAL) ×3
ELECTRODE REM PT RTRN 9FT ADLT (ELECTROSURGICAL) ×1 IMPLANT
GAUZE SPONGE 4X4 12PLY STRL (GAUZE/BANDAGES/DRESSINGS) ×3 IMPLANT
GLOVE BIO SURGEON STRL SZ8 (GLOVE) ×6 IMPLANT
GLOVE INDICATOR 8.0 STRL GRN (GLOVE) ×3 IMPLANT
GOWN STRL REUS W/ TWL LRG LVL3 (GOWN DISPOSABLE) ×1 IMPLANT
GOWN STRL REUS W/ TWL XL LVL3 (GOWN DISPOSABLE) ×1 IMPLANT
GOWN STRL REUS W/TWL LRG LVL3 (GOWN DISPOSABLE) ×3
GOWN STRL REUS W/TWL XL LVL3 (GOWN DISPOSABLE) ×3
GUIDEPIN VERSANAIL DSP 3.2X444 (ORTHOPEDIC DISPOSABLE SUPPLIES) ×2 IMPLANT
GUIDEWIRE BALL NOSE 80CM (WIRE) ×2 IMPLANT
HFN LH 130 DEG 11MM X 380MM (Orthopedic Implant) ×2 IMPLANT
MAT ABSORB  FLUID 56X50 GRAY (MISCELLANEOUS) ×2
MAT ABSORB FLUID 56X50 GRAY (MISCELLANEOUS) ×1 IMPLANT
NDL FILTER BLUNT 18X1 1/2 (NEEDLE) ×1 IMPLANT
NDL SAFETY 18GX1.5 (NEEDLE) ×2 IMPLANT
NEEDLE FILTER BLUNT 18X 1/2SAF (NEEDLE) ×2
NEEDLE FILTER BLUNT 18X1 1/2 (NEEDLE) ×1 IMPLANT
NEEDLE HYPO 22GX1.5 SAFETY (NEEDLE) ×3 IMPLANT
NS IRRIG 500ML POUR BTL (IV SOLUTION) ×3 IMPLANT
PACK HIP COMPR (MISCELLANEOUS) ×3 IMPLANT
SCREW BONE CORTICAL 5.0X40 (Screw) ×2 IMPLANT
SCREW LAG HIP NAIL 10.5X95 (Screw) ×2 IMPLANT
STAPLER SKIN PROX 35W (STAPLE) ×3 IMPLANT
STRAP SAFETY 5IN WIDE (MISCELLANEOUS) ×3 IMPLANT
SUT VIC AB 0 CT1 36 (SUTURE) ×3 IMPLANT
SUT VIC AB 1 CT1 36 (SUTURE) ×3 IMPLANT
SUT VIC AB 2-0 CT1 (SUTURE) ×6 IMPLANT
SYR 10ML LL (SYRINGE) ×3 IMPLANT
SYR 30ML LL (SYRINGE) ×3 IMPLANT
TAPE MICROFOAM 4IN (TAPE) ×3 IMPLANT

## 2018-07-22 NOTE — Clinical Social Work Note (Signed)
Clinical Social Work Assessment  Patient Details  Name: Denise Webb MRN: [id number] Date of Birth: [birth year]  Date of referral:  07/22/18               Reason for consult:  [employer], Discharge Planning                Permission sought to share information with:  Case Manager Permission granted to share information::  Yes, Verbal Permission Granted  Name::        Agency::     Relationship::     Contact Information:     Housing/Transportation Living arrangements for the past 2 months:  Single Family Home Source of Information:  Patient, Other (Comment Required)(Sister in Tax inspector ) Patient Interpreter Needed:  None Criminal Activity/Legal Involvement Pertinent to Current Situation/Hospitalization:  No - Comment as needed Significant Relationships:  Adult Children, Spouse, Other(Comment)(Sister in law. ) Lives with:  Spouse Do you feel safe going back to the place where you live?  Yes Need for family participation in patient care:  Yes (Comment)  Care giving concerns:  Patient lives in [place] with her husband Ronalee Belts.    Social Worker assessment / plan:  Holiday representative (CSW) reviewed chart and noted that patient has a femur fracture. Surgery and PT are pending. CSW met with patient and her sister in law Lenna Sciara was at bedside. Patient was alert and oriented X4 and was laying in the bed. CSW introduced self and explained role of CSW department. Per patient she lives in [place] with her husband and currently has no health insurance. Per patient her medicaid should be fixed on Monday and reinstated. Per patient she lost medicaid but should have it back Monday. Per patient she does not receive a SSI or disability check. CSW explained that after surgery PT will work with her. CSW explained that her options for discharge services are going to be limited because she has no insurance. CSW explained that SNF will likely not be an option because she has no insurance at  this time. Per patient's sister in law Melissa she use to work in a nursing home and can provide care for patient at home. CSW explained that RN case manager may be able to arrange charity home health care. Patient and sister in law verbalized their understanding. RN case manager aware of above. CSW will continue to follow and assist as needed.   Employment status:  Unemployed Forensic scientist:  Self Pay (Medicaid Pending) PT Recommendations:  Not assessed at this time Information / Referral to community resources:  Other (Comment Required)(Patient has no payer for SNF. )  Patient/Family's Response to care:  Patient is agreeable to surgery and PT at [facility].   Patient/Family's Understanding of and Emotional Response to Diagnosis, Current Treatment, and Prognosis:  Patient and her sister in law were very pleasant and thanked CSW for assistance.   Emotional Assessment Appearance:  Appears stated age Attitude/Demeanor/Rapport:    Affect (typically observed):  Accepting, Adaptable, Pleasant Orientation:  Oriented to Self, Oriented to Place, Oriented to  Time, Oriented to Situation Alcohol / Substance use:  Not Applicable Psych involvement (Current and /or in the community):  No (Comment)  Discharge Needs  Concerns to be addressed:  Discharge Planning Concerns Readmission within the last 30 days:  No Current discharge risk:  Dependent with Mobility Barriers to Discharge:  Continued Medical Work up   [employer], Veronia Beets, LCSW 07/22/2018, 3:47 PM

## 2018-07-22 NOTE — ED Provider Notes (Signed)
[facility] Emergency Department Provider Note _________________________   First MD Initiated Contact with Patient 07/22/18 0636     (approximate)  I have reviewed the triage vital signs and the nursing notes.   HISTORY  Chief Complaint Fall    HPI Denise Webb is a 52 y.o. female with below list of chronic medical conditions including right iliac wing fracture presents to the emergency department with history of accidental slip and fall this morning with resultant left hip pain that is currently 8 out of 10.  Patient denies any loss of consciousness.  Patient states that pain is worse with any movement.  Patient denies any back pain.   Past Medical History:  Diagnosis Date  . Abdominal pain, unspecified site 09/19/2013  . Acute blood loss anemia 12/2015  . Anxiety   . Bilateral great toes ulcers ([place])   . Colitis   . Depression   . Diabetes mellitus without complication ([place])    type 2   . Diabetic peripheral neuropathy associated with type 2 diabetes mellitus ([place]) 06/24/2015  . Fracture of right iliac wing ([place]) 01/27/2016  . GERD (gastroesophageal reflux disease)   . History of head injury 06/25/2015  . Hyperlipidemia   . Hypertension   . IBS (irritable bowel syndrome) 2011-2014   ER visits only   . Internal hemorrhoids   . Lumbar radicular pain (Bilateral) (R>L) (Right L5 Dermatome) 10/09/2014   On the right leg to pain goes to the top of the foot and big toe following an L5 dermatomal distribution. On the left side it does not go all the way into the foot.   . Neuropathy (HCC)    severe both feet/lower legs  . Plantar fasciitis   . Sternal fracture 12/2015  . TMJ (dislocation of temporomandibular joint)   . Trichomoniasis     Patient Active Problem List   Diagnosis Date Noted  . Bilateral great toes ulcers ([place]) 10/31/2015  . Substance use disorder (abnormal UDS on 09/19/2015) 10/03/2015  . Pain medication agreement broken (see  09/19/2015 UDS) 10/03/2015  . Chronic lower extremity pain (Location of Primary Source of Pain) (Bilateral) (L>R) 09/19/2015  . Chronic lumbar radicular pain (Bilateral) (R>L) (Right L5 Dermatome) 09/19/2015  . Osteoarthritis 09/19/2015  . Lumbar radiculopathy (Bilateral) (EMG performed on 06/18/2014) 09/19/2015  . Nicotine dependence 07/09/2015  . Opiate dependence ([place]) 06/25/2015  . History of migraine 06/25/2015  . Sleep apnea 06/25/2015  . GERD (gastroesophageal reflux disease) 06/25/2015  . Hiatal hernia 06/25/2015  . Lumbar facet arthropathy (L2-3, L3-4, L4-5, and L5-S1) 06/25/2015  . Lumbar disc herniation (L5-S1) 06/25/2015  . Cervical disc bulge (Left) (C5-6) 06/25/2015  . Chronic neck pain 06/25/2015  . Cervical spondylosis 06/25/2015  . Lumbar spondylosis 06/25/2015  . Chronic pain 06/24/2015  . Long term current use of opiate analgesic 06/24/2015  . Opiate use (150 MME/Day) 06/24/2015  . Chronic low back pain (Location of Tertiary source of pain) (Bilateral) (R>L) 06/24/2015  . Lumbar facet syndrome (Bilateral) (R>L) 06/24/2015  . Generalized anxiety disorder 10/09/2014  . Hyperlipidemia LDL goal <100 05/30/2014  . Insomnia 05/30/2014  . Depression 01/09/2014  . Other and unspecified hyperlipidemia 10/05/2013  . DM type 2, uncontrolled, with neuropathy ([place]) 10/04/2013  . Irritable bowel syndrome 03/21/2010  . HYPERTRIGLYCERIDEMIA 05/02/2009  . ANXIETY DEPRESSION 05/02/2009  . Tobacco abuse 05/02/2009  . Essential hypertension, benign 05/02/2009    Past Surgical History:  Procedure Laterality Date  . [place]  [place]  . PELVIC EXENTERATION  1990   Uterus Frozen (cancer)  . TUBAL LIGATION    . tubes tied  2004/2005   [place]    Prior to Admission medications   Medication Sig Start Date End Date Taking? Authorizing Provider  ALPRAZolam Duanne Moron) 1 MG tablet Take one tablet by mouth three times daily as needed for anxiety 03/02/16   Lauree Chandler, NP  aspirin-acetaminophen-caffeine (EXCEDRIN MIGRAINE) [phone] MG per tablet Take by mouth every 6 (six) hours as needed for headache.    [provider]  atenolol (TENORMIN) 25 MG tablet TAKE 1 TABLET BY MOUTH EVERY DAY 03/11/15   Reed, Tiffany L, DO  atorvastatin (LIPITOR) 10 MG tablet TAKE 1 TABLET (10 MG TOTAL) BY MOUTH DAILY. 03/11/15   Gildardo Cranker, DO  Calcium Acetate, Phos Binder, (CALCIUM ACETATE PO) Take by mouth. By mouth every other day.    [provider]  citalopram (CELEXA) 40 MG tablet Take 1 tablet (40 mg total) by mouth daily. 02/27/16   Lauree Chandler, NP  gabapentin (NEURONTIN) 300 MG capsule Take 1 capsule (300 mg total) by mouth every 6 (six) hours as needed. 02/03/16   Lauree Chandler, NP  gabapentin (NEURONTIN) 600 MG tablet Take 1 tablet (600 mg total) by mouth every 6 (six) hours. 02/03/16   Lauree Chandler, NP  glucose blood test strip 1 each by Other route as needed for other. Reli on prime: check blood sugar twice daily DX: 250.62    [provider]  hydrochlorothiazide (HYDRODIURIL) 25 MG tablet TAKE 1 TABLET (25 MG TOTAL) BY MOUTH DAILY. 03/11/15   Gildardo Cranker, DO  Lactobacillus (ACIDOPHILUS PROBIOTIC) 100 MG CAPS Take 1 capsule (100 mg total) by mouth daily. 05/28/11   Waldemar Dickens, MD  loperamide (IMODIUM A-D) 2 MG tablet Take 2 mg by mouth 3 (three) times daily as needed. Take 2 tabs by mouth once daily as needed for diarrhea. 02/19/12   Waldemar Dickens, MD  metFORMIN (GLUCOPHAGE) 500 MG tablet TAKE 1 TABLET (500 MG TOTAL) BY MOUTH 2 (TWO) TIMES DAILY WITH A MEAL. 03/11/15   Reed, Tiffany L, DO  mupirocin ointment (BACTROBAN) 2 % Apply 1 application topically 2 (two) times daily. 10/29/15   Trula Slade, DPM  naproxen (NAPROSYN) 500 MG tablet Take 1 tablet (500 mg total) by mouth 2 (two) times daily with a meal. 01/30/16   Lisette Abu, PA-C  neomycin-bacitracin-polymyxin (NEOSPORIN) [phone] ointment Apply  daily to sores on feet 01/16/14   Estill Dooms, MD  omeprazole (PRILOSEC) 40 MG capsule Take 1 capsule (40 mg total) by mouth daily. 01/15/16   Gildardo Cranker, DO  Oxycodone HCl 20 MG TABS 1 by mouth four times daily 02/24/16   Lauree Chandler, NP  oxyCODONE-acetaminophen (PERCOCET) 10-325 MG tablet Take 1-2 tablets by mouth every 4 (four) hours as needed for pain. 02/06/16   Lauree Chandler, NP  traMADol (ULTRAM) 50 MG tablet Take 2 tablets (100 mg total) by mouth every 6 (six) hours. 01/30/16   Lisette Abu, PA-C  triamcinolone (NASACORT AQ) 55 MCG/ACT AERO nasal inhaler 2 sprays in each nostril daily 01/16/14   Estill Dooms, MD    Allergies Sulfur; Erythromycin stearate; and Morphine and related  Family History  Problem Relation Age of Onset  . Heart disease Mother   . Heart attack Mother   . Heart attack Father   . Cancer Brother  colon cancer  . Colitis Brother   . Cancer Brother 28       lung cancer  . Heart disease Brother   . Anxiety disorder Brother   . Heart attack Other     Social History Social History   Tobacco Use  . Smoking status: Current Every Day Smoker    Packs/day: 1.00    Types: Cigarettes  . Smokeless tobacco: Never Used  Substance Use Topics  . Alcohol use: Yes    Alcohol/week: 1.0 standard drinks    Types: 1 Glasses of wine per week    Comment: on weekend occassionally  "   12/2015 Once or twice a week "  . Drug use: No    Review of Systems Constitutional: No fever/chills Eyes: No visual changes. ENT: No sore throat. Cardiovascular: Denies chest pain. Respiratory: Denies shortness of breath. Gastrointestinal: No abdominal pain.  No nausea, no vomiting.  No diarrhea.  No constipation. Genitourinary: Negative for dysuria. Musculoskeletal: Negative for neck pain.  Negative for back pain.  Positive for left hip pain Integumentary: Negative for rash. Neurological: Negative for headaches, focal weakness or  numbness.   ____________________________________________   PHYSICAL EXAM:  VITAL SIGNS: ED Triage Vitals  Enc Vitals Group     BP 07/22/18 0628 135/82     Pulse Rate 07/22/18 0628 (!) 110     Resp 07/22/18 0628 19     Temp 07/22/18 0628 97.7 F (36.5 C)     Temp Source 07/22/18 0628 Oral     SpO2 07/22/18 0628 100 %     Weight 07/22/18 0631 66.7 kg (147 lb)     Height 07/22/18 0631 1.727 m (5\' 8" )     Head Circumference --      Peak Flow --      Pain Score 07/22/18 0631 7     Pain Loc --      Pain Edu? --      Excl. in [place]? --     Constitutional: Alert and oriented.  Apparent discomfort  eyes: Conjunctivae are normal.  Head: Atraumatic. Mouth/Throat: Mucous membranes are moist. Oropharynx non-erythematous. Neck: No stridor.  No cervical spine tenderness to palpation. Cardiovascular: Normal rate, regular rhythm. Good peripheral circulation. Grossly normal heart sounds. Respiratory: Normal respiratory effort.  No retractions. Lungs CTAB. Gastrointestinal: Soft and nontender. No distention.  Musculoskeletal: No lower extremity tenderness nor edema.  Left hip pain with gentle palpation. Neurologic:  Normal speech and language. No gross focal neurologic deficits are appreciated.  Skin:  Skin is warm, dry and intact. No rash noted. Psychiatric: Mood and affect are normal. Speech and behavior are normal.  ____________________________________________   LABS (all labs ordered are listed, but only abnormal results are displayed)  Labs Reviewed  COMPREHENSIVE METABOLIC PANEL - Abnormal; Notable for the following components:      Result Value   Potassium 3.4 (*)    BUN 24 (*)    All other components within normal limits  CBC     Procedures   ____________________________________________   INITIAL IMPRESSION / ASSESSMENT AND PLAN / ED COURSE  As part of my medical decision making, I reviewed the following data within the electronic MEDICAL RECORD NUMBER  52 year old  female presenting with above-stated history and physical exam secondary to accidental fall with resultant left hip pain.  Concern for possible pelvis versus hip fracture and as such x-ray pending at this time.  Patient's care transferred to Dr. Quentin Cornwall. ____________________________________________  FINAL CLINICAL IMPRESSION(S) / ED  DIAGNOSES  Final diagnoses:  Left hip pain     MEDICATIONS GIVEN DURING THIS VISIT:  Medications  ondansetron (ZOFRAN) injection 4 mg (has no administration in time range)  fentaNYL (SUBLIMAZE) injection 100 mcg (has no administration in time range)     ED Discharge Orders    None       Note:  This document was prepared using Dragon voice recognition software and may include unintentional dictation errors.    Gregor Hams, MD 07/22/18 [phone]

## 2018-07-22 NOTE — Progress Notes (Signed)
Advance care planning  Purpose of Encounter Left femur fracture and code status discussion  Parties in Attendance Patient, husband and sister-in-law  Patients Decisional capacity Patient is alert and oriented.  Able to make medical decisions.  Does not have a documented healthcare power of attorney or advanced directives.  Wants her husband to be the healthcare decision maker if she is unable to.  Discussed regarding her femur fracture and need for surgery.  Waiting for orthopedics.  Prognosis and treatment plan discussed.  All questions answered  CODE STATUS discussed and patient wants to be a full code.  Orders entered.  Status changed.   Time spent - 17 minutes

## 2018-07-22 NOTE — Transfer of Care (Signed)
Immediate Anesthesia Transfer of Care Note  Patient: Denise Webb  Procedure(s) Performed: INTRAMEDULLARY (IM) NAIL INTERTROCHANTRIC (Left )  Patient Location: PACU  Anesthesia Type:Spinal  Level of Consciousness: sedated  Airway & Oxygen Therapy: Patient Spontanous Breathing and Patient connected to nasal cannula oxygen  Post-op Assessment: Report given to RN and Post -op Vital signs reviewed and stable  Post vital signs: Reviewed and stable  Last Vitals:  Vitals Value Taken Time  BP 89/66 07/22/2018  4:47 PM  Temp    Pulse 55 07/22/2018  4:48 PM  Resp 13 07/22/2018  4:48 PM  SpO2 100 % 07/22/2018  4:48 PM  Vitals shown include unvalidated device data.  Last Pain:  Vitals:   07/22/18 1404  TempSrc: Temporal  PainSc:          Complications: No apparent anesthesia complications

## 2018-07-22 NOTE — Progress Notes (Signed)
Patient complaining of pain in her bladder and needing to pee but can't. Patient bladder scanned and showed > 1141 of urine in bladder. Called Dr Roland Rack and got verbal order for foley catheter. Catheter placed and drained 1250 mL of urine, clamped off every 571mL.

## 2018-07-22 NOTE — Anesthesia Preprocedure Evaluation (Signed)
Anesthesia Evaluation  Patient identified by MRN, date of birth, ID band Patient awake    Reviewed: Allergy & Precautions, H&P , NPO status , Patient's Chart, lab work & pertinent test results  History of Anesthesia Complications Negative for: history of anesthetic complications  Airway Mallampati: II  TM Distance: >3 FB Neck ROM: full    Dental  (+) Chipped, Poor Dentition, Missing   Pulmonary sleep apnea , COPD, Current Smoker,           Cardiovascular Exercise Tolerance: Good hypertension, (-) angina(-) Past MI and (-) DOE + dysrhythmias Atrial Fibrillation      Neuro/Psych PSYCHIATRIC DISORDERS Anxiety Depression  Neuromuscular disease    GI/Hepatic Neg liver ROS, hiatal hernia, GERD  Medicated and Controlled,  Endo/Other  diabetes, Type 2  Renal/GU      Musculoskeletal  (+) Arthritis ,   Abdominal   Peds  Hematology negative hematology ROS (+)   Anesthesia Other Findings Past Medical History: 09/19/2013: Abdominal pain, unspecified site 12/2015: Acute blood loss anemia No date: Anxiety No date: Bilateral great toes ulcers (HCC) No date: Colitis No date: Depression No date: Diabetes mellitus without complication (HCC)     Comment:  type 2  06/24/2015: Diabetic peripheral neuropathy associated with type 2  diabetes mellitus ([place]) 01/27/2016: Fracture of right iliac wing (HCC) No date: GERD (gastroesophageal reflux disease) 06/25/2015: History of head injury No date: Hyperlipidemia No date: Hypertension 2011-2014: IBS (irritable bowel syndrome)     Comment:  ER visits only  No date: Internal hemorrhoids 10/09/2014: Lumbar radicular pain (Bilateral) (R>L) (Right L5  Dermatome)     Comment:  On the right leg to pain goes to the top of the foot and              big toe following an L5 dermatomal distribution. On the               left side it does not go all the way into the foot.  No date: Neuropathy  Comment:  severe both feet/lower legs No date: Plantar fasciitis 12/2015: Sternal fracture No date: TMJ (dislocation of temporomandibular joint) No date: Trichomoniasis  Past Surgical History: 1988: CESAREAN SECTION     Comment:  [place] 1990: PELVIC EXENTERATION     Comment:  Uterus Frozen (cancer) No date: TUBAL LIGATION 2004/2005: tubes tied     Comment:  [place]  BMI    Body Mass Index:  22.35 kg/m      Reproductive/Obstetrics negative OB ROS                             Anesthesia Physical Anesthesia Plan  ASA: III  Anesthesia Plan: Spinal   Post-op Pain Management:    Induction:   PONV Risk Score and Plan:   Airway Management Planned: Natural Airway and Nasal Cannula  Additional Equipment:   Intra-op Plan:   Post-operative Plan:   Informed Consent: I have reviewed the patients History and Physical, chart, labs and discussed the procedure including the risks, benefits and alternatives for the proposed anesthesia with the patient or authorized representative who has indicated his/her understanding and acceptance.   Dental Advisory Given  Plan Discussed with: Anesthesiologist, CRNA and Surgeon  Anesthesia Plan Comments: (Patient reports chronic back pain which sometimes radiates down into her legs.  She also reports trouble urinating in the past 2/2 narcotics that improved with enema.  Patient reports no bleeding problems and no anticoagulant  use.  Plan for spinal with backup GA  Patient consented for risks of anesthesia including but not limited to:  - adverse reactions to medications - risk of bleeding, infection, nerve damage and headache - risk of failed spinal - damage to teeth, lips or other oral mucosa - sore throat or hoarseness - Damage to heart, brain, lungs or loss of life  Patient voiced understanding.)        Anesthesia Quick Evaluation

## 2018-07-22 NOTE — Care Management (Signed)
Attempted to assess patient for transition of care needs. She remained asleep.  I have left home health agency list at bedside alone with information to [place] outpatient PT program, community resources such as for transportation and indigent clinics.  Application to [facility] and medication management also.  RNCm will follow.

## 2018-07-22 NOTE — ED Triage Notes (Signed)
Pt here from home s/p fall on right side with report of pain 10/10 to left hip , Denies loc .

## 2018-07-22 NOTE — Anesthesia Post-op Follow-up Note (Signed)
Anesthesia QCDR form completed.        

## 2018-07-22 NOTE — Op Note (Signed)
07/22/2018  4:45 PM  Patient:   Denise Webb  Pre-Op Diagnosis:   Closed minimally displaced 2-part intertrochanteric fracture, left hip.  Post-Op Diagnosis:   Same  Procedure:   Reduction and internal fixation of minimally displaced 2-part intertrochanteric left hip fracture with Biomet Affixis TFN nail.  Surgeon:   Pascal Lux, MD  Assistant:   None  Anesthesia:   Spinal  Findings:   As above  Complications:   None  EBL:   100 cc  Fluids:   800 cc crystalloid  UOP:   150 cc  TT:   None  Drains:   None  Closure:   Staples  Implants:   Biomet Affixis 11 x 380 mm TFN with a 95 mm lag screw and a 40 mm distal interlocking screw  Brief Clinical Note:   The patient is a 52 year old female who sustained the above-noted injury when she slipped and fell in her kitchen last evening. She was brought to the emergency room where x-rays demonstrated the above-noted injury. The patient has been cleared medically and presents at this time for reduction and internal fixation of the minimally displaced 2-part intertrochanteric left hip fracture.  Procedure:   The patient was brought into the operating room. After adequate spinal anesthesia was obtained, the patient was lain in the supine position on the fracture table. The uninjured leg was placed in a flexed and abducted position while the injured lower extremity was placed in longitudinal traction. The fracture was reduced using longitudinal traction and internal rotation. The adequacy of reduction was verified fluoroscopically in AP and lateral projections and found to be near anatomic. The lateral aspects of the left hip and thigh were prepped with ChloraPrep solution before being draped sterilely. Preoperative antibiotics were administered. A timeout was performed to verify the appropriate surgical site. The greater trochanter was identified fluoroscopically and an approximately 3 cm incision made about 2-3 fingerbreadths above  the tip of the greater trochanter. The incision was carried down through the subcutaneous tissues to expose the gluteal fascia. This was split the length of the incision, providing access to the tip of the trochanter. Under fluoroscopic guidance, a guidewire was drilled through the tip of the trochanter into the proximal metaphysis to the level of the lesser trochanter. After verifying its position fluoroscopically in AP and lateral projections, it was overreamed with the initial reamer to the depth of the lesser trochanter. A guidewire was passed down through the femoral canal to the supracondylar region. The adequacy of guidewire position was verified fluoroscopically in AP and lateral projections before the length of the guidewire within the canal was measured and found to be 405 mm. Therefore, a 380 mm length nail was selected. The guidewire was overreamed sequentially using the flexible reamers, beginning with a 10 mm reamer and progressing to a 12.5 mm reamer. This provided good cortical chatter. The 11 x 380 mm Biomet Affixis TFN rod was selected and advanced to the appropriate depth, as verified fluoroscopically.   The guide system for the lag screw was positioned and advanced through an approximately 2 cm stab incision over the lateral aspect of the proximal femur. The guidewire was drilled up through the trochanteric femoral nail and into the femoral neck to rest within 5 mm of subchondral bone. After verifying its position in the femoral neck and head in both AP and lateral projections, the guidewire was measured and found to be optimally replicated by a 95 mm lag screw. The guidewire was overreamed  to the appropriate depth before the hole was tapped with a cannulated tap. The lag screw was inserted and advanced to the appropriate depth as verified fluoroscopically in AP and lateral projections. The locking screw was advanced, then backed off a quarter turn to set the lag screw. Again the adequacy of  hardware position and fracture reduction was verified fluoroscopically in AP and lateral projections and found to be excellent.  Attention was directed distally. Using the "perfect circle" technique, the leg and fluoroscopy machine were positioned appropriately. An approximately 1.5 cm stab incision was made over the skin at the appropriate point before the drill bit was advanced through the cortex and across the static hole of the nail. The appropriate length of the screw was determined before the 40 mm distal interlocking screw was positioned, then advanced and tightened securely. Again the adequacy of screw position was verified fluoroscopically in AP and lateral projections and found to be excellent.  The wounds were irrigated thoroughly with sterile saline solution before the abductor fascia was reapproximated using #1 Vicryl interrupted sutures. The subcutaneous tissues were closed using 2-0 Vicryl interrupted sutures. The skin was closed using staples. A total of 30 cc of 0.5% Sensorcaine with epinephrine was injected in and around all incisions. Sterile occlusive dressings were applied to all wounds before the patient was transferred back to her hospital bed. The patient was then transferred to the recovery room in satisfactory condition after tolerating the procedure well.

## 2018-07-22 NOTE — Consult Note (Signed)
ORTHOPAEDIC CONSULTATION  REQUESTING PHYSICIAN: Hillary Bow, MD  Chief Complaint:   Left hip pain.  History of Present Illness: Denise Webb is a 52 y.o. female with multiple medical problems including diabetes, anxiety/depression, hyperlipidemia, hypertension, irritable bowel syndrome who was in her usual state of health last evening when she apparently slipped and fell while in her kitchen, landing on her left hip.  She was brought to the emergency room where x-rays demonstrated an intertrochanteric fracture of her left hip.  The patient subsequently has been admitted for definitive management of this injury.  The patient denies any associated injury resulting from the fall.  She did not strike her head or lose consciousness.  In addition, the patient denies any lightheadedness, dizziness, chest pain, shortness of breath, or other symptoms which may have precipitated her fall.  Past Medical History:  Diagnosis Date  . Abdominal pain, unspecified site 09/19/2013  . Acute blood loss anemia 12/2015  . Anxiety   . Bilateral great toes ulcers ([place])   . Colitis   . Depression   . Diabetes mellitus without complication ([place])    type 2   . Diabetic peripheral neuropathy associated with type 2 diabetes mellitus ([place]) 06/24/2015  . Fracture of right iliac wing ([place]) 01/27/2016  . GERD (gastroesophageal reflux disease)   . History of head injury 06/25/2015  . Hyperlipidemia   . Hypertension   . IBS (irritable bowel syndrome) 2011-2014   ER visits only   . Internal hemorrhoids   . Lumbar radicular pain (Bilateral) (R>L) (Right L5 Dermatome) 10/09/2014   On the right leg to pain goes to the top of the foot and big toe following an L5 dermatomal distribution. On the left side it does not go all the way into the foot.   . Neuropathy    severe both feet/lower legs  . Plantar fasciitis   . Sternal fracture 12/2015  . TMJ  (dislocation of temporomandibular joint)   . Trichomoniasis    Past Surgical History:  Procedure Laterality Date  . CESAREAN SECTION  1988   [facility]  . PELVIC EXENTERATION  1990   Uterus Frozen (cancer)  . TUBAL LIGATION    . tubes tied  2004/2005   [facility]   Social History   Socioeconomic History  . Marital status: Married    Spouse name: Micheal   . Number of children: 1  . Years of education: college  . Highest education level: Not on file  Occupational History  . Occupation: disabled    Fish farm manager: UNEMPLOYED  Social Needs  . Financial resource strain: Not on file  . Food insecurity:    Worry: Not on file    Inability: Not on file  . Transportation needs:    Medical: Not on file    Non-medical: Not on file  Tobacco Use  . Smoking status: Current Every Day Smoker    Packs/day: 1.00    Types: Cigarettes  . Smokeless tobacco: Never Used  Substance and Sexual Activity  . Alcohol use: Yes    Alcohol/week: 1.0 standard drinks    Types: 1 Glasses of wine per week    Comment: on weekend occassionally  "   12/2015 Once or twice a week "  . Drug use: No  . Sexual activity: Not on file  Lifestyle  . Physical activity:    Days per week: Not on file    Minutes per session: Not on file  . Stress: Not on file  Relationships  . Social  connections:    Talks on phone: Not on file    Gets together: Not on file    Attends religious service: Not on file    Active member of club or organization: Not on file    Attends meetings of clubs or organizations: Not on file    Relationship status: Not on file  Other Topics Concern  . Not on file  Social History Narrative   Patient lives at home with her husband Hoover Browns)   Trying to get disability.   Education college    Right handed.   Family History  Problem Relation Age of Onset  . Heart disease Mother   . Heart attack Mother   . Heart attack Father   . Cancer Brother        colon cancer  . Colitis Brother   . Cancer  Brother 11       lung cancer  . Heart disease Brother   . Anxiety disorder Brother   . Heart attack Other    Allergies  Allergen Reactions  . Sulfur   . Erythromycin Stearate     REACTION: causes anxiety, heart to race  . Morphine And Related Nausea And Vomiting   Prior to Admission medications   Medication Sig Start Date End Date Taking? Authorizing Provider  ALPRAZolam Duanne Moron) 1 MG tablet Take one tablet by mouth three times daily as needed for anxiety 03/02/16  Yes Lauree Chandler, NP  aspirin-acetaminophen-caffeine (EXCEDRIN MIGRAINE) [phone] MG per tablet Take by mouth every 6 (six) hours as needed for headache.   Yes [provider]  atenolol (TENORMIN) 25 MG tablet TAKE 1 TABLET BY MOUTH EVERY DAY 03/11/15  Yes Reed, Tiffany L, DO  atorvastatin (LIPITOR) 10 MG tablet TAKE 1 TABLET (10 MG TOTAL) BY MOUTH DAILY. 03/11/15  Yes Gildardo Cranker, DO  Calcium Acetate, Phos Binder, (CALCIUM ACETATE PO) Take by mouth. By mouth every other day.   Yes [provider]  citalopram (CELEXA) 40 MG tablet Take 1 tablet (40 mg total) by mouth daily. 02/27/16  Yes Lauree Chandler, NP  glucose blood test strip 1 each by Other route as needed for other. Reli on prime: check blood sugar twice daily DX: 250.62   Yes [provider]  hydrochlorothiazide (HYDRODIURIL) 25 MG tablet TAKE 1 TABLET (25 MG TOTAL) BY MOUTH DAILY. 03/11/15  Yes Eulas Post, Monica, DO  Lactobacillus (ACIDOPHILUS PROBIOTIC) 100 MG CAPS Take 1 capsule (100 mg total) by mouth daily. 05/28/11  Yes Waldemar Dickens, MD  metFORMIN (GLUCOPHAGE) 500 MG tablet TAKE 1 TABLET (500 MG TOTAL) BY MOUTH 2 (TWO) TIMES DAILY WITH A MEAL. Patient taking differently: 1,000 mg 2 (two) times daily with a meal.  03/11/15  Yes Reed, Tiffany L, DO  mupirocin ointment (BACTROBAN) 2 % Apply 1 application topically 2 (two) times daily. 10/29/15  Yes Trula Slade, DPM  naproxen (NAPROSYN) 500 MG tablet Take 1 tablet (500 mg total)  by mouth 2 (two) times daily with a meal. 01/30/16  Yes Lisette Abu, PA-C  omeprazole (PRILOSEC) 40 MG capsule Take 1 capsule (40 mg total) by mouth daily. 01/15/16  Yes Gildardo Cranker, DO  Oxycodone HCl 20 MG TABS 1 by mouth four times daily 02/24/16  Yes Lauree Chandler, NP  triamcinolone (NASACORT AQ) 55 MCG/ACT AERO nasal inhaler 2 sprays in each nostril daily 01/16/14  Yes Estill Dooms, MD  Vitamin D, Ergocalciferol, (DRISDOL) 1.25 MG (50000 UT) CAPS capsule Take 2 capsules  by mouth once a week. 05/12/18  Yes [provider]  gabapentin (NEURONTIN) 300 MG capsule Take 1 capsule (300 mg total) by mouth every 6 (six) hours as needed. Patient not taking: Reported on 07/22/2018 02/03/16   Lauree Chandler, NP  gabapentin (NEURONTIN) 600 MG tablet Take 1 tablet (600 mg total) by mouth every 6 (six) hours. Patient not taking: Reported on 07/22/2018 02/03/16   Lauree Chandler, NP  loperamide (IMODIUM A-D) 2 MG tablet Take 2 mg by mouth 3 (three) times daily as needed. Take 2 tabs by mouth once daily as needed for diarrhea. 02/19/12   Waldemar Dickens, MD  neomycin-bacitracin-polymyxin (NEOSPORIN) [phone] ointment Apply daily to sores on feet 01/16/14   Estill Dooms, MD  oxyCODONE-acetaminophen (PERCOCET) 10-325 MG tablet Take 1-2 tablets by mouth every 4 (four) hours as needed for pain. Patient not taking: Reported on 07/22/2018 02/06/16   Lauree Chandler, NP  traMADol (ULTRAM) 50 MG tablet Take 2 tablets (100 mg total) by mouth every 6 (six) hours. Patient not taking: Reported on 07/22/2018 01/30/16   Bryn Gulling   Dg Chest Portable 1 View  Result Date: 07/22/2018 CLINICAL DATA:  52 year old female with a history hip fracture, preop EXAM: PORTABLE CHEST 1 VIEW COMPARISON:  01/27/2016 FINDINGS: Cardiomediastinal silhouette within normal limits. No evidence of central vascular congestion. No pneumothorax or pleural effusion. No confluent airspace disease. No  displaced fracture. IMPRESSION: Negative for acute cardiopulmonary disease Electronically Signed   By: Corrie Mckusick D.O.   On: 07/22/2018 08:04   Dg Hip Unilat W Or Wo Pelvis 2-3 Views Left  Result Date: 07/22/2018 CLINICAL DATA:  Fall.  Left hip pain.  Initial encounter. EXAM: DG HIP (WITH OR WITHOUT PELVIS) 2-3V LEFT COMPARISON:  None. FINDINGS: Minimally displaced intertrochanteric fracture is present in the left proximal femur. Visualized pelvis is otherwise unremarkable. Degenerative changes are noted in the lower lumbar spine. IMPRESSION: 1. Minimally displaced intertrochanteric fracture of the proximal left femur. Electronically Signed   By: San Morelle M.D.   On: 07/22/2018 07:22    Positive ROS: All other systems have been reviewed and were otherwise negative with the exception of those mentioned in the HPI and as above.  Physical Exam: General:  Alert, no acute distress Psychiatric:  Patient is competent for consent with normal mood and affect   Cardiovascular:  No pedal edema Respiratory:  No wheezing, non-labored breathing GI:  Abdomen is soft and non-tender Skin:  No lesions in the area of chief complaint Neurologic:  Sensation intact distally Lymphatic:  No axillary or cervical lymphadenopathy  Orthopedic Exam:  Orthopedic examination is limited to the left hip and lower extremity.  The left lower extremity is somewhat shortened and externally rotated as compared to the right lower extremity.  Skin inspection around the left hip is unremarkable.  There is perhaps minimal swelling, but no erythema, ecchymosis, abrasions, or other skin abnormalities are identified.  She has mild tenderness palpation of the lateral aspect of the hip.  She has significant pain with any attempted active or passive motion of the left hip.  She is able to actively dorsiflex and plantarflex her toes and ankle.  Sensation is intact to light touch in all distributions.  She has good capillary  refill to her left foot and lower leg.  X-rays:  X-rays of the pelvis and left hip are available for review and have been reviewed by myself.  These films demonstrate a minimally displaced 2  part intertrochanteric fracture of the left hip.  No significant degenerative changes of the left hip are noted, and no lytic lesions are identified.  No other acute bony abnormalities are noted either.  Assessment: Closed minimally displaced 2 part intertrochanteric fracture, left hip.  Plan: The treatment options have been discussed with the patient, including both surgical and nonsurgical choices.  The patient would like to proceed with surgical intervention to include a reduction and intramedullary nailing of the intertrochanteric fracture of her left hip.  This procedure has been discussed in detail, as have the potential risks (including bleeding, infection, nerve and/or blood vessel injury, persistent recurrent pain, stiffness of the hip, malunion or nonunion, leg length inequality, need for additional surgery, blood clots, strokes, heart attacks and/or arrhythmias, etc.) and benefits.  The patient states her understanding wishes to proceed.  A consent has been signed.  Thank you for asking me to participate in the care of this most pleasant woman.  I will be happy to follow her with you.   Pascal Lux, MD  Beeper #:  [phone]  07/22/2018 2:56 PM
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
## 2018-07-23 ENCOUNTER — Encounter: Payer: Self-pay | Admitting: Surgery

## 2018-07-23 LAB — GLUCOSE, CAPILLARY
GLUCOSE-CAPILLARY: 103 mg/dL — AB (ref 70–99)
Glucose-Capillary: 104 mg/dL — ABNORMAL HIGH (ref 70–99)
Glucose-Capillary: 111 mg/dL — ABNORMAL HIGH (ref 70–99)
Glucose-Capillary: 94 mg/dL (ref 70–99)
Glucose-Capillary: 97 mg/dL (ref 70–99)
Glucose-Capillary: 98 mg/dL (ref 70–99)

## 2018-07-23 LAB — CBC
HCT: 31 % — ABNORMAL LOW (ref 36.0–46.0)
Hemoglobin: 10.2 g/dL — ABNORMAL LOW (ref 12.0–15.0)
MCH: 30.1 pg (ref 26.0–34.0)
MCHC: 32.9 g/dL (ref 30.0–36.0)
MCV: 91.4 fL (ref 80.0–100.0)
Platelets: 255 10*3/uL (ref 150–400)
RBC: 3.39 MIL/uL — ABNORMAL LOW (ref 3.87–5.11)
RDW: 12 % (ref 11.5–15.5)
WBC: 8.4 10*3/uL (ref 4.0–10.5)
nRBC: 0 % (ref 0.0–0.2)

## 2018-07-23 LAB — BASIC METABOLIC PANEL
Anion gap: 4 — ABNORMAL LOW (ref 5–15)
BUN: 13 mg/dL (ref 6–20)
CHLORIDE: 109 mmol/L (ref 98–111)
CO2: 27 mmol/L (ref 22–32)
Calcium: 8.6 mg/dL — ABNORMAL LOW (ref 8.9–10.3)
Creatinine, Ser: 0.49 mg/dL (ref 0.44–1.00)
GFR calc Af Amer: >60 mL/min (ref >60)
GFR calc non Af Amer: >60 mL/min (ref >60)
GLUCOSE: 106 mg/dL — AB (ref 70–99)
Potassium: 3.5 mmol/L (ref 3.5–5.1)
Sodium: 140 mmol/L (ref 135–145)

## 2018-07-23 LAB — HIV ANTIBODY (ROUTINE TESTING W REFLEX): HIV Screen 4th Generation wRfx: NONREACTIVE

## 2018-07-23 NOTE — Progress Notes (Signed)
[facility] [facility] [place] NAME: Denise Webb    MR#:  [id number]  DATE OF BIRTH:  06-15-1966  SUBJECTIVE:  CHIEF COMPLAINT:   Chief Complaint  Patient presents with  . Fall   Pain well controlled Worked with PT  REVIEW OF SYSTEMS:    Review of Systems  Constitutional: Positive for malaise/fatigue. Negative for chills and fever.  HENT: Negative for sore throat.   Eyes: Negative for blurred vision, double vision and pain.  Respiratory: Negative for cough, hemoptysis, shortness of breath and wheezing.   Cardiovascular: Negative for chest pain, palpitations, orthopnea and leg swelling.  Gastrointestinal: Negative for abdominal pain, constipation, diarrhea, heartburn, nausea and vomiting.  Genitourinary: Negative for dysuria and hematuria.  Musculoskeletal: Positive for joint pain. Negative for back pain.  Skin: Negative for rash.  Neurological: Negative for sensory change, speech change, focal weakness and headaches.  Endo/Heme/Allergies: Does not bruise/bleed easily.  Psychiatric/Behavioral: Negative for depression. The patient is not nervous/anxious.     DRUG ALLERGIES:   Allergies  Allergen Reactions  . Sulfur   . Erythromycin Stearate     REACTION: causes anxiety, heart to race  . Morphine And Related Nausea And Vomiting    VITALS:  Blood pressure 95/74, pulse 74, temperature 98.1 F (36.7 C), temperature source Oral, resp. rate 16, height 5\' 8"  (1.727 m), weight 66.7 kg, last menstrual period 12/14/2011, SpO2 94 %.  PHYSICAL EXAMINATION:   Physical Exam  GENERAL:  52 y.o.-year-old patient lying in the bed with no acute distress.  EYES: Pupils equal, round, reactive to light and accommodation. No scleral icterus. Extraocular muscles intact.  HEENT: Head atraumatic, normocephalic. Oropharynx and nasopharynx clear.  NECK:  Supple, no jugular venous distention. No thyroid enlargement, no tenderness.  LUNGS: Normal breath  sounds bilaterally, no wheezing, rales, rhonchi. No use of accessory muscles of respiration.  CARDIOVASCULAR: S1, S2 normal. No murmurs, rubs, or gallops.  ABDOMEN: Soft, nontender, nondistended. Bowel sounds present. No organomegaly or mass.  EXTREMITIES: No cyanosis, clubbing or edema b/l.  Dressing over surgical site  NEUROLOGIC: Cranial nerves II through XII are intact. No focal Motor or sensory deficits b/l.   PSYCHIATRIC: The patient is alert and oriented x 3.  SKIN: No obvious rash, lesion, or ulcer.   LABORATORY PANEL:   CBC Recent Labs  Lab 07/23/18 0331  WBC 8.4  HGB 10.2*  HCT 31.0*  PLT 255   ------------------------------------------------------------------------------------------------------------------ Chemistries  Recent Labs  Lab 07/22/18 0637 07/23/18 0331  NA 140 140  K 3.4* 3.5  CL 101 109  CO2 27 27  GLUCOSE 81 106*  BUN 24* 13  CREATININE 0.73 0.49  CALCIUM 10.3 8.6*  AST 25  --   ALT 13  --   ALKPHOS 97  --   BILITOT 0.5  --    ------------------------------------------------------------------------------------------------------------------  Cardiac Enzymes No results for input(s): TROPONINI in the last 168 hours. ------------------------------------------------------------------------------------------------------------------  RADIOLOGY:  Dg Chest Portable 1 View  Result Date: 07/22/2018 CLINICAL DATA:  52 year old female with a history hip fracture, preop EXAM: PORTABLE CHEST 1 VIEW COMPARISON:  01/27/2016 FINDINGS: Cardiomediastinal silhouette within normal limits. No evidence of central vascular congestion. No pneumothorax or pleural effusion. No confluent airspace disease. No displaced fracture. IMPRESSION: Negative for acute cardiopulmonary disease Electronically Signed   By: Corrie Mckusick D.O.   On: 07/22/2018 08:04   Dg Hip Operative Unilat W Or W/o Pelvis Left  Result Date: 07/22/2018 CLINICAL DATA:  Left hip fracture.  EXAM:  OPERATIVE left HIP (WITH PELVIS IF PERFORMED) 6 VIEWS TECHNIQUE: Fluoroscopic spot image(s) were submitted for interpretation post-operatively. Radiation exposure index: 1.99 mGy. COMPARISON:  Radiographs of same day. FINDINGS: Six intraoperative fluoroscopic images of the left femur demonstrate the patient be status post surgical screw placement in left femoral neck as well as intramedullary rod fixation of the left femur. Good alignment of fracture components is noted. IMPRESSION: Status post surgical internal fixation of left proximal femur fracture. Electronically Signed   By: Marijo Conception, M.D.   On: 07/22/2018 16:30   Dg Hip Unilat W Or Wo Pelvis 2-3 Views Left  Result Date: 07/22/2018 CLINICAL DATA:  Fall.  Left hip pain.  Initial encounter. EXAM: DG HIP (WITH OR WITHOUT PELVIS) 2-3V LEFT COMPARISON:  None. FINDINGS: Minimally displaced intertrochanteric fracture is present in the left proximal femur. Visualized pelvis is otherwise unremarkable. Degenerative changes are noted in the lower lumbar spine. IMPRESSION: 1. Minimally displaced intertrochanteric fracture of the proximal left femur. Electronically Signed   By: San Morelle M.D.   On: 07/22/2018 07:22     ASSESSMENT AND PLAN:   * Minimally displaced intertrochanteric fracture of the proximal left femur. 1 Day Post-Op Procedure INTRAMEDULLARY (IM) NAIL INTERTROCHANTRIC (Left)  Dr. Roland Rack on board Pain meds PO and IV Lovenox  * HTN Hydralazine IV PRN  * DM2 SSI. Q4 accuchecks  * Tobacco abuse Counseled on admission   All the records are reviewed and case discussed with Care Management/Social Worker Management plans discussed with the patient, family and they are in agreement.  CODE STATUS: FULL CODE  DVT Prophylaxis: SCDs  TOTAL TIME TAKING CARE OF THIS PATIENT: 30 minutes.   POSSIBLE D/C IN 1-2 DAYS, DEPENDING ON CLINICAL CONDITION.  Leia Alf Devika Dragovich M.D on 07/23/2018 at 11:32 AM  Between 7am to 6pm -  Pager - [phone]  After 6pm go to [email or web address] - password [email or web address] [facility]  Office  [phone]  CC: Primary care physician; [place], [facility]  Note: This dictation was prepared with Dragon dictation along with smaller phrase technology. Any transcriptional errors that result from this process are unintentional.
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
## 2018-07-23 NOTE — Progress Notes (Signed)
Pt has not voided since catheter was removed early this am. Bladder scan 664 ml. MD Sudini notified, orders received for in and out X1. Order placed.
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
## 2018-07-24 LAB — GLUCOSE, CAPILLARY
Glucose-Capillary: 81 mg/dL (ref 70–99)
Glucose-Capillary: 104 mg/dL — ABNORMAL HIGH (ref 70–99)

## 2018-07-24 MED ORDER — ENOXAPARIN SODIUM 40 MG/0.4ML ~~LOC~~ SOLN: 40.0000 mg | SUBCUTANEOUS | 0 refills | Status: DC

## 2018-07-24 MED ORDER — METHOCARBAMOL 1000 MG/10ML IJ SOLN
500.0000 mg | Freq: Once | INTRAVENOUS | Status: AC
  Administered 2018-07-24: 500 mg via INTRAVENOUS
  Filled 2018-07-24: qty 5

## 2018-07-24 MED ORDER — METHOCARBAMOL 500 MG PO TABS: 500.0000 mg | ORAL_TABLET | Freq: Four times a day (QID) | ORAL | 0 refills | Status: DC | PRN

## 2018-07-24 MED ORDER — DOCUSATE SODIUM 100 MG PO CAPS: 100.0000 mg | ORAL_CAPSULE | Freq: Two times a day (BID) | ORAL | 0 refills | Status: DC

## 2018-07-24 MED ORDER — OXYCODONE HCL 10 MG PO TABS: 10.0000 mg | ORAL_TABLET | Freq: Four times a day (QID) | ORAL | 0 refills | Status: DC | PRN

## 2018-07-24 MED ORDER — METHOCARBAMOL 500 MG PO TABS
500.0000 mg | ORAL_TABLET | Freq: Four times a day (QID) | ORAL | Status: DC | PRN
  Administered 2018-07-24: 500 mg via ORAL
  Filled 2018-07-24: qty 1
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
## 2018-07-24 NOTE — Anesthesia Postprocedure Evaluation (Signed)
Anesthesia Post Note  Patient: Denise Webb  Procedure(s) Performed: INTRAMEDULLARY (IM) NAIL INTERTROCHANTRIC (Left )  Patient location during evaluation: PACU Anesthesia Type: Spinal Level of consciousness: oriented and awake and alert Pain management: pain level controlled Vital Signs Assessment: post-procedure vital signs reviewed and stable Respiratory status: spontaneous breathing, respiratory function stable and patient connected to nasal cannula oxygen Cardiovascular status: blood pressure returned to baseline and stable Postop Assessment: no headache, no backache and no apparent nausea or vomiting Anesthetic complications: no     Last Vitals:  Vitals:   07/23/18 2330 07/24/18 0733  BP: 109/69 114/80  Pulse: 99 92  Resp:  18  Temp:  36.8 C  SpO2: 97% 97%    Last Pain:  Vitals:   07/24/18 0801  TempSrc:   PainSc: [place]

## 2018-07-24 NOTE — Progress Notes (Signed)
Subjective: 2 Days Post-Op Procedure(s) (LRB): INTRAMEDULLARY (IM) NAIL INTERTROCHANTRIC (Left) Patient reports pain as 3 on 0-10 scale.  Patient complaining of intermittent muscle spasms, left thigh that can be severe.   Patient is well, and has had no acute complaints or problems Denies any CP, SOB, ABD pain. We will continue physical therapy today.  Slow progress with PT yesterday. Plan is to go Home after hospital stay.  Objective: Vital signs in last 24 hours: Temp:  [98 F (36.7 C)-100.9 F (38.3 C)] 98.3 F (36.8 C) (12/01 0733) Pulse Rate:  [88-103] 92 (12/01 0733) Resp:  [18] 18 (12/01 0733) BP: (82-114)/(58-80) 114/80 (12/01 0733) SpO2:  [93 %-97 %] 97 % (12/01 0733) Weight:  [78.5 kg] 78.5 kg (12/01 0205)  Intake/Output from previous day: 11/30 0701 - 12/01 0700 In: -  Out: 8127 [Urine:1725] Intake/Output this shift: No intake/output data recorded.  Recent Labs    07/22/18 0637 07/23/18 0331  HGB 12.8 10.2*   Recent Labs    07/22/18 0637 07/23/18 0331  WBC 9.1 8.4  RBC 4.26 3.39*  HCT 39.7 31.0*  PLT 354 255   Recent Labs    07/22/18 0637 07/23/18 0331  NA 140 140  K 3.4* 3.5  CL 101 109  CO2 27 27  BUN 24* 13  CREATININE 0.73 0.49  GLUCOSE 81 106*  CALCIUM 10.3 8.6*   No results for input(s): LABPT, INR in the last 72 hours.  EXAM General - Patient is Alert, Appropriate and Oriented Extremity - Neurovascular intact Sensation intact distally Intact pulses distally Dorsiflexion/Plantar flexion intact No cellulitis present Compartment soft  Thigh soft.  No ecchymosis. Dressing - dressing C/D/I and no drainage Motor Function - intact, moving foot and toes well on exam.   Past Medical History:  Diagnosis Date  . Abdominal pain, unspecified site 09/19/2013  . Acute blood loss anemia 12/2015  . Anxiety   . Bilateral great toes ulcers ([place])   . Colitis   . Depression   . Diabetes mellitus without complication ([place])    type 2   .  Diabetic peripheral neuropathy associated with type 2 diabetes mellitus ([place]) 06/24/2015  . Fracture of right iliac wing ([place]) 01/27/2016  . GERD (gastroesophageal reflux disease)   . History of head injury 06/25/2015  . Hyperlipidemia   . Hypertension   . IBS (irritable bowel syndrome) 2011-2014   ER visits only   . Internal hemorrhoids   . Lumbar radicular pain (Bilateral) (R>L) (Right L5 Dermatome) 10/09/2014   On the right leg to pain goes to the top of the foot and big toe following an L5 dermatomal distribution. On the left side it does not go all the way into the foot.   . Neuropathy    severe both feet/lower legs  . Plantar fasciitis   . Sternal fracture 12/2015  . TMJ (dislocation of temporomandibular joint)   . Trichomoniasis     Assessment/Plan:   2 Days Post-Op Procedure(s) (LRB): INTRAMEDULLARY (IM) NAIL INTERTROCHANTRIC (Left) Active Problems:   Closed intertrochanteric fracture of left femur, initial encounter (HCC)  Estimated body mass index is 26.3 kg/m as calculated from the following:   Height as of this encounter: 5\' 8"  (1.727 m).   Weight as of this encounter: 78.5 kg. Advance diet Up with therapy, weightbearing as tolerated left lower extremity Needs BM Muscle spasms -p.o. Robaxin ordered Labs and vital signs are stable Care management to assist with discharge to home with home health PT.  Remove staples and apply Steri-Strips on 08/05/2018. Follow-up with [facility] orthopedics in 6 weeks for x-rays of the left hip Lovenox 40 mg subcu daily x14 days TED hose bilateral lower extremities x6 weeks, okay to remove at nighttime.   DVT Prophylaxis - Lovenox, TED hose and SCDs Weight-Bearing as tolerated to left leg   T. Rachelle Hora, PA-C [place] 07/24/2018, 8:54 AM

## 2018-07-24 NOTE — Progress Notes (Signed)
DISCHARGE NOTE:  Pt given discharge instructions. Surgical dressing clean and intact. Ted hose on both legs. Pt wheeled to car by staff.

## 2018-07-24 NOTE — Care Management Note (Signed)
Case Management Note  Patient Details  Name: Denise Webb Below MRN: [id number] Date of Birth: 1966/04/13  Subjective/Objective:  Patient to likely discharge with home health. Unfortunately, patient is without payer. Out best option is charity care via [place] care. I have placed referral with Brad from [facility] and we are looking into home health PT from them. Patient has had previous operations and has all needed DME, including rolling walker, bedside commode and shower chair. We will continue to follow for transition of care.                  Action/Plan:   Expected Discharge Date:  07/24/18               Expected Discharge Plan:  [place]  In-House Referral:     Discharge planning Services  CM Consult  Post Acute Care Choice:  Home Health Choice offered to:     DME Arranged:    DME Agency:     HH Arranged:  PT [place]:  [place]  Status of Service:  Completed, signed off  If discussed at [place] of Stay Meetings, dates discussed:    Additional Comments:  Latanya Maudlin, RN 07/24/2018, 9:26 AM

## 2018-07-24 NOTE — Progress Notes (Signed)
PT Cancellation Note  Patient Details Name: Denise Webb MRN: [id number] DOB: [birth year]   Cancelled Treatment:    Reason Eval/Treat Not Completed: Patient declined, no reason specified.  Pt requested that PT come back in an hour after her muscle relaxer has had more time to take effect.  Will re-attempt later when pt is appropriate.   Roxanne Gates, PT, DPT 07/24/2018, 9:30 AM

## 2018-08-01 ENCOUNTER — Telehealth: Payer: Self-pay | Admitting: Licensed Clinical Social Worker

## 2018-08-01 NOTE — Telephone Encounter (Signed)
EMMI flagged patient for answering yes to feeling sad/hopeless/anxious/empty. Clinical Education officer, museum (CSW) attempted to contact patient via telephone however she did not answer and a voicemail was left.   [employer], LCSW [phone]

## 2018-08-02 NOTE — Telephone Encounter (Signed)
Clinical Education officer, museum (CSW) attempted to contact patient for a second time via telephone however she did not answer and a voicemail was left. CSW also attempted to contact patient using a second phone number listed [phone] however it was busy.   [employer], LCSW [phone]
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
## 2018-08-15 NOTE — Telephone Encounter (Signed)
Clinical Social Worker (CSW) received a call back from patient on Saturday 08/13/18. CSW contacted patient on Monday 08/15/18. Patient scored 4 on PHQ-9. Patient reported that she is doing well and her PT is going good. Patient reported that she has chronic depression and anxiety. Patient reported that she is not having thoughts of hurting herself. CSW provided patient with information on outpatient mental health resources including [place] and [employer]. Patient thanked CSW and reported no other needs or concerns.   [employer], LCSW [phone]

## 2022-06-02 DIAGNOSIS — E782 Mixed hyperlipidemia: Secondary | ICD-10-CM | POA: Diagnosis not present

## 2022-06-02 DIAGNOSIS — D518 Other vitamin B12 deficiency anemias: Secondary | ICD-10-CM | POA: Diagnosis not present

## 2022-06-02 DIAGNOSIS — E559 Vitamin D deficiency, unspecified: Secondary | ICD-10-CM | POA: Diagnosis not present

## 2022-07-07 DIAGNOSIS — M170 Bilateral primary osteoarthritis of knee: Secondary | ICD-10-CM | POA: Diagnosis not present

## 2022-07-07 DIAGNOSIS — E119 Type 2 diabetes mellitus without complications: Secondary | ICD-10-CM | POA: Diagnosis not present

## 2022-07-07 DIAGNOSIS — E782 Mixed hyperlipidemia: Secondary | ICD-10-CM | POA: Diagnosis not present

## 2023-09-21 ENCOUNTER — Ambulatory Visit: Payer: Medicaid Other | Admitting: Podiatry

## 2024-04-13 ENCOUNTER — Other Ambulatory Visit: Payer: Self-pay

## 2024-04-13 ENCOUNTER — Encounter: Payer: Self-pay | Admitting: *Deleted

## 2024-04-13 ENCOUNTER — Emergency Department

## 2024-04-13 DIAGNOSIS — E11621 Type 2 diabetes mellitus with foot ulcer: Secondary | ICD-10-CM | POA: Diagnosis present

## 2024-04-13 DIAGNOSIS — F32A Depression, unspecified: Secondary | ICD-10-CM | POA: Diagnosis present

## 2024-04-13 DIAGNOSIS — F419 Anxiety disorder, unspecified: Secondary | ICD-10-CM | POA: Diagnosis present

## 2024-04-13 DIAGNOSIS — Z87828 Personal history of other (healed) physical injury and trauma: Secondary | ICD-10-CM

## 2024-04-13 DIAGNOSIS — Z7984 Long term (current) use of oral hypoglycemic drugs: Secondary | ICD-10-CM

## 2024-04-13 DIAGNOSIS — Z801 Family history of malignant neoplasm of trachea, bronchus and lung: Secondary | ICD-10-CM

## 2024-04-13 DIAGNOSIS — F17210 Nicotine dependence, cigarettes, uncomplicated: Secondary | ICD-10-CM | POA: Diagnosis present

## 2024-04-13 DIAGNOSIS — E11628 Type 2 diabetes mellitus with other skin complications: Secondary | ICD-10-CM | POA: Diagnosis present

## 2024-04-13 DIAGNOSIS — Z888 Allergy status to other drugs, medicaments and biological substances status: Secondary | ICD-10-CM

## 2024-04-13 DIAGNOSIS — Z8249 Family history of ischemic heart disease and other diseases of the circulatory system: Secondary | ICD-10-CM

## 2024-04-13 DIAGNOSIS — L03116 Cellulitis of left lower limb: Secondary | ICD-10-CM | POA: Diagnosis present

## 2024-04-13 DIAGNOSIS — Z800 Family history of malignant neoplasm of digestive organs: Secondary | ICD-10-CM

## 2024-04-13 DIAGNOSIS — E876 Hypokalemia: Secondary | ICD-10-CM | POA: Diagnosis present

## 2024-04-13 DIAGNOSIS — I10 Essential (primary) hypertension: Secondary | ICD-10-CM | POA: Diagnosis present

## 2024-04-13 DIAGNOSIS — L97529 Non-pressure chronic ulcer of other part of left foot with unspecified severity: Secondary | ICD-10-CM | POA: Diagnosis present

## 2024-04-13 DIAGNOSIS — Z7982 Long term (current) use of aspirin: Secondary | ICD-10-CM

## 2024-04-13 DIAGNOSIS — Z885 Allergy status to narcotic agent status: Secondary | ICD-10-CM

## 2024-04-13 DIAGNOSIS — E1169 Type 2 diabetes mellitus with other specified complication: Principal | ICD-10-CM | POA: Diagnosis present

## 2024-04-13 DIAGNOSIS — Z79899 Other long term (current) drug therapy: Secondary | ICD-10-CM

## 2024-04-13 DIAGNOSIS — E1142 Type 2 diabetes mellitus with diabetic polyneuropathy: Secondary | ICD-10-CM | POA: Diagnosis present

## 2024-04-13 DIAGNOSIS — E785 Hyperlipidemia, unspecified: Secondary | ICD-10-CM | POA: Diagnosis present

## 2024-04-13 DIAGNOSIS — G894 Chronic pain syndrome: Secondary | ICD-10-CM | POA: Diagnosis present

## 2024-04-13 DIAGNOSIS — Z818 Family history of other mental and behavioral disorders: Secondary | ICD-10-CM

## 2024-04-13 DIAGNOSIS — Z794 Long term (current) use of insulin: Secondary | ICD-10-CM

## 2024-04-13 DIAGNOSIS — M869 Osteomyelitis, unspecified: Secondary | ICD-10-CM | POA: Diagnosis present

## 2024-04-13 LAB — CBC
HCT: 34.6 % — ABNORMAL LOW (ref 36.0–46.0)
Hemoglobin: 10.9 g/dL — ABNORMAL LOW (ref 12.0–15.0)
MCH: 27.4 pg (ref 26.0–34.0)
MCHC: 31.5 g/dL (ref 30.0–36.0)
MCV: 86.9 fL (ref 80.0–100.0)
Platelets: 394 K/uL (ref 150–400)
RBC: 3.98 MIL/uL (ref 3.87–5.11)
RDW: 14.8 % (ref 11.5–15.5)
WBC: 10.5 K/uL (ref 4.0–10.5)
nRBC: 0 % (ref 0.0–0.2)

## 2024-04-13 LAB — BASIC METABOLIC PANEL WITH GFR
Anion gap: 17 — ABNORMAL HIGH (ref 5–15)
BUN: 12 mg/dL (ref 6–20)
CO2: 24 mmol/L (ref 22–32)
Calcium: 9.6 mg/dL (ref 8.9–10.3)
Chloride: 92 mmol/L — ABNORMAL LOW (ref 98–111)
Creatinine, Ser: 1 mg/dL (ref 0.44–1.00)
GFR, Estimated: >60 mL/min (ref >60)
Glucose, Bld: 183 mg/dL — ABNORMAL HIGH (ref 70–99)
Potassium: 2.7 mmol/L — CL (ref 3.5–5.1)
Sodium: 133 mmol/L — ABNORMAL LOW (ref 135–145)

## 2024-04-13 NOTE — ED Triage Notes (Addendum)
 Pt to triage via wheelchair.  Pt has 2 ulcers on left foot and redness,swelling to lower leg. Drainage noted.  Pt also has ulcer to left middle toe.  No known injury   pt is diabetic.  Pt alert.

## 2024-04-14 ENCOUNTER — Emergency Department

## 2024-04-14 ENCOUNTER — Inpatient Hospital Stay

## 2024-04-14 ENCOUNTER — Inpatient Hospital Stay
Admission: EM | Admit: 2024-04-14 | Discharge: 2024-04-17 | DRG: 617 | Disposition: A | Discharge status: Home / Self Care | Attending: Osteopathic Medicine | Admitting: Osteopathic Medicine

## 2024-04-14 DIAGNOSIS — I10 Essential (primary) hypertension: Secondary | ICD-10-CM | POA: Diagnosis present

## 2024-04-14 DIAGNOSIS — L97529 Non-pressure chronic ulcer of other part of left foot with unspecified severity: Secondary | ICD-10-CM | POA: Diagnosis present

## 2024-04-14 DIAGNOSIS — E876 Hypokalemia: Secondary | ICD-10-CM

## 2024-04-14 DIAGNOSIS — F32A Depression, unspecified: Secondary | ICD-10-CM | POA: Diagnosis present

## 2024-04-14 DIAGNOSIS — E1169 Type 2 diabetes mellitus with other specified complication: Secondary | ICD-10-CM | POA: Diagnosis present

## 2024-04-14 DIAGNOSIS — M869 Osteomyelitis, unspecified: Secondary | ICD-10-CM | POA: Diagnosis present

## 2024-04-14 DIAGNOSIS — F17210 Nicotine dependence, cigarettes, uncomplicated: Secondary | ICD-10-CM | POA: Diagnosis present

## 2024-04-14 DIAGNOSIS — L089 Local infection of the skin and subcutaneous tissue, unspecified: Principal | ICD-10-CM

## 2024-04-14 DIAGNOSIS — Z794 Long term (current) use of insulin: Secondary | ICD-10-CM | POA: Diagnosis not present

## 2024-04-14 DIAGNOSIS — Z801 Family history of malignant neoplasm of trachea, bronchus and lung: Secondary | ICD-10-CM | POA: Diagnosis not present

## 2024-04-14 DIAGNOSIS — E11628 Type 2 diabetes mellitus with other skin complications: Secondary | ICD-10-CM | POA: Diagnosis present

## 2024-04-14 DIAGNOSIS — Z7982 Long term (current) use of aspirin: Secondary | ICD-10-CM | POA: Diagnosis not present

## 2024-04-14 DIAGNOSIS — E118 Type 2 diabetes mellitus with unspecified complications: Secondary | ICD-10-CM | POA: Diagnosis not present

## 2024-04-14 DIAGNOSIS — E1142 Type 2 diabetes mellitus with diabetic polyneuropathy: Secondary | ICD-10-CM | POA: Diagnosis present

## 2024-04-14 DIAGNOSIS — Z79899 Other long term (current) drug therapy: Secondary | ICD-10-CM | POA: Diagnosis not present

## 2024-04-14 DIAGNOSIS — L03119 Cellulitis of unspecified part of limb: Secondary | ICD-10-CM | POA: Diagnosis not present

## 2024-04-14 DIAGNOSIS — L97522 Non-pressure chronic ulcer of other part of left foot with fat layer exposed: Secondary | ICD-10-CM | POA: Diagnosis not present

## 2024-04-14 DIAGNOSIS — G894 Chronic pain syndrome: Secondary | ICD-10-CM | POA: Diagnosis present

## 2024-04-14 DIAGNOSIS — Z7984 Long term (current) use of oral hypoglycemic drugs: Secondary | ICD-10-CM | POA: Diagnosis not present

## 2024-04-14 DIAGNOSIS — L03116 Cellulitis of left lower limb: Secondary | ICD-10-CM | POA: Diagnosis present

## 2024-04-14 DIAGNOSIS — F419 Anxiety disorder, unspecified: Secondary | ICD-10-CM | POA: Diagnosis present

## 2024-04-14 DIAGNOSIS — Z885 Allergy status to narcotic agent status: Secondary | ICD-10-CM | POA: Diagnosis not present

## 2024-04-14 DIAGNOSIS — Z8249 Family history of ischemic heart disease and other diseases of the circulatory system: Secondary | ICD-10-CM | POA: Diagnosis not present

## 2024-04-14 DIAGNOSIS — Z888 Allergy status to other drugs, medicaments and biological substances status: Secondary | ICD-10-CM | POA: Diagnosis not present

## 2024-04-14 DIAGNOSIS — L97512 Non-pressure chronic ulcer of other part of right foot with fat layer exposed: Secondary | ICD-10-CM | POA: Diagnosis not present

## 2024-04-14 DIAGNOSIS — E11621 Type 2 diabetes mellitus with foot ulcer: Secondary | ICD-10-CM | POA: Diagnosis present

## 2024-04-14 DIAGNOSIS — E785 Hyperlipidemia, unspecified: Secondary | ICD-10-CM | POA: Diagnosis present

## 2024-04-14 DIAGNOSIS — Z800 Family history of malignant neoplasm of digestive organs: Secondary | ICD-10-CM | POA: Diagnosis not present

## 2024-04-14 DIAGNOSIS — Z818 Family history of other mental and behavioral disorders: Secondary | ICD-10-CM | POA: Diagnosis not present

## 2024-04-14 LAB — LACTIC ACID, PLASMA
Lactic Acid, Venous: 2.5 mmol/L (ref 0.5–1.9)
Lactic Acid, Venous: 2.4 mmol/L (ref 0.5–1.9)

## 2024-04-14 LAB — GLUCOSE, CAPILLARY
Glucose-Capillary: 97 mg/dL (ref 70–99)
Glucose-Capillary: 137 mg/dL — ABNORMAL HIGH (ref 70–99)

## 2024-04-14 LAB — SEDIMENTATION RATE: Sed Rate: 98 mm/h — ABNORMAL HIGH (ref 0–30)

## 2024-04-14 LAB — CBG MONITORING, ED
Glucose-Capillary: 83 mg/dL (ref 70–99)
Glucose-Capillary: 87 mg/dL (ref 70–99)

## 2024-04-14 LAB — HEMOGLOBIN A1C
Hgb A1c MFr Bld: 6.6 % — ABNORMAL HIGH (ref 4.8–5.6)
Mean Plasma Glucose: 142.72 mg/dL

## 2024-04-14 LAB — MAGNESIUM: Magnesium: 1.7 mg/dL (ref 1.7–2.4)

## 2024-04-14 MED ORDER — HYDROMORPHONE HCL 1 MG/ML IJ SOLN
0.5000 mg | INTRAMUSCULAR | Status: DC | PRN
  Administered 2024-04-16 – 2024-04-17 (×2): 0.5 mg via INTRAVENOUS
  Filled 2024-04-14 (×2): qty 0.5

## 2024-04-14 MED ORDER — BUPROPION HCL 100 MG PO TABS
100.0000 mg | ORAL_TABLET | Freq: Two times a day (BID) | ORAL | Status: DC
  Administered 2024-04-14 – 2024-04-17 (×5): 100 mg via ORAL
  Filled 2024-04-14 (×6): qty 1

## 2024-04-14 MED ORDER — SODIUM CHLORIDE 0.9% FLUSH
3.0000 mL | Freq: Two times a day (BID) | INTRAVENOUS | Status: DC
  Administered 2024-04-14 – 2024-04-17 (×5): 3 mL via INTRAVENOUS

## 2024-04-14 MED ORDER — SODIUM CHLORIDE 0.9 % IV SOLN: INTRAVENOUS | Status: DC

## 2024-04-14 MED ORDER — OXYCODONE HCL 5 MG PO TABS
20.0000 mg | ORAL_TABLET | Freq: Four times a day (QID) | ORAL | Status: DC
  Administered 2024-04-14 – 2024-04-15 (×3): 20 mg via ORAL
  Filled 2024-04-14 (×3): qty 4

## 2024-04-14 MED ORDER — CITALOPRAM HYDROBROMIDE 20 MG PO TABS: 40.0000 mg | ORAL_TABLET | Freq: Every day | ORAL | Status: DC

## 2024-04-14 MED ORDER — ATENOLOL 25 MG PO TABS
25.0000 mg | ORAL_TABLET | Freq: Every day | ORAL | Status: DC
  Administered 2024-04-14: 25 mg via ORAL
  Filled 2024-04-14: qty 1

## 2024-04-14 MED ORDER — CALCIUM ACETATE (PHOS BINDER) 667 MG PO CAPS
1334.0000 mg | ORAL_CAPSULE | Freq: Three times a day (TID) | ORAL | Status: DC
  Administered 2024-04-14 – 2024-04-17 (×8): 1334 mg via ORAL
  Filled 2024-04-14 (×8): qty 2

## 2024-04-14 MED ORDER — MAGNESIUM SULFATE 2 GM/50ML IV SOLN
2.0000 g | Freq: Once | INTRAVENOUS | Status: AC
  Administered 2024-04-14: 2 g via INTRAVENOUS
  Filled 2024-04-14: qty 50

## 2024-04-14 MED ORDER — KETOROLAC TROMETHAMINE 30 MG/ML IJ SOLN
15.0000 mg | Freq: Once | INTRAMUSCULAR | Status: AC
  Administered 2024-04-14: 15 mg via INTRAVENOUS
  Filled 2024-04-14: qty 1

## 2024-04-14 MED ORDER — POTASSIUM CHLORIDE 10 MEQ/100ML IV SOLN
10.0000 meq | INTRAVENOUS | Status: AC
  Administered 2024-04-14 (×5): 10 meq via INTRAVENOUS
  Filled 2024-04-14 (×4): qty 100

## 2024-04-14 MED ORDER — POTASSIUM CHLORIDE CRYS ER 20 MEQ PO TBCR
20.0000 meq | EXTENDED_RELEASE_TABLET | ORAL | Status: AC
  Administered 2024-04-14 (×3): 20 meq via ORAL
  Filled 2024-04-14 (×2): qty 1

## 2024-04-14 MED ORDER — SODIUM CHLORIDE 0.9 % IV BOLUS (SEPSIS)
1000.0000 mL | Freq: Once | INTRAVENOUS | Status: AC
  Administered 2024-04-14: 1000 mL via INTRAVENOUS

## 2024-04-14 MED ORDER — INSULIN ASPART 100 UNIT/ML IJ SOLN: 0.0000 [IU] | Freq: Three times a day (TID) | INTRAMUSCULAR | Status: DC

## 2024-04-14 MED ORDER — RISAQUAD PO CAPS
1.0000 | ORAL_CAPSULE | Freq: Every day | ORAL | Status: DC
  Administered 2024-04-14 – 2024-04-17 (×3): 1 via ORAL
  Filled 2024-04-14 (×4): qty 1

## 2024-04-14 MED ORDER — ATORVASTATIN CALCIUM 20 MG PO TABS
40.0000 mg | ORAL_TABLET | Freq: Every day | ORAL | Status: DC
  Administered 2024-04-14 – 2024-04-17 (×3): 40 mg via ORAL
  Filled 2024-04-14 (×4): qty 2

## 2024-04-14 MED ORDER — DOCUSATE SODIUM 100 MG PO CAPS
100.0000 mg | ORAL_CAPSULE | Freq: Two times a day (BID) | ORAL | Status: DC
  Administered 2024-04-14 – 2024-04-17 (×5): 100 mg via ORAL
  Filled 2024-04-14 (×6): qty 1

## 2024-04-14 MED ORDER — POTASSIUM CHLORIDE CRYS ER 20 MEQ PO TBCR
40.0000 meq | EXTENDED_RELEASE_TABLET | Freq: Once | ORAL | Status: AC
  Administered 2024-04-14: 40 meq via ORAL
  Filled 2024-04-14: qty 2

## 2024-04-14 MED ORDER — CYCLOBENZAPRINE HCL 10 MG PO TABS
10.0000 mg | ORAL_TABLET | Freq: Three times a day (TID) | ORAL | Status: DC | PRN
  Administered 2024-04-14 – 2024-04-17 (×3): 10 mg via ORAL
  Filled 2024-04-14 (×3): qty 1

## 2024-04-14 MED ORDER — VANCOMYCIN HCL 750 MG/150ML IV SOLN
750.0000 mg | Freq: Two times a day (BID) | INTRAVENOUS | Status: DC
  Administered 2024-04-14 – 2024-04-17 (×6): 750 mg via INTRAVENOUS
  Filled 2024-04-14 (×7): qty 150

## 2024-04-14 MED ORDER — ALPRAZOLAM 0.5 MG PO TABS
0.5000 mg | ORAL_TABLET | Freq: Two times a day (BID) | ORAL | Status: DC | PRN
  Administered 2024-04-14 – 2024-04-15 (×2): 0.5 mg via ORAL
  Filled 2024-04-14 (×2): qty 1

## 2024-04-14 MED ORDER — POTASSIUM CHLORIDE 10 MEQ/100ML IV SOLN
10.0000 meq | Freq: Once | INTRAVENOUS | Status: AC
  Administered 2024-04-14: 10 meq via INTRAVENOUS
  Filled 2024-04-14: qty 100

## 2024-04-14 MED ORDER — ALBUTEROL SULFATE (2.5 MG/3ML) 0.083% IN NEBU
2.5000 mg | INHALATION_SOLUTION | RESPIRATORY_TRACT | Status: DC | PRN
  Filled 2024-04-14: qty 3

## 2024-04-14 MED ORDER — SODIUM CHLORIDE 0.9 % IV SOLN
3.0000 g | Freq: Once | INTRAVENOUS | Status: AC
  Administered 2024-04-14: 3 g via INTRAVENOUS
  Filled 2024-04-14: qty 8

## 2024-04-14 MED ORDER — VANCOMYCIN HCL IN DEXTROSE 1-5 GM/200ML-% IV SOLN: 1000.0000 mg | INTRAVENOUS | Status: DC

## 2024-04-14 MED ORDER — TRAZODONE HCL 100 MG PO TABS
100.0000 mg | ORAL_TABLET | Freq: Every day | ORAL | Status: DC
  Administered 2024-04-14 – 2024-04-16 (×3): 100 mg via ORAL
  Filled 2024-04-14 (×3): qty 1

## 2024-04-14 MED ORDER — ENOXAPARIN SODIUM 40 MG/0.4ML IJ SOSY
40.0000 mg | PREFILLED_SYRINGE | INTRAMUSCULAR | Status: DC
  Administered 2024-04-14 – 2024-04-17 (×2): 40 mg via SUBCUTANEOUS
  Filled 2024-04-14 (×3): qty 0.4

## 2024-04-14 MED ORDER — ASPIRIN 81 MG PO TBEC
81.0000 mg | DELAYED_RELEASE_TABLET | Freq: Every day | ORAL | Status: DC
  Administered 2024-04-14: 81 mg via ORAL
  Filled 2024-04-14: qty 1

## 2024-04-14 MED ORDER — VANCOMYCIN HCL IN DEXTROSE 1-5 GM/200ML-% IV SOLN: 1000.0000 mg | Freq: Once | INTRAVENOUS | Status: DC

## 2024-04-14 MED ORDER — PANTOPRAZOLE SODIUM 40 MG PO TBEC
40.0000 mg | DELAYED_RELEASE_TABLET | Freq: Every day | ORAL | Status: DC
  Administered 2024-04-14 – 2024-04-17 (×3): 40 mg via ORAL
  Filled 2024-04-14 (×4): qty 1

## 2024-04-14 MED ORDER — VANCOMYCIN HCL 1750 MG/350ML IV SOLN
1750.0000 mg | Freq: Once | INTRAVENOUS | Status: AC
  Administered 2024-04-14: 1750 mg via INTRAVENOUS
  Filled 2024-04-14: qty 350

## 2024-04-14 MED ORDER — SODIUM CHLORIDE 0.9 % IV SOLN
3.0000 g | Freq: Four times a day (QID) | INTRAVENOUS | Status: DC
  Administered 2024-04-14 – 2024-04-17 (×13): 3 g via INTRAVENOUS
  Filled 2024-04-14 (×14): qty 8

## 2024-04-14 MED ORDER — ASPIRIN-ACETAMINOPHEN-CAFFEINE 250-250-65 MG PO TABS: 1.0000 | ORAL_TABLET | Freq: Four times a day (QID) | ORAL | Status: DC | PRN

## 2024-04-14 MED ORDER — OXYCODONE HCL 5 MG PO TABS
5.0000 mg | ORAL_TABLET | ORAL | Status: DC | PRN
  Administered 2024-04-14: 5 mg via ORAL
  Filled 2024-04-14 (×2): qty 1

## 2024-04-14 NOTE — Plan of Care (Signed)

## 2024-04-14 NOTE — Hospital Course (Addendum)
 Hospital course / significant events:   HPI: Denise Webb is a 58 y.o. female with medical history significant of insulin -dependent diabetes mellitus diabetic peripheral neuropathy complicated by chronic diabetic ulcers, hypertension, hyperlipidemia, chronic pain disorder depression migraine headaches and irritable bowel syndrome.  Patient presented to the emergency room 08/21 evening accompanied by husband.  Chief complaints of left lower extremity swelling and erythema and pain.  She has hide nonhealing ulcers at the base of the foot.  Recently noted blistering of toes and noticed acute swelling of lower extremity with surrounding erythema  08/21: to ED. Started abx. DVT r/o. MRI pending.  08/22: admitted to hospitalist AM. MRI (+)osteo. Podiatry to see.  08/23: underwent partial third ray amputation in combination with bone biopsy of the fourth metatarsal planned for tomorrow      Consultants:  Podiatry  Procedures/Surgeries: 04/16/24 Partial third ray amputation left, Bone biopsy fourth metatarsal left      ASSESSMENT & PLAN:   #Diabetic foot ulcers  #surrounding left lower extremity cellulitis #Osteomyelitis  S/p Partial third ray amputation left and Bone biopsy fourth metatarsal left w/ Dr Janit today  Dressing change tomorrow w/ podiatry and if stable can dc home  WBAT, postop shoe, pain control    #Severe hypokalemia resolved  Potassium replacement protocol   #Chronic pain syndrome:  Confirmed w/ PDMP pt is filling Rx for Oxycodone  20 mg #90 x30 days, filling regularly  oxycodone  20 mg tid here  Dilaudid  for breakthrough    #Insulin -dependent diabetes mellitus: Complicated by diabetic neuropathy.   on Ozempic at home.   Insulin  sliding scale   #Hypertension: Resume home medications as appropriate. Contineu home atenolol  - reduced dose but to avoid rebound tachycardia will not hold altogether unless significantly low BP, consider weaning off altogether      #Anxiety disorder/depression:  Continue wellbutrin     none based on BMI: Body mass index is 23.72 kg/m.SABRA Significantly low or high BMI is associated with higher medical risk.  Underweight - under 18  overweight - 25 to 29 obese - 30 or more Class 1 obesity: BMI of 30.0 to 34 Class 2 obesity: BMI of 35.0 to 39 Class 3 obesity: BMI of 40.0 to 49 Super Morbid Obesity: BMI 50-59 Super-super Morbid Obesity: BMI 60+ Healthy nutrition and physical activity advised as adjunct to other disease management and risk reduction treatments    DVT prophylaxis: lovenox   IV fluids: no continuous IV fluids  Nutrition: carb modified Central lines / other devices: none  Code Status: FULL CODE ACP documentation reviewed: none on file in [facility]  TOC needs: TBD Medical barriers to dispo: wound check in AM and hopefully can go home after that

## 2024-04-14 NOTE — Progress Notes (Signed)
 PROGRESS NOTE    Denise Webb   FMW:[id number] DOB: 1966-01-19  DOA: 04/14/2024 Date of Service: 04/14/24 which is hospital day 0  PCP: [facility]    Hospital course / significant events:   HPI: Denise Webb is a 58 y.o. female with medical history significant of insulin -dependent diabetes mellitus diabetic peripheral neuropathy complicated by chronic diabetic ulcers, hypertension, hyperlipidemia, chronic pain disorder depression migraine headaches and irritable bowel syndrome.  Patient presented to the emergency room 08/21 evening accompanied by husband.  Chief complaints of left lower extremity swelling and erythema and pain.  She has hide nonhealing ulcers at the base of the foot.  Recently noted blistering of toes and noticed acute swelling of lower extremity with surrounding erythema  08/21: to ED. Started abx. DVT r/o. MRI pending.  08/22: admitted to hospitalist AM. MRI (+)osteo      Consultants:  Podiatry  Procedures/Surgeries: none      ASSESSMENT & PLAN:   #Diabetic foot ulcers  #surrounding left lower extremity cellulitis #Osteomyelitis  antibiotics with rocehpin and vancomycin .   Podiatry has been consulted evaluate wound site for debridement.   Cultures ordered prior to initiating IV antibiotics.   Vascular surgery input may be warranted to evaluate viability of the left lower extremity arteries. NPO after midnight    #Severe hypokalemia:  Potassium replacement protocol   #Chronic pain syndrome:  oxycodone  20 mg 3-4 times per day at home, here have scheduled q6h for now Dilaudid  for breakthrough    #Insulin -dependent diabetes mellitus: Complicated by diabetic neuropathy.   on Ozempic at home.   Insulin  sliding scale   #Hypertension: Resume home medications as appropriate. Contineu home meds   #Anxiety disorder/depression:  Continue wellbutrin     none based on BMI: Body mass index is 23.72 kg/m.SABRA Significantly  low or high BMI is associated with higher medical risk.  Underweight - under 18  overweight - 25 to 29 obese - 30 or more Class 1 obesity: BMI of 30.0 to 34 Class 2 obesity: BMI of 35.0 to 39 Class 3 obesity: BMI of 40.0 to 49 Super Morbid Obesity: BMI 50-59 Super-super Morbid Obesity: BMI 60+ Healthy nutrition and physical activity advised as adjunct to other disease management and risk reduction treatments    DVT prophylaxis: lovenox   IV fluids: no continuous IV fluids  Nutrition: carb modified Central lines / other devices: none  Code Status: FULL CODE ACP documentation reviewed: none on file in [facility]  TOC needs: TBD Medical barriers to dispo: osteo needing IV abx likely surgery . Expected medical readiness for discharge pending podiatry eval and blood cultures, possible surgery, likely here a few days.              Subjective / Brief ROS:  Patient reports pain in foot Denies CP/SOB.  Denies new weakness.  Tolerating diet .  Reports no concerns w/ urination/defecation.   Family Communication: none at this time     Objective Findings:  Vitals:   04/14/24 0743 04/14/24 0931 04/14/24 1000 04/14/24 1250  BP:  100/61 110/72 105/71  Pulse:  91 91 91  Resp:    16  Temp:    98.2 F (36.8 C)  TempSrc:    Oral  SpO2: 100%  98% 100%  Weight:      Height:        Intake/Output Summary (Last 24 hours) at 04/14/2024 1646 Last data filed at 04/14/2024 1057 Gross per 24 hour  Intake 1819.67 ml  Output --  Net 1819.67 ml   Filed Weights   04/13/24 2058  Weight: 70.8 kg    Examination:  Physical Exam Constitutional:      General: She is not in acute distress. Neurological:     Mental Status: She is alert.          Scheduled Medications:   acidophilus  1 capsule Oral Daily   aspirin  EC  81 mg Oral Daily   atenolol   25 mg Oral Daily   atorvastatin   40 mg Oral Daily   buPROPion   100 mg Oral BID   calcium  acetate  1,334 mg Oral TID WC   docusate  sodium  100 mg Oral BID   enoxaparin  (LOVENOX ) injection  40 mg Subcutaneous Q24H   insulin  aspart  0-15 Units Subcutaneous TID WC   Oxycodone  HCl  1 tablet Oral Q6H   pantoprazole   40 mg Oral Daily   sodium chloride  flush  3 mL Intravenous Q12H   traZODone   100 mg Oral QHS    Continuous Infusions:  sodium chloride  Stopped (04/14/24 0755)   ampicillin -sulbactam (UNASYN ) IV Stopped (04/14/24 1251)   vancomycin       PRN Medications:  albuterol , ALPRAZolam , aspirin -acetaminophen -caffeine , cyclobenzaprine , HYDROmorphone  (DILAUDID ) injection  Antimicrobials from admission:  Anti-infectives (From admission, onward)    Start     Dose/Rate Route Frequency Ordered Stop   04/14/24 1800  vancomycin  (VANCOREADY) IVPB 750 mg/150 mL       Note to Pharmacy: Pharmacy to dose   750 mg 150 mL/hr over 60 Minutes Intravenous Every 12 hours 04/14/24 0536     04/14/24 1200  Ampicillin -Sulbactam (UNASYN ) 3 g in sodium chloride  0.9 % 100 mL IVPB        3 g 200 mL/hr over 30 Minutes Intravenous Every 6 hours 04/14/24 0525     04/14/24 0530  vancomycin  (VANCOCIN ) IVPB 1000 mg/200 mL premix  Status:  Discontinued       Note to Pharmacy: Pharmacy to dose   1,000 mg 200 mL/hr over 60 Minutes Intravenous Every 24 hours 04/14/24 0525 04/14/24 0536   04/14/24 0445  vancomycin  (VANCOREADY) IVPB 1750 mg/350 mL        1,750 mg 175 mL/hr over 120 Minutes Intravenous  Once 04/14/24 0436 04/14/24 0927   04/14/24 0430  Ampicillin -Sulbactam (UNASYN ) 3 g in sodium chloride  0.9 % 100 mL IVPB       Placed in And Linked Group   3 g 200 mL/hr over 30 Minutes Intravenous  Once 04/14/24 0417 04/14/24 0744   04/14/24 0430  vancomycin  (VANCOCIN ) IVPB 1000 mg/200 mL premix  Status:  Discontinued       Placed in And Linked Group   1,000 mg 200 mL/hr over 60 Minutes Intravenous  Once 04/14/24 0417 04/14/24 0435           Data Reviewed:  I have personally reviewed the following...  CBC: Recent Labs  Lab  04/13/24 2101  WBC 10.5  HGB 10.9*  HCT 34.6*  MCV 86.9  PLT 394   Basic Metabolic Panel: Recent Labs  Lab 04/13/24 2101  NA 133*  K 2.7*  CL 92*  CO2 24  GLUCOSE 183*  BUN 12  CREATININE 1.00  CALCIUM  9.6  MG 1.7   GFR: Estimated Creatinine Clearance: 63.4 mL/min (by C-G formula based on SCr of 1 mg/dL). Liver Function Tests: No results for input(s): AST, ALT, ALKPHOS, BILITOT, PROT, ALBUMIN in the last 168 hours. No results for input(s): LIPASE, AMYLASE in the last 168  hours. No results for input(s): AMMONIA in the last 168 hours. Coagulation Profile: No results for input(s): INR, PROTIME in the last 168 hours. Cardiac Enzymes: No results for input(s): CKTOTAL, CKMB, CKMBINDEX, TROPONINI in the last 168 hours. BNP (last 3 results) No results for input(s): PROBNP in the last 8760 hours. HbA1C: Recent Labs    04/14/24 0648  HGBA1C 6.6*   CBG: Recent Labs  Lab 04/14/24 0748 04/14/24 1220 04/14/24 1612  GLUCAP 83 87 97   Lipid Profile: No results for input(s): CHOL, HDL, LDLCALC, TRIG, CHOLHDL, LDLDIRECT in the last 72 hours. Thyroid Function Tests: No results for input(s): TSH, T4TOTAL, FREET4, T3FREE, THYROIDAB in the last 72 hours. Anemia Panel: No results for input(s): VITAMINB12, FOLATE, FERRITIN, TIBC, IRON, RETICCTPCT in the last 72 hours. Most Recent Urinalysis On File:     Component Value Date/Time   COLORURINE STRAW (A) 01/27/2016 2250   APPEARANCEUR CLEAR 01/27/2016 2250   APPEARANCEUR Clear 02/22/2015 1602   LABSPEC 1.008 01/27/2016 2250   PHURINE 6.5 01/27/2016 2250   GLUCOSEU NEGATIVE 01/27/2016 2250   HGBUR NEGATIVE 01/27/2016 2250   BILIRUBINUR NEGATIVE 01/27/2016 2250   BILIRUBINUR Negative 02/22/2015 1602   KETONESUR NEGATIVE 01/27/2016 2250   PROTEINUR NEGATIVE 01/27/2016 2250   UROBILINOGEN 0.2 02/09/2012 1753   NITRITE NEGATIVE 01/27/2016 2250   LEUKOCYTESUR  NEGATIVE 01/27/2016 2250   LEUKOCYTESUR Negative 02/22/2015 1602   Sepsis Labs: @LABRCNTIP (procalcitonin:4,lacticidven:4) Microbiology: Recent Results (from the past 240 hours)  Blood Culture (routine x 2)     Status: None (Preliminary result)   Collection Time: 04/14/24  2:53 AM   Specimen: BLOOD  Result Value Ref Range Status   Specimen Description BLOOD UNKNOWN  Final   Special Requests   Final    BOTTLES DRAWN AEROBIC AND ANAEROBIC Blood Culture results may not be optimal due to an excessive volume of blood received in culture bottles   Culture   Final    NO GROWTH < 12 HOURS Performed at [facility], [address], [place], [place] [address]    Report Status PENDING  Incomplete  Blood Culture (routine x 2)     Status: None (Preliminary result)   Collection Time: 04/14/24  2:53 AM   Specimen: BLOOD  Result Value Ref Range Status   Specimen Description BLOOD UNKNOWN  Final   Special Requests   Final    BOTTLES DRAWN AEROBIC AND ANAEROBIC Blood Culture results may not be optimal due to an excessive volume of blood received in culture bottles   Culture   Final    NO GROWTH < 12 HOURS Performed at [facility], [address] [place], [place] [address]    Report Status PENDING  Incomplete  Aerobic/Anaerobic Culture w Gram Stain (surgical/deep wound)     Status: None (Preliminary result)   Collection Time: 04/14/24  4:46 AM   Specimen: Wound  Result Value Ref Range Status   Specimen Description   Final    WOUND Performed at [facility], [address], [place], [place] [address]    Special Requests   Final    LEFT FOOT Performed at [facility], [address] [place], [place] [address]    Gram Stain   Final    RARE WBC PRESENT, PREDOMINANTLY PMN RARE GRAM POSITIVE COCCI IN PAIRS Performed at [facility], [address], [place], [place] [address]    Culture PENDING  Incomplete   Report Status PENDING  Incomplete  Radiology Studies last 3 days: MRI Left foot without contrast Result Date: 04/14/2024 CLINICAL DATA:  Open wounds along the digits in ball of the foot. Erythema and swelling EXAM: MRI OF THE LEFT FOOT WITHOUT CONTRAST TECHNIQUE: Multiplanar, multisequence MR imaging of the left foot from the midfoot through the toes was performed. No intravenous contrast was administered. COMPARISON:  Radiographs 04/14/2024 FINDINGS: Bones/Joint/Cartilage Dorsal dislocation of the proximal phalanx third toe with respect to the metatarsal head. Marrow edema in the third metatarsal head as well as the proximal and middle phalanges of the third toe suspicious for osteomyelitis particularly given the trace amount of gas just dorsal to the third metatarsal head on image 19 series 10. Possible draining sinus tract extending from the joint to the plantar surface on image 20 series 10. Mild nonspecific marrow edema in the head of the fourth metatarsal and base of the fourth proximal phalanx for example on image 11 series 9, potentially reactive or secondary to early osteomyelitis. Degenerative arthropathy between the head of the first metatarsal and the medial sesamoid associated low-grade marrow edema in both structures. Chronic appearing medial erosion of the head of the first metatarsal possibly from remote gout. Mild hallux valgus. Erosive arthropathy at the interphalangeal joint of the great toe. Ligaments Lisfranc ligament intact. Muscles and Tendons Moderate regional muscular atrophy. Soft tissues Dorsal subcutaneous edema in the forefoot. Plantar ulceration below the medial sesamoid of the first digit. Suspected draining sinus tract extending from the third MCP joint to the plantar surface. Subcutaneous edema extends into the second through fifth toes and may reflect cellulitis. IMPRESSION: 1. Dorsal dislocation of the proximal phalanx third toe with respect to the metatarsal head. Marrow edema in the third metatarsal head as  well as the proximal and middle phalanges of the third toe suspicious for osteomyelitis particularly given the trace amount of gas just dorsal to the third metatarsal head and suspected draining sinus tract extending from the third MCP joint to the plantar surface. 2. Mild nonspecific marrow edema in the head of the fourth metatarsal and base of the fourth proximal phalanx, potentially reactive or secondary to early osteomyelitis. 3. Plantar ulceration below the medial sesamoid of the first digit. 4. Dorsal subcutaneous edema in the forefoot extending into the second through fifth toes, potentially cellulitis. 5. Degenerative arthropathy between the head of the first metatarsal and the medial sesamoid. 6. Chronic appearing medial erosion of the head of the first metatarsal possibly from remote gout. 7. Erosive arthropathy at the interphalangeal joint of the great toe. Electronically Signed   By: Ryan Salvage M.D.   On: 04/14/2024 08:52   DG Foot Complete Left Result Date: 04/14/2024 CLINICAL DATA:  Diabetic foot wounds.  Evaluate for osteomyelitis. EXAM: LEFT FOOT - COMPLETE 3+ VIEW COMPARISON:  None Available. FINDINGS: No acute bony abnormality. Specifically, no fracture, subluxation, or dislocation. No bone destruction to suggest osteomyelitis. IMPRESSION: No acute bony abnormality. Electronically Signed   By: Franky Crease M.D.   On: 04/14/2024 02:52   US  Venous Img Lower Unilateral Left Result Date: 04/13/2024 CLINICAL DATA:  Left lower extremity swelling. EXAM: LEFT LOWER EXTREMITY VENOUS DOPPLER ULTRASOUND TECHNIQUE: Gray-scale sonography with compression, as well as color and duplex ultrasound, were performed to evaluate the deep venous system(s) from the level of the common femoral vein through the popliteal and proximal calf veins. COMPARISON:  None Available. FINDINGS: VENOUS Normal compressibility of the common femoral, superficial femoral, and popliteal veins, as well as the visualized calf  veins.  Visualized portions of profunda femoral vein and great saphenous vein unremarkable. No filling defects to suggest DVT on grayscale or color Doppler imaging. Doppler waveforms show normal direction of venous flow, normal respiratory plasticity and response to augmentation. Limited views of the contralateral common femoral vein are unremarkable. OTHER Prominent left inguinal lymph node, but this maintains a normal central fatty hilum. Limitations: none IMPRESSION: No evidence of left lower extremity DVT. Electronically Signed   By: Franky Crease M.D.   On: 04/13/2024 23:00       Time spent: 25 min    Myna Freimark, DO [facility] 04/14/2024, 4:46 PM    Dictation software may have been used to generate the above note. Typos may occur and escape review in typed/dictated notes. Please contact Dr Marsa directly for clarity if needed.  Staff may message me via secure chat in Epic  but this may not receive an immediate response,  please page me for urgent matters!  If 7PM-7AM, please contact night coverage [email or web address]

## 2024-04-14 NOTE — H&P (Addendum)
 History and Physical    Patient: Denise Webb FMW:[id number] DOB: 1965/12/20 DOA: 04/14/2024 DOS: the patient was seen and examined on 04/14/2024 PCP: [facility]  Patient coming from: Home  Chief Complaint: Left lower extremity swelling and pain Chief Complaint  Patient presents with   Foot Pain   HPI: Denise Webb is a 58 y.o. female with medical history significant of insulin -dependent diabetes mellitus diabetic peripheral neuropathy complicated by chronic diabetic ulcers, hypertension, hyperlipidemia, chronic pain disorder depression migraine headaches and irritable bowel syndrome.  Patient presented to the emergency room this evening accompanied by husband.  Chief complaints of left lower extremity swelling and erythema and pain.  She has hide nonhealing ulcers at the base of the foot.  Recently noted blistering of toes and noticed acute swelling of lower extremity with surrounding erythema today.  Based on these findings, patient decided to come to the emergency room to be further evaluated.  Patient admits doing frequent wound care at home.  She admits compliance with her medications.  She denies any chest pain, palpitations, nausea, vomiting, fever or chills.  Patient was initiated on IV antibiotics with Unasyn  and vancomycin  in the ED.  Review of Systems: As mentioned in the history of present illness. All other systems reviewed and are negative. Past Medical History:  Diagnosis Date   Abdominal pain, unspecified site 09/19/2013   Acute blood loss anemia 12/2015   Anxiety    Bilateral great toes ulcers (HCC)    Colitis    Depression    Diabetes mellitus without complication (HCC)    type 2    Diabetic peripheral neuropathy associated with type 2 diabetes mellitus (HCC) 06/24/2015   Fracture of right iliac wing (HCC) 01/27/2016   GERD (gastroesophageal reflux disease)    History of head injury 06/25/2015   Hyperlipidemia    Hypertension    IBS  (irritable bowel syndrome) 2011-2014   ER visits only    Internal hemorrhoids    Lumbar radicular pain (Bilateral) (R>L) (Right L5 Dermatome) 10/09/2014   On the right leg to pain goes to the top of the foot and big toe following an L5 dermatomal distribution. On the left side it does not go all the way into the foot.    Neuropathy    severe both feet/lower legs   Plantar fasciitis    Sternal fracture 12/2015   TMJ (dislocation of temporomandibular joint)    Trichomoniasis    Past Surgical History:  Procedure Laterality Date   CESAREAN SECTION  1988   [place]   INTRAMEDULLARY (IM) NAIL INTERTROCHANTERIC Left 07/22/2018   Procedure: INTRAMEDULLARY (IM) NAIL INTERTROCHANTRIC;  Surgeon: Edie Norleen PARAS, MD;  Location: [facility] ORS;  Service: Orthopedics;  Laterality: Left;   PELVIC EXENTERATION  1990   Uterus Frozen (cancer)   TUBAL LIGATION     tubes tied  2004/2005   [place]   Social History:  reports that she has been smoking cigarettes. She has never used smokeless tobacco. She reports that she does not currently use alcohol after a past usage of about 1.0 standard drink of alcohol per week. She reports that she does not use drugs.  Allergies  Allergen Reactions   Elemental Sulfur    Erythromycin Stearate     REACTION: causes anxiety, heart to race   Morphine And Codeine Nausea And Vomiting    Family History  Problem Relation Age of Onset   Heart disease Mother    Heart attack Mother    Heart attack  Father    Cancer Brother        colon cancer   Colitis Brother    Cancer Brother 15       lung cancer   Heart disease Brother    Anxiety disorder Brother    Heart attack Other     Prior to Admission medications   Medication Sig Start Date End Date Taking? Authorizing Provider  ALPRAZolam  (XANAX ) 1 MG tablet Take one tablet by mouth three times daily as needed for anxiety 03/02/16   Eubanks, Jessica K, NP  aspirin -acetaminophen -caffeine  (EXCEDRIN  MIGRAINE) 250-250-65 MG per  tablet Take by mouth every 6 (six) hours as needed for headache.    [provider]  atenolol  (TENORMIN ) 25 MG tablet TAKE 1 TABLET BY MOUTH EVERY DAY 03/11/15   Reed, Tiffany L, DO  atorvastatin  (LIPITOR) 10 MG tablet TAKE 1 TABLET (10 MG TOTAL) BY MOUTH DAILY. 03/11/15   Franchot Hough, DO  Calcium  Acetate, Phos Binder, (CALCIUM  ACETATE PO) Take by mouth. By mouth every other day.    [provider]  citalopram  (CELEXA ) 40 MG tablet Take 1 tablet (40 mg total) by mouth daily. 02/27/16   Caro Harlene POUR, NP  docusate sodium  (COLACE) 100 MG capsule Take 1 capsule (100 mg total) by mouth 2 (two) times daily. 07/24/18   Yisroel Sleight, MD  enoxaparin  (LOVENOX ) 40 MG/0.4ML injection Inject 0.4 mLs (40 mg total) into the skin daily. 07/25/18   Sudini, Sleight, MD  glucose blood test strip 1 each by Other route as needed for other. Reli on prime: check blood sugar twice daily DX: 250.62    [provider]  hydrochlorothiazide  (HYDRODIURIL ) 25 MG tablet TAKE 1 TABLET (25 MG TOTAL) BY MOUTH DAILY. 03/11/15   Franchot Hough, DO  Lactobacillus (ACIDOPHILUS PROBIOTIC) 100 MG CAPS Take 1 capsule (100 mg total) by mouth daily. 05/28/11   Remonia Alm PARAS, MD  loperamide  (IMODIUM  A-D) 2 MG tablet Take 2 mg by mouth 3 (three) times daily as needed. Take 2 tabs by mouth once daily as needed for diarrhea. 02/19/12   Remonia Alm PARAS, MD  metFORMIN  (GLUCOPHAGE ) 500 MG tablet TAKE 1 TABLET (500 MG TOTAL) BY MOUTH 2 (TWO) TIMES DAILY WITH A MEAL. Patient taking differently: 1,000 mg 2 (two) times daily with a meal.  03/11/15   Reed, Tiffany L, DO  methocarbamol  (ROBAXIN ) 500 MG tablet Take 1 tablet (500 mg total) by mouth every 6 (six) hours as needed for muscle spasms. 07/24/18   Yisroel Sleight, MD  mupirocin  ointment (BACTROBAN ) 2 % Apply 1 application topically 2 (two) times daily. 10/29/15   Gershon Donnice SAUNDERS, DPM  naproxen  (NAPROSYN ) 500 MG tablet Take 1 tablet (500 mg total) by mouth 2 (two) times  daily with a meal. 01/30/16   Juliane Ozell PARAS, PA-C  neomycin -bacitracin -polymyxin (NEOSPORIN ) [phone] ointment Apply daily to sores on feet 01/16/14   Landy Rome MATSU, MD  omeprazole  (PRILOSEC) 40 MG capsule Take 1 capsule (40 mg total) by mouth daily. 01/15/16   Franchot Hough, DO  oxyCODONE  10 MG TABS Take 1 tablet (10 mg total) by mouth every 6 (six) hours as needed for severe pain. 07/24/18   Yisroel Sleight, MD  triamcinolone  (NASACORT  AQ) 55 MCG/ACT AERO nasal inhaler 2 sprays in each nostril daily 01/16/14   Landy Rome MATSU, MD  Vitamin D , Ergocalciferol , (DRISDOL ) 1.25 MG (50000 UT) CAPS capsule Take 2 capsules by mouth once a week. 05/12/18   [provider]    Physical  Exam: Vitals:   04/14/24 0200 04/14/24 0230 04/14/24 0300 04/14/24 0400  BP: 111/79 93/68 109/69 91/69  Pulse: 95 93 96 85  Resp: 17 (!) 24 (!) 26 15  Temp:      TempSrc:      SpO2: 100% 100% 95% 98%  Weight:      Height:       General: Patient looks clinically well.  Not in any acute distress.  Looks well-hydrated. HEENT: Head is atraumatic, pupils equal round reactive light accommodation, EOMI. Neck: Supple with no JVD Cardiovascular: Regular S1-S2 no murmur Chest clinically clear without any added sounds Abdomen soft nontender with no organomegaly Extremities significant for left lower extremity swelling and erythema up to the shin.  Tender to touch.  Patient has new ulcers on the 4th and 5th digits.  On the plantar aspect of the foot, there are 2 large ulcers on the pressure areas.  Data Reviewed: Sodium is 133, potassium 2.7, chloride is 92, bicarb is 24, glucose 183, BUN is 12, creatinine 1.0 anion gap 17, lactic acid 2.5, WBC 10.5, hemoglobin 10.9, hematocrit 34.6,  Duplex ultrasound showed no evidence of left lower extremity DVT.  X-rays shows no acute bony abnormality.  No fractures or dislocation were noted.  No foreign bodies.  MRI has been ordered and pending.  ESR has also been ordered and  pending   Assessment and Plan: 58 year old with chronic history of diabetes mellitus complicated diabetic neuropathy and diabetic foot ulcers.  Presents with left lower extremity swelling and erythema.  Findings concerning for cellulitis.  Nidus of infection from foot ulcers.  DVT ruled out.  MRI has been ordered to rule out acute osteomyelitis.  #Diabetic foot ulcers with surrounding left lower extremity cellulitis: Patient will be empirically initiated on IV antibiotics with use and vancomycin .  Podiatry has been consulted evaluate wound site for debridement.  Has also been ordered to rule out bone involvement.  ESR ordered pending.  Cultures ordered prior to initiating IV antibiotics.  Duplex ultrasound of the left lower extremity ruled out DVT.  Vascular surgery input may be warranted to evaluate viability of the left lower extremity arteries.  #Severe hypokalemia: Potassium replacement protocol will be offered.  #Chronic pain syndrome: Patient reports being on oxycodone  20 mg 3-4 times per day.  Unable to verify at this time of the day.  Will defer to pharmacy to help with clarification of dose of oxycodone 's.  #Insulin -dependent diabetes mellitus: Complicated by diabetic neuropathy.  Patient is on Ozempic at home.  Insulin  sliding scale will be initiated.  Fingerstick glucose monitoring per protocol.  #Hypertension: Resume home medications as appropriate.  #Anxiety disorder/depression: Continue with home medication.  #DVT prophylaxis  Advance Care Planning:   Code Status: Prior   Consults:   Family Communication: \  Severity of Illness: The appropriate patient status for this patient is INPATIENT. Inpatient status is judged to be reasonable and necessary in order to provide the required intensity of service to ensure the patient's safety. The patient's presenting symptoms, physical exam findings, and initial radiographic and laboratory data in the context of their chronic  comorbidities is felt to place them at high risk for further clinical deterioration. Furthermore, it is not anticipated that the patient will be medically stable for discharge from the hospital within 2 midnights of admission.   * I certify that at the point of admission it is my clinical judgment that the patient will require inpatient hospital care spanning beyond 2 midnights from the  point of admission due to high intensity of service, high risk for further deterioration and high frequency of surveillance required.*  Author: Maude MARLA Dart, MD 04/14/2024 5:26 AM  For on call review [email or web address].

## 2024-04-14 NOTE — Progress Notes (Signed)
 Pharmacy Antibiotic Note  Denise Webb is a 58 y.o. female admitted on 04/14/2024 with osteomyelitis.  Pharmacy has been consulted for Vancomycin  dosing.  Plan: Pt given Vancomycin  1750 mg once. Vancomycin  750 mg IV Q 12 hrs. Goal AUC 400-550. Expected AUC: 516.3 SCr used: 1  Pharmacy will continue to follow and will adjust abx dosing whenever warranted.  Temp (24hrs), Avg:99.1 F (37.3 C), Min:99 F (37.2 C), Max:99.1 F (37.3 C)   Recent Labs  Lab 04/13/24 2101 04/14/24 0253  WBC 10.5  --   CREATININE 1.00  --   LATICACIDVEN  --  2.5*    Estimated Creatinine Clearance: 63.4 mL/min (by C-G formula based on SCr of 1 mg/dL).    Allergies  Allergen Reactions   Elemental Sulfur    Erythromycin Stearate     REACTION: causes anxiety, heart to race   Morphine And Codeine Nausea And Vomiting    Antimicrobials this admission: 8/22 Unasyn  >>  8/22 Vancomycin  >>   Microbiology results: 8/22 BCx: Pending 8/22 WoundCx: Pending  Thank you for allowing pharmacy to be a part of this patient's care.  Rankin CANDIE Dills, PharmD, [facility] 04/14/2024 5:33 AM

## 2024-04-14 NOTE — Progress Notes (Signed)
 ED Pharmacy Antibiotic Sign Off An antibiotic consult was received from an ED provider for Vancomycin  per pharmacy dosing for wound infection. A chart review was completed to assess appropriateness.   The following one time order(s) were placed:  Vancomycin  1750 mg per pt wt: 70.8 kg  Further antibiotic and/or antibiotic pharmacy consults should be ordered by the admitting provider if indicated.   Thank you for allowing pharmacy to be a part of this patient's care.   Thank you, Rankin CANDIE Dills, PharmD, [facility] 04/14/2024 4:37 AM

## 2024-04-14 NOTE — ED Notes (Signed)
 Fall bundle implemented. Fall socks not applied due to pt having a diabetic ulcer and foot needing to be exposed to air.

## 2024-04-14 NOTE — ED Provider Notes (Signed)
 [facility] Provider Note    Event Date/Time   First MD Initiated Contact with Patient 04/14/24 0140     (approximate)   History   Foot Pain   HPI Denise Webb is a 58 y.o. female with extensive chronic pain and diabetes with prior issues with plantar wounds on the left foot.  She presents with worsening ulcerations, swelling, redness on the left foot and now extending up to the left lower leg over the last 2 days.  She has previously seen a podiatrist in [place] but she came in here tonight because she lives closer to [facility].  She has had no fever.  She sometimes has some pain but mostly her neuropathy keeps her from feeling pain and she only noticed it was getting worse because of the swelling of the redness and then her foot also started to drip liquid.     Physical Exam   Triage Vital Signs: ED Triage Vitals  Encounter Vitals Group     BP 04/13/24 2100 (!) 108/94     Girls Systolic BP Percentile --      Girls Diastolic BP Percentile --      Boys Systolic BP Percentile --      Boys Diastolic BP Percentile --      Pulse Rate 04/13/24 2100 (!) 110     Resp 04/13/24 2100 20     Temp 04/13/24 2100 99.1 F (37.3 C)     Temp Source 04/13/24 2100 Oral     SpO2 04/13/24 2100 100 %     Weight 04/13/24 2058 70.8 kg (156 lb)     Height 04/13/24 2058 1.727 m (5' 8)     Head Circumference --      Peak Flow --      Pain Score 04/13/24 2058 8     Pain Loc --      Pain Education --      Exclude from Growth Chart --     Most recent vital signs: Vitals:   04/14/24 0400 04/14/24 0646  BP: 91/69   Pulse: 85   Resp: 15   Temp:  (!) 97.4 F (36.3 C)  SpO2: 98%     General: Awake, alert, appears chronically ill but not in acute distress. CV:  Regular rate and rhythm. Resp:  Normal effort. Speaking easily and comfortably, no accessory muscle usage nor intercostal retractions.   Abd:  No distention.  MSK:  Extensive swelling and erythema of the  left foot with erythema suggestive of cellulitis tracking up the left lower extremity to about the level of the knee, therefore including nearly 50% of the extremity.  The foot is quite edematous with 2 large plantar wounds that are clearly chronic but also the more lateral one appears acutely worse and is dripping seropurulent material from it.  Her third toe is particularly swollen and deformed and there are bloody wounds to either side of that toe.        ED Results / Procedures / Treatments   Labs (all labs ordered are listed, but only abnormal results are displayed) Labs Reviewed  BASIC METABOLIC PANEL WITH GFR - Abnormal; Notable for the following components:      Result Value   Sodium 133 (*)    Potassium 2.7 (*)    Chloride 92 (*)    Glucose, Bld 183 (*)    Anion gap 17 (*)    All other components within normal limits  CBC - Abnormal; Notable  for the following components:   Hemoglobin 10.9 (*)    HCT 34.6 (*)    All other components within normal limits  LACTIC ACID, PLASMA - Abnormal; Notable for the following components:   Lactic Acid, Venous 2.5 (*)    All other components within normal limits  CULTURE, BLOOD (ROUTINE X 2)  CULTURE, BLOOD (ROUTINE X 2)  WOUND CULTURE  AEROBIC/ANAEROBIC CULTURE W GRAM STAIN (SURGICAL/DEEP WOUND)  MAGNESIUM   LACTIC ACID, PLASMA  SEDIMENTATION RATE  HEMOGLOBIN A1C    RADIOLOGY I independently viewed and interpreted the patient's venous ultrasound and I see no evidence of DVT.  Confirmed by radiologist.  I also independently viewed and interpreted the patient's foot x-rays.  I think that there may be a distal fracture of the third phalanx, but the radiologist did not mention it.  My main concern was for osteomyelitis and there does not seem to be any radiographic evidence of osteomyelitis on the x-rays.  MRI of the left foot is pending at the time of admission.   PROCEDURES:  Critical Care performed: No  .Critical  Care  Performed by: Gordan Huxley, MD Authorized by: Gordan Huxley, MD   Critical care provider statement:    Critical care time (minutes):  30   Critical care time was exclusive of:  Separately billable procedures and treating other patients   Critical care was necessary to treat or prevent imminent or life-threatening deterioration of the following conditions: severe diabetic foot infection/cellulitis with elevated lactic acid.   Critical care was time spent personally by me on the following activities:  Development of treatment plan with patient or surrogate, evaluation of patient's response to treatment, examination of patient, obtaining history from patient or surrogate, ordering and performing treatments and interventions, ordering and review of laboratory studies, ordering and review of radiographic studies, pulse oximetry, re-evaluation of patient's condition and review of old charts     IMPRESSION / MDM / ASSESSMENT AND PLAN / ED COURSE  I reviewed the triage vital signs and the nursing notes.                              Differential diagnosis includes, but is not limited to, sepsis, diabetic foot infection, cellulitis, osteomyelitis, DVT.  Patient's presentation is most consistent with acute presentation with potential threat to life or bodily function.  Labs/studies ordered: Venous ultrasound, left foot x-rays, MRI of the left foot, BMP, CBC, magnesium  level, with lactic acid, blood cultures x 2, wound culture, sed rate  Interventions/Medications given:  Medications  potassium chloride  10 mEq in 100 mL IVPB (10 mEq Intravenous New Bag/Given 04/14/24 0659)  vancomycin  (VANCOREADY) IVPB 1750 mg/350 mL (has no administration in time range)  citalopram  (CELEXA ) tablet 40 mg (has no administration in time range)  docusate sodium  (COLACE) capsule 100 mg (has no administration in time range)  pantoprazole  (PROTONIX ) EC tablet 40 mg (has no administration in time range)   aspirin -acetaminophen -caffeine  (EXCEDRIN  MIGRAINE) per tablet 1 tablet (has no administration in time range)  atenolol  (TENORMIN ) tablet 25 mg (has no administration in time range)  atorvastatin  (LIPITOR) tablet 10 mg (has no administration in time range)  ALPRAZolam  (XANAX ) tablet 0.5 mg (has no administration in time range)  acidophilus (RISAQUAD) capsule 1 capsule (has no administration in time range)  oxyCODONE  (Oxy IR/ROXICODONE ) immediate release tablet 5 mg (has no administration in time range)  Ampicillin -Sulbactam (UNASYN ) 3 g in sodium chloride  0.9 % 100 mL  IVPB (has no administration in time range)  insulin  aspart (novoLOG ) injection 0-15 Units (has no administration in time range)  vancomycin  (VANCOREADY) IVPB 750 mg/150 mL (has no administration in time range)  potassium chloride  SA (KLOR-CON  M) CR tablet 20 mEq (has no administration in time range)  0.9 %  sodium chloride  infusion (has no administration in time range)  potassium chloride  SA (KLOR-CON  M) CR tablet 40 mEq (40 mEq Oral Given 04/14/24 0257)  potassium chloride  10 mEq in 100 mL IVPB (0 mEq Intravenous Stopped 04/14/24 0449)  sodium chloride  0.9 % bolus 1,000 mL (1,000 mLs Intravenous New Bag/Given 04/14/24 0257)  ketorolac  (TORADOL ) 30 MG/ML injection 15 mg (15 mg Intravenous Given 04/14/24 0258)  Ampicillin -Sulbactam (UNASYN ) 3 g in sodium chloride  0.9 % 100 mL IVPB (3 g Intravenous New Bag/Given 04/14/24 0449)  magnesium  sulfate IVPB 2 g 50 mL (2 g Intravenous New Bag/Given 04/14/24 0508)    (Note:  hospital course my include additional interventions and/or labs/studies not listed above.)   Vital signs are stable and within normal limits; initially she was tachycardic but it improved with IV fluids.  Although the patient seems like she should meet sepsis criteria, she actually does not have another SIRS criterion other than the initial tachycardia.  However her lactic acid is 2.5 which is worrisome for severe sepsis.  I  am treating her as if for sepsis with empiric antibiotics of Unasyn  3 g IV and vancomycin  per pharmacy consult as recommended by the ED lower extremity order set.  I also ordered potassium repletion for her hypokalemia.  Renal function is adequate.  Patient agrees with the plan for admission.  There is no need for emergent podiatry consult overnight, but she will need podiatry consult in the morning.  I will also talk with the hospitalist about ordering an MRI so that it could be done in time for the podiatry consult in the morning.     Clinical Course as of 04/14/24 9297  Fri Apr 14, 2024  [id number] I am consulting the hospitalist team for admission.   [CF]  [phone] I have personally obtained a wound culture sample from the more lateral of the 2 plantar wounds on her left foot, the 1 that is actively draining seropurulent material [CF]  [phone] I consulted by phone with the admitting hospitalist, and they will admit the patient - Dr. Marca  [CF]    Clinical Course User Index [CF] Gordan Huxley, MD     FINAL CLINICAL IMPRESSION(S) / ED DIAGNOSES   Final diagnoses:  Diabetic infection of left foot (HCC)  Cellulitis of left lower extremity  Hypokalemia     Rx / DC Orders   ED Discharge Orders     None        Note:  This document was prepared using Dragon voice recognition software and may include unintentional dictation errors.   Gordan Huxley, MD 04/14/24 [phone]

## 2024-04-14 NOTE — ED Notes (Signed)
 Pt called out due to IV slipping out of R AC. No pain at site. This RN reestablished lines and provided a warm blanket and beverage. NAD. Call bell within reach.

## 2024-04-15 DIAGNOSIS — L97512 Non-pressure chronic ulcer of other part of right foot with fat layer exposed: Secondary | ICD-10-CM

## 2024-04-15 DIAGNOSIS — L03116 Cellulitis of left lower limb: Secondary | ICD-10-CM

## 2024-04-15 DIAGNOSIS — L97522 Non-pressure chronic ulcer of other part of left foot with fat layer exposed: Secondary | ICD-10-CM

## 2024-04-15 LAB — GLUCOSE, CAPILLARY
Glucose-Capillary: 92 mg/dL (ref 70–99)
Glucose-Capillary: 81 mg/dL (ref 70–99)
Glucose-Capillary: 103 mg/dL — ABNORMAL HIGH (ref 70–99)
Glucose-Capillary: 108 mg/dL — ABNORMAL HIGH (ref 70–99)

## 2024-04-15 LAB — CBC
HCT: 27.6 % — ABNORMAL LOW (ref 36.0–46.0)
Hemoglobin: 8.8 g/dL — ABNORMAL LOW (ref 12.0–15.0)
MCH: 27.9 pg (ref 26.0–34.0)
MCHC: 31.9 g/dL (ref 30.0–36.0)
MCV: 87.6 fL (ref 80.0–100.0)
Platelets: 291 K/uL (ref 150–400)
RBC: 3.15 MIL/uL — ABNORMAL LOW (ref 3.87–5.11)
RDW: 14.6 % (ref 11.5–15.5)
WBC: 4.8 K/uL (ref 4.0–10.5)
nRBC: 0 % (ref 0.0–0.2)

## 2024-04-15 LAB — BASIC METABOLIC PANEL WITH GFR
Anion gap: 7 (ref 5–15)
BUN: 10 mg/dL (ref 6–20)
CO2: 28 mmol/L (ref 22–32)
Calcium: 8.7 mg/dL — ABNORMAL LOW (ref 8.9–10.3)
Chloride: 105 mmol/L (ref 98–111)
Creatinine, Ser: 0.6 mg/dL (ref 0.44–1.00)
GFR, Estimated: >60 mL/min (ref >60)
Glucose, Bld: 104 mg/dL — ABNORMAL HIGH (ref 70–99)
Potassium: 3.6 mmol/L (ref 3.5–5.1)
Sodium: 138 mmol/L (ref 135–145)

## 2024-04-15 LAB — HIV ANTIBODY (ROUTINE TESTING W REFLEX): HIV Screen 4th Generation wRfx: NONREACTIVE

## 2024-04-15 MED ORDER — ATENOLOL 25 MG PO TABS
12.5000 mg | ORAL_TABLET | Freq: Every day | ORAL | Status: DC
  Administered 2024-04-17: 12.5 mg via ORAL
  Filled 2024-04-15 (×2): qty 1

## 2024-04-15 MED ORDER — OXYCODONE HCL 5 MG PO TABS
20.0000 mg | ORAL_TABLET | Freq: Three times a day (TID) | ORAL | Status: DC
  Administered 2024-04-15 – 2024-04-17 (×7): 20 mg via ORAL
  Filled 2024-04-15 (×7): qty 4

## 2024-04-15 NOTE — H&P (View-Only) (Signed)
 PROGRESS NOTE    Denise Webb   FMW:[id number] DOB: 1965-09-27  DOA: 04/14/2024 Date of Service: 04/15/24 which is hospital day 1  PCP: [facility]    Hospital course / significant events:   HPI: Denise Webb is a 58 y.o. female with medical history significant of insulin -dependent diabetes mellitus diabetic peripheral neuropathy complicated by chronic diabetic ulcers, hypertension, hyperlipidemia, chronic pain disorder depression migraine headaches and irritable bowel syndrome.  Patient presented to the emergency room 08/21 evening accompanied by husband.  Chief complaints of left lower extremity swelling and erythema and pain.  She has hide nonhealing ulcers at the base of the foot.  Recently noted blistering of toes and noticed acute swelling of lower extremity with surrounding erythema  08/21: to ED. Started abx. DVT r/o. MRI pending.  08/22: admitted to hospitalist AM. MRI (+)osteo. Podiatry to see.  08/23: partial third ray amputation in combination with bone biopsy of the fourth metatarsal planned for tomorrow      Consultants:  Podiatry  Procedures/Surgeries: none      ASSESSMENT & PLAN:   #Diabetic foot ulcers  #surrounding left lower extremity cellulitis #Osteomyelitis  antibiotics with unasyn  and vancomycin .   Cultures ordered prior to initiating IV antibiotics.   Per podiatry: NPO after midnight for planned partial third ray amputation in combination with bone biopsy of the fourth metatarsal tomorrow    #Severe hypokalemia resolved  Potassium replacement protocol   #Chronic pain syndrome:  Confirmed w/ PDMP pt is filling Rx for Oxycodone  20 mg #90 x30 days, filling regularly  oxycodone  20 mg tid here  Dilaudid  for breakthrough    #Insulin -dependent diabetes mellitus: Complicated by diabetic neuropathy.   on Ozempic at home.   Insulin  sliding scale   #Hypertension: Resume home medications as appropriate. Contineu home  atenolol  - reduced dose but to avoid rebound tachycardia will not hold altogether unless significantly low BP, consider weaning off altogether     #Anxiety disorder/depression:  Continue wellbutrin     none based on BMI: Body mass index is 23.72 kg/m.SABRA Significantly low or high BMI is associated with higher medical risk.  Underweight - under 18  overweight - 25 to 29 obese - 30 or more Class 1 obesity: BMI of 30.0 to 34 Class 2 obesity: BMI of 35.0 to 39 Class 3 obesity: BMI of 40.0 to 49 Super Morbid Obesity: BMI 50-59 Super-super Morbid Obesity: BMI 60+ Healthy nutrition and physical activity advised as adjunct to other disease management and risk reduction treatments    DVT prophylaxis: lovenox   IV fluids: no continuous IV fluids  Nutrition: carb modified Central lines / other devices: none  Code Status: FULL CODE ACP documentation reviewed: none on file in [facility]  TOC needs: TBD Medical barriers to dispo: osteo needing IV abx and surgery . Expected medical readiness for discharge pending postoperative course but hopefully tomorrow 08/24             Subjective / Brief ROS:  Patient reports pain in foot Denies CP/SOB.  Denies new weakness.  Tolerating diet .  Reports no concerns w/ urination/defecation.   Family Communication: none at this time     Objective Findings:  Vitals:   04/14/24 2029 04/15/24 0353 04/15/24 0738 04/15/24 1208  BP: 94/67 101/74 93/68 108/77  Pulse: 93 94 95   Resp: 18 17 17    Temp: 98.3 F (36.8 C) 98 F (36.7 C) 97.8 F (36.6 C)   TempSrc: Oral Oral Oral   SpO2: 96% 99%  99%   Weight:      Height:        Intake/Output Summary (Last 24 hours) at 04/15/2024 1454 Last data filed at 04/15/2024 9666 Gross per 24 hour  Intake 1629.41 ml  Output --  Net 1629.41 ml   Filed Weights   04/13/24 2058  Weight: 70.8 kg    Examination:  Physical Exam Constitutional:      General: She is not in acute distress. Neurological:      Mental Status: She is alert.          Scheduled Medications:   acidophilus  1 capsule Oral Daily   atenolol   12.5 mg Oral Daily   atorvastatin   40 mg Oral Daily   buPROPion   100 mg Oral BID   calcium  acetate  1,334 mg Oral TID WC   docusate sodium   100 mg Oral BID   enoxaparin  (LOVENOX ) injection  40 mg Subcutaneous Q24H   insulin  aspart  0-15 Units Subcutaneous TID WC   oxyCODONE   20 mg Oral TID   pantoprazole   40 mg Oral Daily   sodium chloride  flush  3 mL Intravenous Q12H   traZODone   100 mg Oral QHS    Continuous Infusions:  ampicillin -sulbactam (UNASYN ) IV 3 g (04/15/24 1301)   vancomycin  750 mg (04/15/24 0626)    PRN Medications:  albuterol , ALPRAZolam , aspirin -acetaminophen -caffeine , cyclobenzaprine , HYDROmorphone  (DILAUDID ) injection  Antimicrobials from admission:  Anti-infectives (From admission, onward)    Start     Dose/Rate Route Frequency Ordered Stop   04/14/24 1800  vancomycin  (VANCOREADY) IVPB 750 mg/150 mL       Note to Pharmacy: Pharmacy to dose   750 mg 150 mL/hr over 60 Minutes Intravenous Every 12 hours 04/14/24 0536     04/14/24 1200  Ampicillin -Sulbactam (UNASYN ) 3 g in sodium chloride  0.9 % 100 mL IVPB        3 g 200 mL/hr over 30 Minutes Intravenous Every 6 hours 04/14/24 0525     04/14/24 0530  vancomycin  (VANCOCIN ) IVPB 1000 mg/200 mL premix  Status:  Discontinued       Note to Pharmacy: Pharmacy to dose   1,000 mg 200 mL/hr over 60 Minutes Intravenous Every 24 hours 04/14/24 0525 04/14/24 0536   04/14/24 0445  vancomycin  (VANCOREADY) IVPB 1750 mg/350 mL        1,750 mg 175 mL/hr over 120 Minutes Intravenous  Once 04/14/24 0436 04/14/24 0927   04/14/24 0430  Ampicillin -Sulbactam (UNASYN ) 3 g in sodium chloride  0.9 % 100 mL IVPB       Placed in And Linked Group   3 g 200 mL/hr over 30 Minutes Intravenous  Once 04/14/24 0417 04/14/24 0744   04/14/24 0430  vancomycin  (VANCOCIN ) IVPB 1000 mg/200 mL premix  Status:  Discontinued        Placed in And Linked Group   1,000 mg 200 mL/hr over 60 Minutes Intravenous  Once 04/14/24 0417 04/14/24 0435           Data Reviewed:  I have personally reviewed the following...  CBC: Recent Labs  Lab 04/13/24 2101 04/15/24 0835  WBC 10.5 4.8  HGB 10.9* 8.8*  HCT 34.6* 27.6*  MCV 86.9 87.6  PLT 394 291   Basic Metabolic Panel: Recent Labs  Lab 04/13/24 2101 04/15/24 0835  NA 133* 138  K 2.7* 3.6  CL 92* 105  CO2 24 28  GLUCOSE 183* 104*  BUN 12 10  CREATININE 1.00 0.60  CALCIUM  9.6 8.7*  MG  1.7  --    GFR: Estimated Creatinine Clearance: 79.2 mL/min (by C-G formula based on SCr of 0.6 mg/dL). Liver Function Tests: No results for input(s): AST, ALT, ALKPHOS, BILITOT, PROT, ALBUMIN in the last 168 hours. No results for input(s): LIPASE, AMYLASE in the last 168 hours. No results for input(s): AMMONIA in the last 168 hours. Coagulation Profile: No results for input(s): INR, PROTIME in the last 168 hours. Cardiac Enzymes: No results for input(s): CKTOTAL, CKMB, CKMBINDEX, TROPONINI in the last 168 hours. BNP (last 3 results) No results for input(s): PROBNP in the last 8760 hours. HbA1C: Recent Labs    04/14/24 0648  HGBA1C 6.6*   CBG: Recent Labs  Lab 04/14/24 1220 04/14/24 1612 04/14/24 2118 04/15/24 0739 04/15/24 1135  GLUCAP 87 97 137* 92 81   Lipid Profile: No results for input(s): CHOL, HDL, LDLCALC, TRIG, CHOLHDL, LDLDIRECT in the last 72 hours. Thyroid Function Tests: No results for input(s): TSH, T4TOTAL, FREET4, T3FREE, THYROIDAB in the last 72 hours. Anemia Panel: No results for input(s): VITAMINB12, FOLATE, FERRITIN, TIBC, IRON, RETICCTPCT in the last 72 hours. Most Recent Urinalysis On File:     Component Value Date/Time   COLORURINE STRAW (A) 01/27/2016 2250   APPEARANCEUR CLEAR 01/27/2016 2250   APPEARANCEUR Clear 02/22/2015 1602   LABSPEC 1.008 01/27/2016  2250   PHURINE 6.5 01/27/2016 2250   GLUCOSEU NEGATIVE 01/27/2016 2250   HGBUR NEGATIVE 01/27/2016 2250   BILIRUBINUR NEGATIVE 01/27/2016 2250   BILIRUBINUR Negative 02/22/2015 1602   KETONESUR NEGATIVE 01/27/2016 2250   PROTEINUR NEGATIVE 01/27/2016 2250   UROBILINOGEN 0.2 02/09/2012 1753   NITRITE NEGATIVE 01/27/2016 2250   LEUKOCYTESUR NEGATIVE 01/27/2016 2250   LEUKOCYTESUR Negative 02/22/2015 1602   Sepsis Labs: @LABRCNTIP (procalcitonin:4,lacticidven:4) Microbiology: Recent Results (from the past 240 hours)  Blood Culture (routine x 2)     Status: None (Preliminary result)   Collection Time: 04/14/24  2:53 AM   Specimen: BLOOD  Result Value Ref Range Status   Specimen Description BLOOD UNKNOWN  Final   Special Requests   Final    BOTTLES DRAWN AEROBIC AND ANAEROBIC Blood Culture results may not be optimal due to an excessive volume of blood received in culture bottles   Culture   Final    NO GROWTH 1 DAY Performed at [facility], [address] [place], [place] [address]    Report Status PENDING  Incomplete  Blood Culture (routine x 2)     Status: None (Preliminary result)   Collection Time: 04/14/24  2:53 AM   Specimen: BLOOD  Result Value Ref Range Status   Specimen Description BLOOD UNKNOWN  Final   Special Requests   Final    BOTTLES DRAWN AEROBIC AND ANAEROBIC Blood Culture results may not be optimal due to an excessive volume of blood received in culture bottles   Culture   Final    NO GROWTH 1 DAY Performed at [facility], [address] [place], [place] [address]    Report Status PENDING  Incomplete  Aerobic/Anaerobic Culture w Gram Stain (surgical/deep wound)     Status: None (Preliminary result)   Collection Time: 04/14/24  4:46 AM   Specimen: Wound  Result Value Ref Range Status   Specimen Description   Final    WOUND Performed at [facility], [address], [place], [place] [address]    Special Requests   Final     LEFT FOOT Performed at [facility], [address] [place],  [place] [address]    Gram Stain   Final    RARE WBC PRESENT, PREDOMINANTLY PMN RARE GRAM POSITIVE COCCI IN PAIRS Performed at [facility], [address] [place], [place] [address]    Culture PENDING  Incomplete   Report Status PENDING  Incomplete      Radiology Studies last 3 days: US  ARTERIAL ABI (SCREENING LOWER EXTREMITY) Result Date: 04/15/2024 CLINICAL DATA:  [id number] PAD (peripheral artery disease) (HCC) [phone], hypertension, hyperlipidemia, diabetes, rest pain and claudication EXAM: NONINVASIVE PHYSIOLOGIC VASCULAR STUDY OF BILATERAL LOWER EXTREMITIES TECHNIQUE: Evaluation of both lower extremities were performed at rest, including calculation of ankle-brachial indices with single level Doppler, pressure and pulse volume recording. COMPARISON:  None Available. FINDINGS: Right ABI:  1.18 Left ABI:  1.23 Right Lower Extremity:  Multiphasic arterial waveforms at the ankle. Left Lower Extremity:  Multiphasic arterial waveforms at the ankle. IMPRESSION: Normal bilateral ABIs. Electronically Signed   By: JONETTA Faes M.D.   On: 04/15/2024 09:38   MRI Left foot without contrast Result Date: 04/14/2024 CLINICAL DATA:  Open wounds along the digits in ball of the foot. Erythema and swelling EXAM: MRI OF THE LEFT FOOT WITHOUT CONTRAST TECHNIQUE: Multiplanar, multisequence MR imaging of the left foot from the midfoot through the toes was performed. No intravenous contrast was administered. COMPARISON:  Radiographs 04/14/2024 FINDINGS: Bones/Joint/Cartilage Dorsal dislocation of the proximal phalanx third toe with respect to the metatarsal head. Marrow edema in the third metatarsal head as well as the proximal and middle phalanges of the third toe suspicious for osteomyelitis particularly given the trace amount of gas just dorsal to the third metatarsal head on image 19 series 10. Possible draining sinus tract extending from the  joint to the plantar surface on image 20 series 10. Mild nonspecific marrow edema in the head of the fourth metatarsal and base of the fourth proximal phalanx for example on image 11 series 9, potentially reactive or secondary to early osteomyelitis. Degenerative arthropathy between the head of the first metatarsal and the medial sesamoid associated low-grade marrow edema in both structures. Chronic appearing medial erosion of the head of the first metatarsal possibly from remote gout. Mild hallux valgus. Erosive arthropathy at the interphalangeal joint of the great toe. Ligaments Lisfranc ligament intact. Muscles and Tendons Moderate regional muscular atrophy. Soft tissues Dorsal subcutaneous edema in the forefoot. Plantar ulceration below the medial sesamoid of the first digit. Suspected draining sinus tract extending from the third MCP joint to the plantar surface. Subcutaneous edema extends into the second through fifth toes and may reflect cellulitis. IMPRESSION: 1. Dorsal dislocation of the proximal phalanx third toe with respect to the metatarsal head. Marrow edema in the third metatarsal head as well as the proximal and middle phalanges of the third toe suspicious for osteomyelitis particularly given the trace amount of gas just dorsal to the third metatarsal head and suspected draining sinus tract extending from the third MCP joint to the plantar surface. 2. Mild nonspecific marrow edema in the head of the fourth metatarsal and base of the fourth proximal phalanx, potentially reactive or secondary to early osteomyelitis. 3. Plantar ulceration below the medial sesamoid of the first digit. 4. Dorsal subcutaneous edema in the forefoot extending into the second through fifth toes, potentially cellulitis. 5. Degenerative arthropathy between the head of the first metatarsal and the medial sesamoid. 6. Chronic appearing medial erosion of the head of the first metatarsal possibly from remote gout. 7. Erosive  arthropathy at the interphalangeal joint  of the great toe. Electronically Signed   By: Ryan Salvage M.D.   On: 04/14/2024 08:52   DG Foot Complete Left Result Date: 04/14/2024 CLINICAL DATA:  Diabetic foot wounds.  Evaluate for osteomyelitis. EXAM: LEFT FOOT - COMPLETE 3+ VIEW COMPARISON:  None Available. FINDINGS: No acute bony abnormality. Specifically, no fracture, subluxation, or dislocation. No bone destruction to suggest osteomyelitis. IMPRESSION: No acute bony abnormality. Electronically Signed   By: Franky Crease M.D.   On: 04/14/2024 02:52   US  Venous Img Lower Unilateral Left Result Date: 04/13/2024 CLINICAL DATA:  Left lower extremity swelling. EXAM: LEFT LOWER EXTREMITY VENOUS DOPPLER ULTRASOUND TECHNIQUE: Gray-scale sonography with compression, as well as color and duplex ultrasound, were performed to evaluate the deep venous system(s) from the level of the common femoral vein through the popliteal and proximal calf veins. COMPARISON:  None Available. FINDINGS: VENOUS Normal compressibility of the common femoral, superficial femoral, and popliteal veins, as well as the visualized calf veins. Visualized portions of profunda femoral vein and great saphenous vein unremarkable. No filling defects to suggest DVT on grayscale or color Doppler imaging. Doppler waveforms show normal direction of venous flow, normal respiratory plasticity and response to augmentation. Limited views of the contralateral common femoral vein are unremarkable. OTHER Prominent left inguinal lymph node, but this maintains a normal central fatty hilum. Limitations: none IMPRESSION: No evidence of left lower extremity DVT. Electronically Signed   By: Franky Crease M.D.   On: 04/13/2024 23:00       Time spent: 25 min    Adel Burch, DO [facility] 04/15/2024, 2:54 PM    Dictation software may have been used to generate the above note. Typos may occur and escape review in typed/dictated notes. Please  contact Dr Marsa directly for clarity if needed.  Staff may message me via secure chat in Epic  but this may not receive an immediate response,  please page me for urgent matters!  If 7PM-7AM, please contact night coverage [email or web address]

## 2024-04-15 NOTE — Plan of Care (Signed)
  Problem: Education: Goal: Ability to describe self-care measures that may prevent or decrease complications (Diabetes Survival Skills Education) will improve Outcome: Progressing Goal: Individualized Educational Video(s) Outcome: Progressing   Problem: Coping: Goal: Ability to adjust to condition or change in health will improve Outcome: Progressing   Problem: Fluid Volume: Goal: Ability to maintain a balanced intake and output will improve Outcome: Progressing   Problem: Health Behavior/Discharge Planning: Goal: Ability to identify and utilize available resources and services will improve Outcome: Progressing Goal: Ability to manage health-related needs will improve Outcome: Progressing   Problem: Clinical Measurements: Goal: Ability to maintain clinical measurements within normal limits will improve Outcome: Progressing Goal: Will remain free from infection Outcome: Progressing Goal: Diagnostic test results will improve Outcome: Progressing Goal: Respiratory complications will improve Outcome: Progressing Goal: Cardiovascular complication will be avoided Outcome: Progressing

## 2024-04-15 NOTE — Consult Note (Signed)
 PODIATRY CONSULTATION  NAME Denise Webb MRN [id number] DOB 12/29/1965 DOA 04/14/2024   Reason for consult: LT foot cellulitis.  +OM LT third toe Chief Complaint  Patient presents with   Foot Pain    Consulting physician: Denise Webb, [facility]  History of present illness: 58 y.o. female PMHx IDDM w/ peripheral polyneuropathy, chronic diabetic ulcers, admitted for worsening infection of the left foot.  MRI of the left foot consistent with osteomyelitis.  Podiatry was consulted  Past Medical History:  Diagnosis Date   Abdominal pain, unspecified site 09/19/2013   Acute blood loss anemia 12/2015   Anxiety    Bilateral great toes ulcers (HCC)    Colitis    Depression    Diabetes mellitus without complication (HCC)    type 2    Diabetic peripheral neuropathy associated with type 2 diabetes mellitus (HCC) 06/24/2015   Fracture of right iliac wing (HCC) 01/27/2016   GERD (gastroesophageal reflux disease)    History of head injury 06/25/2015   Hyperlipidemia    Hypertension    IBS (irritable bowel syndrome) 2011-2014   ER visits only    Internal hemorrhoids    Lumbar radicular pain (Bilateral) (R>L) (Right L5 Dermatome) 10/09/2014   On the right leg to pain goes to the top of the foot and big toe following an L5 dermatomal distribution. On the left side it does not go all the way into the foot.    Neuropathy    severe both feet/lower legs   Plantar fasciitis    Sternal fracture 12/2015   TMJ (dislocation of temporomandibular joint)    Trichomoniasis        Latest Ref Rng & Units 04/15/2024    8:35 AM 04/13/2024    9:01 PM 07/23/2018    3:31 AM  CBC  WBC 4.0 - 10.5 K/uL 4.8  10.5  8.4   Hemoglobin 12.0 - 15.0 g/dL 8.8  89.0  89.7   Hematocrit 36.0 - 46.0 % 27.6  34.6  31.0   Platelets 150 - 400 K/uL 291  394  255        Latest Ref Rng & Units 04/15/2024    8:35 AM 04/13/2024    9:01 PM 07/23/2018    3:31 AM  BMP  Glucose 70 - 99 mg/dL 895  816  893    BUN 6 - 20 mg/dL 10  12  13    Creatinine 0.44 - 1.00 mg/dL 9.39  8.99  9.50   Sodium 135 - 145 mmol/L 138  133  140   Potassium 3.5 - 5.1 mmol/L 3.6  2.7  3.5   Chloride 98 - 111 mmol/L 105  92  109   CO2 22 - 32 mmol/L 28  24  27    Calcium  8.9 - 10.3 mg/dL 8.7  9.6  8.6     1/77/7974 LT foot  04/14/2024 LT foot   Physical Exam: General: The patient is alert and oriented x3 in no acute distress.   Dermatology: Ulcer is noted plantar aspect of the 1st and 3rd MTP left forefoot extending down to the level of joint capsule.  No obvious indication of bone exposure.  Surrounding callus noted with heavy erythema and edema noted diffusely throughout the forefoot extending into the more proximal portion of the foot and leg.  Mild malodor.  Vascular: US  ARTERIAL ABI (SCREENING LOWER EXTREMITY) 04/14/2024 FINDINGS: Right ABI:  1.18 Left ABI:  1.23 Right Lower Extremity:  Multiphasic arterial waveforms at the ankle. Left  Lower Extremity:  Multiphasic arterial waveforms at the ankle. IMPRESSION: Normal bilateral ABIs.  Neurological: Light touch and protective threshold diminished bilaterally.   Musculoskeletal Exam: Calf is soft and supple.  No pain with calf compression.  No crepitus.    DG Foot Complete Left 04/14/2024 FINDINGS: No acute bony abnormality. Specifically, no fracture, subluxation, or dislocation. No bone destruction to suggest osteomyelitis. IMPRESSION: No acute bony abnormality.  MRI Left foot without contrast 04/14/2024 IMPRESSION: 1. Dorsal dislocation of the proximal phalanx third toe with respect to the metatarsal head. Marrow edema in the third metatarsal head as well as the proximal and middle phalanges of the third toe suspicious for osteomyelitis particularly given the trace amount of gas just dorsal to the third metatarsal head and suspected draining sinus tract extending from the third MCP joint to the plantar surface. 2. Mild nonspecific marrow edema in the  head of the fourth metatarsal and base of the fourth proximal phalanx, potentially reactive or secondary to early osteomyelitis. 3. Plantar ulceration below the medial sesamoid of the first digit. 4. Dorsal subcutaneous edema in the forefoot extending into the second through fifth toes, potentially cellulitis. 5. Degenerative arthropathy between the head of the first metatarsal and the medial sesamoid. 6. Chronic appearing medial erosion of the head of the first metatarsal possibly from remote gout. 7. Erosive arthropathy at the interphalangeal joint of the great toe.  ASSESSMENT/PLAN OF CARE 1.  Plantar diabetic foot ulcers LT w/ cellulitis 2.  Underlying osteomyelitis third metatarsal head and toe w/ possible adjacent OM to the fourth metatarsal head  -Patient seen at bedside. -Discussed the findings of the MRI and recommend partial third ray amputation in combination with bone biopsy of the fourth metatarsal.  Procedure was discussed in detail with the patient.  No guarantees were expressed or implied.  All patient questions answered.  She is amenable to this plan -Patient would like to have surgery tomorrow, a.m. so she has time to discuss with her family. -Preoperative orders placed.  N.p.o. after midnight -Will follow   Thank you for the consult.  Please contact me directly via secure chat with any questions or concerns.     Denise Webb, DPM [facility]  Dr. Thresa EMERSON Webb, DPM    [address] [place], [place] [address]                Office [phone]  Fax [phone]

## 2024-04-15 NOTE — Plan of Care (Signed)
 Problem: Education: Goal: Ability to describe self-care measures that may prevent or decrease complications (Diabetes Survival Skills Education) will improve 04/15/2024 0735 by Caro Hoehn, RN Outcome: Progressing 04/14/2024 1758 by Caro Hoehn, RN Outcome: Progressing Goal: Individualized Educational Video(s) 04/15/2024 0735 by Caro Hoehn, RN Outcome: Progressing 04/14/2024 1758 by Caro Hoehn, RN Outcome: Progressing   Problem: Coping: Goal: Ability to adjust to condition or change in health will improve 04/15/2024 0735 by Caro Hoehn, RN Outcome: Progressing 04/14/2024 1758 by Caro Hoehn, RN Outcome: Progressing   Problem: Fluid Volume: Goal: Ability to maintain a balanced intake and output will improve 04/15/2024 0735 by Caro Hoehn, RN Outcome: Progressing 04/14/2024 1758 by Caro Hoehn, RN Outcome: Progressing   Problem: Health Behavior/Discharge Planning: Goal: Ability to identify and utilize available resources and services will improve 04/15/2024 0735 by Caro Hoehn, RN Outcome: Progressing 04/14/2024 1758 by Caro Hoehn, RN Outcome: Progressing Goal: Ability to manage health-related needs will improve 04/15/2024 0735 by Caro Hoehn, RN Outcome: Progressing 04/14/2024 1758 by Caro Hoehn, RN Outcome: Progressing   Problem: Metabolic: Goal: Ability to maintain appropriate glucose levels will improve 04/15/2024 0735 by Caro Hoehn, RN Outcome: Progressing 04/14/2024 1758 by Caro Hoehn, RN Outcome: Progressing   Problem: Nutritional: Goal: Maintenance of adequate nutrition will improve 04/15/2024 0735 by Caro Hoehn, RN Outcome: Progressing 04/14/2024 1758 by Caro Hoehn, RN Outcome: Progressing Goal: Progress toward achieving an optimal weight will improve 04/15/2024 0735 by Caro Hoehn, RN Outcome: Progressing 04/14/2024 1758 by Caro Hoehn, RN Outcome: Progressing   Problem: Skin Integrity: Goal: Risk for impaired skin integrity will decrease 04/15/2024 0735 by  Caro Hoehn, RN Outcome: Progressing 04/14/2024 1758 by Caro Hoehn, RN Outcome: Progressing   Problem: Tissue Perfusion: Goal: Adequacy of tissue perfusion will improve 04/15/2024 0735 by Caro Hoehn, RN Outcome: Progressing 04/14/2024 1758 by Caro Hoehn, RN Outcome: Progressing   Problem: Education: Goal: Knowledge of General Education information will improve Description: Including pain rating scale, medication(s)/side effects and non-pharmacologic comfort measures 04/15/2024 0735 by Caro Hoehn, RN Outcome: Progressing 04/14/2024 1758 by Caro Hoehn, RN Outcome: Progressing   Problem: Health Behavior/Discharge Planning: Goal: Ability to manage health-related needs will improve 04/15/2024 0735 by Caro Hoehn, RN Outcome: Progressing 04/14/2024 1758 by Caro Hoehn, RN Outcome: Progressing   Problem: Clinical Measurements: Goal: Ability to maintain clinical measurements within normal limits will improve 04/15/2024 0735 by Caro Hoehn, RN Outcome: Progressing 04/14/2024 1758 by Caro Hoehn, RN Outcome: Progressing Goal: Will remain free from infection 04/15/2024 0735 by Caro Hoehn, RN Outcome: Progressing 04/14/2024 1758 by Caro Hoehn, RN Outcome: Progressing Goal: Diagnostic test results will improve 04/15/2024 0735 by Caro Hoehn, RN Outcome: Progressing 04/14/2024 1758 by Caro Hoehn, RN Outcome: Progressing Goal: Respiratory complications will improve 04/15/2024 0735 by Caro Hoehn, RN Outcome: Progressing 04/14/2024 1758 by Caro Hoehn, RN Outcome: Progressing Goal: Cardiovascular complication will be avoided 04/15/2024 0735 by Caro Hoehn, RN Outcome: Progressing 04/14/2024 1758 by Caro Hoehn, RN Outcome: Progressing   Problem: Activity: Goal: Risk for activity intolerance will decrease 04/15/2024 0735 by Caro Hoehn, RN Outcome: Progressing 04/14/2024 1758 by Caro Hoehn, RN Outcome: Progressing   Problem: Nutrition: Goal: Adequate nutrition will be  maintained 04/15/2024 0735 by Caro Hoehn, RN Outcome: Progressing 04/14/2024 1758 by Caro Hoehn, RN Outcome: Progressing   Problem: Coping: Goal: Level of anxiety will decrease 04/15/2024 0735 by Caro Hoehn, RN Outcome: Progressing 04/14/2024 1758 by Caro Hoehn, RN Outcome: Progressing   Problem: Elimination: Goal: Will not experience complications related to bowel motility 04/15/2024 0735  by Caro Hoehn, RN Outcome: Progressing 04/14/2024 1758 by Caro Hoehn, RN Outcome: Progressing Goal: Will not experience complications related to urinary retention 04/15/2024 0735 by Caro Hoehn, RN Outcome: Progressing 04/14/2024 1758 by Caro Hoehn, RN Outcome: Progressing   Problem: Pain Managment: Goal: General experience of comfort will improve and/or be controlled 04/15/2024 0735 by Caro Hoehn, RN Outcome: Progressing 04/14/2024 1758 by Caro Hoehn, RN Outcome: Progressing   Problem: Safety: Goal: Ability to remain free from injury will improve 04/15/2024 0735 by Caro Hoehn, RN Outcome: Progressing 04/14/2024 1758 by Caro Hoehn, RN Outcome: Progressing   Problem: Skin Integrity: Goal: Risk for impaired skin integrity will decrease 04/15/2024 0735 by Caro Hoehn, RN Outcome: Progressing 04/14/2024 1758 by Caro Hoehn, RN Outcome: Progressing

## 2024-04-15 NOTE — Progress Notes (Signed)
 PROGRESS NOTE    Denise Webb   FMW:[id number] DOB: 1965-09-27  DOA: 04/14/2024 Date of Service: 04/15/24 which is hospital day 1  PCP: [facility]    Hospital course / significant events:   HPI: Denise Webb is a 58 y.o. female with medical history significant of insulin -dependent diabetes mellitus diabetic peripheral neuropathy complicated by chronic diabetic ulcers, hypertension, hyperlipidemia, chronic pain disorder depression migraine headaches and irritable bowel syndrome.  Patient presented to the emergency room 08/21 evening accompanied by husband.  Chief complaints of left lower extremity swelling and erythema and pain.  She has hide nonhealing ulcers at the base of the foot.  Recently noted blistering of toes and noticed acute swelling of lower extremity with surrounding erythema  08/21: to ED. Started abx. DVT r/o. MRI pending.  08/22: admitted to hospitalist AM. MRI (+)osteo. Podiatry to see.  08/23: partial third ray amputation in combination with bone biopsy of the fourth metatarsal planned for tomorrow      Consultants:  Podiatry  Procedures/Surgeries: none      ASSESSMENT & PLAN:   #Diabetic foot ulcers  #surrounding left lower extremity cellulitis #Osteomyelitis  antibiotics with unasyn  and vancomycin .   Cultures ordered prior to initiating IV antibiotics.   Per podiatry: NPO after midnight for planned partial third ray amputation in combination with bone biopsy of the fourth metatarsal tomorrow    #Severe hypokalemia resolved  Potassium replacement protocol   #Chronic pain syndrome:  Confirmed w/ PDMP pt is filling Rx for Oxycodone  20 mg #90 x30 days, filling regularly  oxycodone  20 mg tid here  Dilaudid  for breakthrough    #Insulin -dependent diabetes mellitus: Complicated by diabetic neuropathy.   on Ozempic at home.   Insulin  sliding scale   #Hypertension: Resume home medications as appropriate. Contineu home  atenolol  - reduced dose but to avoid rebound tachycardia will not hold altogether unless significantly low BP, consider weaning off altogether     #Anxiety disorder/depression:  Continue wellbutrin     none based on BMI: Body mass index is 23.72 kg/m.SABRA Significantly low or high BMI is associated with higher medical risk.  Underweight - under 18  overweight - 25 to 29 obese - 30 or more Class 1 obesity: BMI of 30.0 to 34 Class 2 obesity: BMI of 35.0 to 39 Class 3 obesity: BMI of 40.0 to 49 Super Morbid Obesity: BMI 50-59 Super-super Morbid Obesity: BMI 60+ Healthy nutrition and physical activity advised as adjunct to other disease management and risk reduction treatments    DVT prophylaxis: lovenox   IV fluids: no continuous IV fluids  Nutrition: carb modified Central lines / other devices: none  Code Status: FULL CODE ACP documentation reviewed: none on file in [facility]  TOC needs: TBD Medical barriers to dispo: osteo needing IV abx and surgery . Expected medical readiness for discharge pending postoperative course but hopefully tomorrow 08/24             Subjective / Brief ROS:  Patient reports pain in foot Denies CP/SOB.  Denies new weakness.  Tolerating diet .  Reports no concerns w/ urination/defecation.   Family Communication: none at this time     Objective Findings:  Vitals:   04/14/24 2029 04/15/24 0353 04/15/24 0738 04/15/24 1208  BP: 94/67 101/74 93/68 108/77  Pulse: 93 94 95   Resp: 18 17 17    Temp: 98.3 F (36.8 C) 98 F (36.7 C) 97.8 F (36.6 C)   TempSrc: Oral Oral Oral   SpO2: 96% 99%  99%   Weight:      Height:        Intake/Output Summary (Last 24 hours) at 04/15/2024 1454 Last data filed at 04/15/2024 9666 Gross per 24 hour  Intake 1629.41 ml  Output --  Net 1629.41 ml   Filed Weights   04/13/24 2058  Weight: 70.8 kg    Examination:  Physical Exam Constitutional:      General: She is not in acute distress. Neurological:      Mental Status: She is alert.          Scheduled Medications:   acidophilus  1 capsule Oral Daily   atenolol   12.5 mg Oral Daily   atorvastatin   40 mg Oral Daily   buPROPion   100 mg Oral BID   calcium  acetate  1,334 mg Oral TID WC   docusate sodium   100 mg Oral BID   enoxaparin  (LOVENOX ) injection  40 mg Subcutaneous Q24H   insulin  aspart  0-15 Units Subcutaneous TID WC   oxyCODONE   20 mg Oral TID   pantoprazole   40 mg Oral Daily   sodium chloride  flush  3 mL Intravenous Q12H   traZODone   100 mg Oral QHS    Continuous Infusions:  ampicillin -sulbactam (UNASYN ) IV 3 g (04/15/24 1301)   vancomycin  750 mg (04/15/24 0626)    PRN Medications:  albuterol , ALPRAZolam , aspirin -acetaminophen -caffeine , cyclobenzaprine , HYDROmorphone  (DILAUDID ) injection  Antimicrobials from admission:  Anti-infectives (From admission, onward)    Start     Dose/Rate Route Frequency Ordered Stop   04/14/24 1800  vancomycin  (VANCOREADY) IVPB 750 mg/150 mL       Note to Pharmacy: Pharmacy to dose   750 mg 150 mL/hr over 60 Minutes Intravenous Every 12 hours 04/14/24 0536     04/14/24 1200  Ampicillin -Sulbactam (UNASYN ) 3 g in sodium chloride  0.9 % 100 mL IVPB        3 g 200 mL/hr over 30 Minutes Intravenous Every 6 hours 04/14/24 0525     04/14/24 0530  vancomycin  (VANCOCIN ) IVPB 1000 mg/200 mL premix  Status:  Discontinued       Note to Pharmacy: Pharmacy to dose   1,000 mg 200 mL/hr over 60 Minutes Intravenous Every 24 hours 04/14/24 0525 04/14/24 0536   04/14/24 0445  vancomycin  (VANCOREADY) IVPB 1750 mg/350 mL        1,750 mg 175 mL/hr over 120 Minutes Intravenous  Once 04/14/24 0436 04/14/24 0927   04/14/24 0430  Ampicillin -Sulbactam (UNASYN ) 3 g in sodium chloride  0.9 % 100 mL IVPB       Placed in And Linked Group   3 g 200 mL/hr over 30 Minutes Intravenous  Once 04/14/24 0417 04/14/24 0744   04/14/24 0430  vancomycin  (VANCOCIN ) IVPB 1000 mg/200 mL premix  Status:  Discontinued        Placed in And Linked Group   1,000 mg 200 mL/hr over 60 Minutes Intravenous  Once 04/14/24 0417 04/14/24 0435           Data Reviewed:  I have personally reviewed the following...  CBC: Recent Labs  Lab 04/13/24 2101 04/15/24 0835  WBC 10.5 4.8  HGB 10.9* 8.8*  HCT 34.6* 27.6*  MCV 86.9 87.6  PLT 394 291   Basic Metabolic Panel: Recent Labs  Lab 04/13/24 2101 04/15/24 0835  NA 133* 138  K 2.7* 3.6  CL 92* 105  CO2 24 28  GLUCOSE 183* 104*  BUN 12 10  CREATININE 1.00 0.60  CALCIUM  9.6 8.7*  MG  1.7  --    GFR: Estimated Creatinine Clearance: 79.2 mL/min (by C-G formula based on SCr of 0.6 mg/dL). Liver Function Tests: No results for input(s): AST, ALT, ALKPHOS, BILITOT, PROT, ALBUMIN in the last 168 hours. No results for input(s): LIPASE, AMYLASE in the last 168 hours. No results for input(s): AMMONIA in the last 168 hours. Coagulation Profile: No results for input(s): INR, PROTIME in the last 168 hours. Cardiac Enzymes: No results for input(s): CKTOTAL, CKMB, CKMBINDEX, TROPONINI in the last 168 hours. BNP (last 3 results) No results for input(s): PROBNP in the last 8760 hours. HbA1C: Recent Labs    04/14/24 0648  HGBA1C 6.6*   CBG: Recent Labs  Lab 04/14/24 1220 04/14/24 1612 04/14/24 2118 04/15/24 0739 04/15/24 1135  GLUCAP 87 97 137* 92 81   Lipid Profile: No results for input(s): CHOL, HDL, LDLCALC, TRIG, CHOLHDL, LDLDIRECT in the last 72 hours. Thyroid Function Tests: No results for input(s): TSH, T4TOTAL, FREET4, T3FREE, THYROIDAB in the last 72 hours. Anemia Panel: No results for input(s): VITAMINB12, FOLATE, FERRITIN, TIBC, IRON, RETICCTPCT in the last 72 hours. Most Recent Urinalysis On File:     Component Value Date/Time   COLORURINE STRAW (A) 01/27/2016 2250   APPEARANCEUR CLEAR 01/27/2016 2250   APPEARANCEUR Clear 02/22/2015 1602   LABSPEC 1.008 01/27/2016  2250   PHURINE 6.5 01/27/2016 2250   GLUCOSEU NEGATIVE 01/27/2016 2250   HGBUR NEGATIVE 01/27/2016 2250   BILIRUBINUR NEGATIVE 01/27/2016 2250   BILIRUBINUR Negative 02/22/2015 1602   KETONESUR NEGATIVE 01/27/2016 2250   PROTEINUR NEGATIVE 01/27/2016 2250   UROBILINOGEN 0.2 02/09/2012 1753   NITRITE NEGATIVE 01/27/2016 2250   LEUKOCYTESUR NEGATIVE 01/27/2016 2250   LEUKOCYTESUR Negative 02/22/2015 1602   Sepsis Labs: @LABRCNTIP (procalcitonin:4,lacticidven:4) Microbiology: Recent Results (from the past 240 hours)  Blood Culture (routine x 2)     Status: None (Preliminary result)   Collection Time: 04/14/24  2:53 AM   Specimen: BLOOD  Result Value Ref Range Status   Specimen Description BLOOD UNKNOWN  Final   Special Requests   Final    BOTTLES DRAWN AEROBIC AND ANAEROBIC Blood Culture results may not be optimal due to an excessive volume of blood received in culture bottles   Culture   Final    NO GROWTH 1 DAY Performed at [facility], [address] [place], [place] [address]    Report Status PENDING  Incomplete  Blood Culture (routine x 2)     Status: None (Preliminary result)   Collection Time: 04/14/24  2:53 AM   Specimen: BLOOD  Result Value Ref Range Status   Specimen Description BLOOD UNKNOWN  Final   Special Requests   Final    BOTTLES DRAWN AEROBIC AND ANAEROBIC Blood Culture results may not be optimal due to an excessive volume of blood received in culture bottles   Culture   Final    NO GROWTH 1 DAY Performed at [facility], [address] [place], [place] [address]    Report Status PENDING  Incomplete  Aerobic/Anaerobic Culture w Gram Stain (surgical/deep wound)     Status: None (Preliminary result)   Collection Time: 04/14/24  4:46 AM   Specimen: Wound  Result Value Ref Range Status   Specimen Description   Final    WOUND Performed at [facility], [address], [place], [place] [address]    Special Requests   Final     LEFT FOOT Performed at [facility], [address] [place],  [place] [address]    Gram Stain   Final    RARE WBC PRESENT, PREDOMINANTLY PMN RARE GRAM POSITIVE COCCI IN PAIRS Performed at [facility], [address] [place], [place] [address]    Culture PENDING  Incomplete   Report Status PENDING  Incomplete      Radiology Studies last 3 days: US  ARTERIAL ABI (SCREENING LOWER EXTREMITY) Result Date: 04/15/2024 CLINICAL DATA:  [id number] PAD (peripheral artery disease) (HCC) [phone], hypertension, hyperlipidemia, diabetes, rest pain and claudication EXAM: NONINVASIVE PHYSIOLOGIC VASCULAR STUDY OF BILATERAL LOWER EXTREMITIES TECHNIQUE: Evaluation of both lower extremities were performed at rest, including calculation of ankle-brachial indices with single level Doppler, pressure and pulse volume recording. COMPARISON:  None Available. FINDINGS: Right ABI:  1.18 Left ABI:  1.23 Right Lower Extremity:  Multiphasic arterial waveforms at the ankle. Left Lower Extremity:  Multiphasic arterial waveforms at the ankle. IMPRESSION: Normal bilateral ABIs. Electronically Signed   By: JONETTA Faes M.D.   On: 04/15/2024 09:38   MRI Left foot without contrast Result Date: 04/14/2024 CLINICAL DATA:  Open wounds along the digits in ball of the foot. Erythema and swelling EXAM: MRI OF THE LEFT FOOT WITHOUT CONTRAST TECHNIQUE: Multiplanar, multisequence MR imaging of the left foot from the midfoot through the toes was performed. No intravenous contrast was administered. COMPARISON:  Radiographs 04/14/2024 FINDINGS: Bones/Joint/Cartilage Dorsal dislocation of the proximal phalanx third toe with respect to the metatarsal head. Marrow edema in the third metatarsal head as well as the proximal and middle phalanges of the third toe suspicious for osteomyelitis particularly given the trace amount of gas just dorsal to the third metatarsal head on image 19 series 10. Possible draining sinus tract extending from the  joint to the plantar surface on image 20 series 10. Mild nonspecific marrow edema in the head of the fourth metatarsal and base of the fourth proximal phalanx for example on image 11 series 9, potentially reactive or secondary to early osteomyelitis. Degenerative arthropathy between the head of the first metatarsal and the medial sesamoid associated low-grade marrow edema in both structures. Chronic appearing medial erosion of the head of the first metatarsal possibly from remote gout. Mild hallux valgus. Erosive arthropathy at the interphalangeal joint of the great toe. Ligaments Lisfranc ligament intact. Muscles and Tendons Moderate regional muscular atrophy. Soft tissues Dorsal subcutaneous edema in the forefoot. Plantar ulceration below the medial sesamoid of the first digit. Suspected draining sinus tract extending from the third MCP joint to the plantar surface. Subcutaneous edema extends into the second through fifth toes and may reflect cellulitis. IMPRESSION: 1. Dorsal dislocation of the proximal phalanx third toe with respect to the metatarsal head. Marrow edema in the third metatarsal head as well as the proximal and middle phalanges of the third toe suspicious for osteomyelitis particularly given the trace amount of gas just dorsal to the third metatarsal head and suspected draining sinus tract extending from the third MCP joint to the plantar surface. 2. Mild nonspecific marrow edema in the head of the fourth metatarsal and base of the fourth proximal phalanx, potentially reactive or secondary to early osteomyelitis. 3. Plantar ulceration below the medial sesamoid of the first digit. 4. Dorsal subcutaneous edema in the forefoot extending into the second through fifth toes, potentially cellulitis. 5. Degenerative arthropathy between the head of the first metatarsal and the medial sesamoid. 6. Chronic appearing medial erosion of the head of the first metatarsal possibly from remote gout. 7. Erosive  arthropathy at the interphalangeal joint  of the great toe. Electronically Signed   By: Ryan Salvage M.D.   On: 04/14/2024 08:52   DG Foot Complete Left Result Date: 04/14/2024 CLINICAL DATA:  Diabetic foot wounds.  Evaluate for osteomyelitis. EXAM: LEFT FOOT - COMPLETE 3+ VIEW COMPARISON:  None Available. FINDINGS: No acute bony abnormality. Specifically, no fracture, subluxation, or dislocation. No bone destruction to suggest osteomyelitis. IMPRESSION: No acute bony abnormality. Electronically Signed   By: Franky Crease M.D.   On: 04/14/2024 02:52   US  Venous Img Lower Unilateral Left Result Date: 04/13/2024 CLINICAL DATA:  Left lower extremity swelling. EXAM: LEFT LOWER EXTREMITY VENOUS DOPPLER ULTRASOUND TECHNIQUE: Gray-scale sonography with compression, as well as color and duplex ultrasound, were performed to evaluate the deep venous system(s) from the level of the common femoral vein through the popliteal and proximal calf veins. COMPARISON:  None Available. FINDINGS: VENOUS Normal compressibility of the common femoral, superficial femoral, and popliteal veins, as well as the visualized calf veins. Visualized portions of profunda femoral vein and great saphenous vein unremarkable. No filling defects to suggest DVT on grayscale or color Doppler imaging. Doppler waveforms show normal direction of venous flow, normal respiratory plasticity and response to augmentation. Limited views of the contralateral common femoral vein are unremarkable. OTHER Prominent left inguinal lymph node, but this maintains a normal central fatty hilum. Limitations: none IMPRESSION: No evidence of left lower extremity DVT. Electronically Signed   By: Franky Crease M.D.   On: 04/13/2024 23:00       Time spent: 25 min    Adel Burch, DO [facility] 04/15/2024, 2:54 PM    Dictation software may have been used to generate the above note. Typos may occur and escape review in typed/dictated notes. Please  contact Dr Marsa directly for clarity if needed.  Staff may message me via secure chat in Epic  but this may not receive an immediate response,  please page me for urgent matters!  If 7PM-7AM, please contact night coverage [email or web address]

## 2024-04-16 ENCOUNTER — Inpatient Hospital Stay

## 2024-04-16 ENCOUNTER — Inpatient Hospital Stay: Admitting: Anesthesiology

## 2024-04-16 ENCOUNTER — Other Ambulatory Visit: Payer: Self-pay

## 2024-04-16 ENCOUNTER — Encounter
Admission: EM | Disposition: A | Discharge status: Home / Self Care | Payer: Self-pay | Source: Home / Self Care | Attending: Osteopathic Medicine

## 2024-04-16 DIAGNOSIS — L03116 Cellulitis of left lower limb: Secondary | ICD-10-CM | POA: Diagnosis not present

## 2024-04-16 DIAGNOSIS — L97512 Non-pressure chronic ulcer of other part of right foot with fat layer exposed: Secondary | ICD-10-CM

## 2024-04-16 DIAGNOSIS — L97522 Non-pressure chronic ulcer of other part of left foot with fat layer exposed: Secondary | ICD-10-CM

## 2024-04-16 HISTORY — PX: AMPUTATION TOE: SHX6595

## 2024-04-16 LAB — CBC
HCT: 28.4 % — ABNORMAL LOW (ref 36.0–46.0)
Hemoglobin: 9 g/dL — ABNORMAL LOW (ref 12.0–15.0)
MCH: 27.6 pg (ref 26.0–34.0)
MCHC: 31.7 g/dL (ref 30.0–36.0)
MCV: 87.1 fL (ref 80.0–100.0)
Platelets: 321 K/uL (ref 150–400)
RBC: 3.26 MIL/uL — ABNORMAL LOW (ref 3.87–5.11)
RDW: 14.6 % (ref 11.5–15.5)
WBC: 4.3 K/uL (ref 4.0–10.5)
nRBC: 0 % (ref 0.0–0.2)

## 2024-04-16 LAB — BASIC METABOLIC PANEL WITH GFR
Anion gap: 11 (ref 5–15)
BUN: 5 mg/dL — ABNORMAL LOW (ref 6–20)
CO2: 23 mmol/L (ref 22–32)
Calcium: 8.8 mg/dL — ABNORMAL LOW (ref 8.9–10.3)
Chloride: 107 mmol/L (ref 98–111)
Creatinine, Ser: 0.59 mg/dL (ref 0.44–1.00)
GFR, Estimated: >60 mL/min (ref >60)
Glucose, Bld: 85 mg/dL (ref 70–99)
Potassium: 3.5 mmol/L (ref 3.5–5.1)
Sodium: 141 mmol/L (ref 135–145)

## 2024-04-16 LAB — GLUCOSE, CAPILLARY
Glucose-Capillary: 78 mg/dL (ref 70–99)
Glucose-Capillary: 78 mg/dL (ref 70–99)
Glucose-Capillary: 87 mg/dL (ref 70–99)
Glucose-Capillary: 69 mg/dL — ABNORMAL LOW (ref 70–99)

## 2024-04-16 SURGERY — AMPUTATION, TOE
Anesthesia: General | Site: Toe | Laterality: Left

## 2024-04-16 MED ORDER — PHENYLEPHRINE 80 MCG/ML (10ML) SYRINGE FOR IV PUSH (FOR BLOOD PRESSURE SUPPORT)
PREFILLED_SYRINGE | INTRAVENOUS | Status: DC | PRN
  Administered 2024-04-16 (×3): 100 ug via INTRAVENOUS

## 2024-04-16 MED ORDER — DROPERIDOL 2.5 MG/ML IJ SOLN: 0.6250 mg | Freq: Once | INTRAMUSCULAR | Status: DC | PRN

## 2024-04-16 MED ORDER — BUPIVACAINE HCL 0.5 % IJ SOLN
INTRAMUSCULAR | Status: DC | PRN
  Administered 2024-04-16: 10 mL

## 2024-04-16 MED ORDER — OXYCODONE HCL 5 MG/5ML PO SOLN: 5.0000 mg | Freq: Once | ORAL | Status: DC | PRN

## 2024-04-16 MED ORDER — FENTANYL CITRATE (PF) 100 MCG/2ML IJ SOLN: 25.0000 ug | INTRAMUSCULAR | Status: DC | PRN

## 2024-04-16 MED ORDER — MIDAZOLAM HCL 2 MG/2ML IJ SOLN
INTRAMUSCULAR | Status: DC | PRN
  Administered 2024-04-16: 4 mg via INTRAVENOUS

## 2024-04-16 MED ORDER — OXYCODONE HCL 5 MG PO TABS: 5.0000 mg | ORAL_TABLET | Freq: Once | ORAL | Status: DC | PRN

## 2024-04-16 MED ORDER — FENTANYL CITRATE (PF) 100 MCG/2ML IJ SOLN
INTRAMUSCULAR | Status: AC
  Filled 2024-04-16: qty 2

## 2024-04-16 MED ORDER — PROPOFOL 10 MG/ML IV BOLUS
INTRAVENOUS | Status: AC
  Filled 2024-04-16: qty 20

## 2024-04-16 MED ORDER — MIDAZOLAM HCL 2 MG/2ML IJ SOLN
INTRAMUSCULAR | Status: AC
  Filled 2024-04-16: qty 2

## 2024-04-16 MED ORDER — VANCOMYCIN HCL 1000 MG IV SOLR
INTRAVENOUS | Status: AC
  Filled 2024-04-16: qty 20

## 2024-04-16 MED ORDER — PROPOFOL 10 MG/ML IV BOLUS
INTRAVENOUS | Status: AC
  Filled 2024-04-16: qty 40

## 2024-04-16 MED ORDER — ACETAMINOPHEN 10 MG/ML IV SOLN: 1000.0000 mg | Freq: Once | INTRAVENOUS | Status: DC | PRN

## 2024-04-16 MED ORDER — FENTANYL CITRATE (PF) 100 MCG/2ML IJ SOLN
INTRAMUSCULAR | Status: DC | PRN
  Administered 2024-04-16: 100 ug via INTRAVENOUS

## 2024-04-16 MED ORDER — ACETAMINOPHEN-CAFFEINE 500-65 MG PO TABS
1.0000 | ORAL_TABLET | Freq: Four times a day (QID) | ORAL | Status: DC | PRN
  Administered 2024-04-16 – 2024-04-17 (×3): 1 via ORAL
  Filled 2024-04-16 (×4): qty 1

## 2024-04-16 MED ORDER — DEXMEDETOMIDINE HCL IN NACL 80 MCG/20ML IV SOLN
INTRAVENOUS | Status: DC | PRN
  Administered 2024-04-16: 12 ug via INTRAVENOUS

## 2024-04-16 MED ORDER — SODIUM CHLORIDE 0.9 % IV SOLN
INTRAVENOUS | Status: DC | PRN
Start: 2024-04-16 — End: 2024-04-16

## 2024-04-16 MED ORDER — BUPIVACAINE HCL (PF) 0.5 % IJ SOLN
INTRAMUSCULAR | Status: AC
Start: 2024-04-16 — End: 2024-04-16
  Filled 2024-04-16: qty 30

## 2024-04-16 MED ORDER — PROPOFOL 500 MG/50ML IV EMUL
INTRAVENOUS | Status: DC | PRN
  Administered 2024-04-16: 150 ug/kg/min via INTRAVENOUS

## 2024-04-16 MED ORDER — LIDOCAINE HCL (PF) 1 % IJ SOLN
INTRAMUSCULAR | Status: AC
  Filled 2024-04-16: qty 30

## 2024-04-16 MED ORDER — 0.9 % SODIUM CHLORIDE (POUR BTL) OPTIME
TOPICAL | Status: DC | PRN
  Administered 2024-04-16: 500 mL

## 2024-04-16 MED ORDER — ONDANSETRON HCL 4 MG/2ML IJ SOLN
INTRAMUSCULAR | Status: DC | PRN
  Administered 2024-04-16: 4 mg via INTRAVENOUS

## 2024-04-16 SURGICAL SUPPLY — 33 items
BLADE MED AGGRESSIVE (BLADE) IMPLANT
BLADE SURG 15 STRL SS SAFETY (BLADE) IMPLANT
BLADE SURG MINI STRL (BLADE) IMPLANT
BNDG ELASTIC 4INX 5YD STR LF (GAUZE/BANDAGES/DRESSINGS) ×1 IMPLANT
BNDG ESMARCH 4X12 STRL LF (GAUZE/BANDAGES/DRESSINGS) ×1 IMPLANT
BNDG GAUZE DERMACEA FLUFF 4 (GAUZE/BANDAGES/DRESSINGS) ×1 IMPLANT
CNTNR URN SCR LID CUP LEK RST (MISCELLANEOUS) ×1 IMPLANT
CUFF TOURN SGL QUICK 18X4 (TOURNIQUET CUFF) IMPLANT
DURAPREP 26ML APPLICATOR (WOUND CARE) ×1 IMPLANT
ELECTRODE REM PT RTRN 9FT ADLT (ELECTROSURGICAL) ×1 IMPLANT
GAUZE SPONGE 4X4 12PLY STRL (GAUZE/BANDAGES/DRESSINGS) ×2 IMPLANT
GAUZE XEROFORM 1X8 LF (GAUZE/BANDAGES/DRESSINGS) ×1 IMPLANT
GLOVE BIO SURGEON STRL SZ8 (GLOVE) ×1 IMPLANT
GLOVE BIOGEL PI IND STRL 8 (GLOVE) ×1 IMPLANT
GOWN STRL REUS W/ TWL LRG LVL3 (GOWN DISPOSABLE) ×2 IMPLANT
KIT TURNOVER KIT A (KITS) ×1 IMPLANT
MANIFOLD NEPTUNE II (INSTRUMENTS) ×1 IMPLANT
NDL BIOPSY JAMSHIDI 11X6 (NEEDLE) IMPLANT
NDL HYPO 25X1 1.5 SAFETY (NEEDLE) ×2 IMPLANT
NEEDLE BIOPSY JAMSHIDI 11X6 (NEEDLE) ×1 IMPLANT
NEEDLE HYPO 25X1 1.5 SAFETY (NEEDLE) ×2 IMPLANT
NS IRRIG 500ML POUR BTL (IV SOLUTION) ×1 IMPLANT
PACK EXTREMITY ARMC (MISCELLANEOUS) ×1 IMPLANT
PENCIL SMOKE EVACUATOR (MISCELLANEOUS) ×1 IMPLANT
SOLUTION PREP PVP 2OZ (MISCELLANEOUS) ×1 IMPLANT
STOCKINETTE STRL 6IN 960660 (GAUZE/BANDAGES/DRESSINGS) ×1 IMPLANT
SUT PROLENE 2 0 FS (SUTURE) IMPLANT
SUT PROLENE 3 0 PS 2 (SUTURE) IMPLANT
SUTURE EHLN 3-0 FS-10 30 BLK (SUTURE) ×2 IMPLANT
SWAB CULTURE AMIES ANAERIB BLU (MISCELLANEOUS) IMPLANT
SYR 10ML LL (SYRINGE) ×1 IMPLANT
TRAP FLUID SMOKE EVACUATOR (MISCELLANEOUS) ×1 IMPLANT
WATER STERILE IRR 500ML POUR (IV SOLUTION) ×1 IMPLANT

## 2024-04-16 NOTE — Anesthesia Preprocedure Evaluation (Signed)
 Anesthesia Evaluation  Patient identified by MRN, date of birth, ID band Patient awake    Reviewed: Allergy & Precautions, H&P , NPO status , Patient's Chart, lab work & pertinent test results, reviewed documented beta blocker date and time   Airway Mallampati: II  TM Distance: >3 FB Neck ROM: full    Dental  (+) Teeth Intact   Pulmonary sleep apnea , Current Smoker   Pulmonary exam normal        Cardiovascular Exercise Tolerance: Poor hypertension, On Medications negative cardio ROS Normal cardiovascular exam Rate:Normal     Neuro/Psych  PSYCHIATRIC DISORDERS Anxiety Depression     Neuromuscular disease    GI/Hepatic Neg liver ROS, hiatal hernia,GERD  Medicated,,  Endo/Other  negative endocrine ROSdiabetes, Poorly Controlled    Renal/GU negative Renal ROS  negative genitourinary   Musculoskeletal   Abdominal   Peds  Hematology  (+) Blood dyscrasia, anemia   Anesthesia Other Findings   Reproductive/Obstetrics negative OB ROS                              Anesthesia Physical Anesthesia Plan  ASA: 3 and emergent  Anesthesia Plan: General LMA   Post-op Pain Management:    Induction:   PONV Risk Score and Plan:   Airway Management Planned:   Additional Equipment:   Intra-op Plan:   Post-operative Plan:   Informed Consent: I have reviewed the patients History and Physical, chart, labs and discussed the procedure including the risks, benefits and alternatives for the proposed anesthesia with the patient or authorized representative who has indicated his/her understanding and acceptance.       Plan Discussed with: CRNA  Anesthesia Plan Comments:         Anesthesia Quick Evaluation

## 2024-04-16 NOTE — Op Note (Signed)
 OPERATIVE REPORT Patient name: Shawneequa Baldridge MRN: [id number] DOB: 06-Apr-1966  DOS: 04/16/24  Preop Dx: Osteomyelitis third ray left foot.  Suspicion for osteomyelitis fourth metatarsal left foot Postop Dx: same  Procedure:  1.  Partial third ray amputation left 2.  Bone biopsy fourth metatarsal left  Surgeon: Thresa EMERSON Sar DPM  Anesthesia: General Anesthesia in combination with 10 mL of a 50-50 mixture of 2% lidocaine  plain with 0.5% Marcaine  plain infiltrated around the patient's left midfoot  Hemostasis: Calf tourniquet inflated to a pressure of after esmarch exsanguination   EBL: 10 mL Materials: None Injectables: None Pathology: 3rd metatarsal sent for BOTH culture and pathology. 4th metatarsal bone sent for pathology. Soft tissue swab sent for C&S  Condition: The patient tolerated the procedure and anesthesia well. No complications noted or reported   Justification for procedure: The patient is a 58 y.o. female who presents today for surgical correction of osteomyelitis to the left forefoot. The patient was told benefits as well as possible side effects of the surgery. The patient consented for surgical correction. The patient consent form was reviewed. All patient questions were answered. No guarantees were expressed or implied. The patient and the surgeon both signed the patient consent form with the witness present and placed in the patient's chart.   Procedure in Detail: The patient was brought to the operating room, placed in the operating table in the supine position at which time an aseptic scrub and drape were performed about the patient's respective lower extremity after anesthesia was induced as described above. Attention was then directed to the surgical area where procedure number one commenced.  Procedure #1: Partial third ray amputation of the left A racquet type incision was planned and made encompassing the third toe of the left foot using #15  scalpel.  The incision was carried down to the level of the metatarsal bone and after incision overlying the MTP is significant amount of purulence was noted within the septic joint.  The toe was distracted distally and disarticulated at the level of the MTP and removed in toto.  Soft tissue cultures were taken.  Any necrotic nonviable tissue within the amputation site was excisionally removed down to healthier tissue.  Additional dissection using #15 scalpel was performed to expose the distal portion of the third metatarsal which was consistent with osteomyelitis based on MRI.  An osteotomy was created at the more proximal portion of the third metatarsal and the distal portion of the metatarsal was removed in toto and placed in a sterile specimen container and sent for bone culture.  Copious irrigation was then utilized and using clean instrumentation with a clean sagittal saw blade an additional 2 mm slice of bone was harvested from the exposed portion of the osteotomy site and sent for path.  Additional irrigation utilized in preparation for primary closure.  Primary closure was achieved using a combination of 2-0 and 3-0 Prolene suture.  Procedure #2: Bone biopsy fourth metatarsal left Through a separate entry point a percutaneous incision was planned and made overlying the neck and head of the fourth metatarsal of the left foot.  Decision was made to perform bone biopsy due to findings on MRI concerning for osteomyelitis.  Jamshidi bone biopsy needle was utilized to harvest a 2 mm core of bone taken from the distal metaphysis of the fourth metatarsal.  This was placed in a sterile specimen container and sent for pathology.  No suture was utilized since this was a small percutaneous  incision approximately 2 mm in length.    Dry sterile compressive dressings were then applied to all previously mentioned incision sites about the patient's lower extremity. The tourniquet which was used for hemostasis was  deflated. All normal neurovascular responses including pink color and warmth returned all the digits of patient's lower extremity.  The patient was then transferred from the operating room to the recovery room having tolerated the procedure and anesthesia well. All vital signs are stable. After a brief stay in the recovery room the patient was readmitted to inpatient room with postoperative orders placed.    IMPRESSION:   Thresa EMERSON Sar, DPM [facility]  Dr. Thresa EMERSON Sar, DPM    [address] [place], [place] [address]                Office [phone]  Fax [phone]

## 2024-04-16 NOTE — Interval H&P Note (Signed)
 History and Physical Interval Note:  04/16/2024 9:47 AM  Denise Webb  has presented today for surgery, with the diagnosis of Osteomyelitis left 3rd toe.  The various methods of treatment have been discussed with the patient and family. After consideration of risks, benefits and other options for treatment, the patient has consented to  Procedure(s): AMPUTATION, TOE (Left) as a surgical intervention.  The patient's history has been reviewed, patient examined, no change in status, stable for surgery.  I have reviewed the patient's chart and labs.  Questions were answered to the patient's satisfaction.     Thresa CHRISTELLA Sar

## 2024-04-16 NOTE — Brief Op Note (Signed)
 04/16/2024  11:07 AM  PATIENT:  Denise Webb  58 y.o. female  PRE-OPERATIVE DIAGNOSIS:  Osteomyelitis left 3rd toe.  Possible osteomyelitis fourth metatarsal left  POST-OPERATIVE DIAGNOSIS:  S/P left foot 3rd metatarsal amputation  PROCEDURE:  Procedure(s): AMPUTATION, TOE (Left) Partial third ray amputation left. Bone biopsy fourth metatarsal left  SURGEON:  Surgeons and Role:    DEWAINE Janit Thresa CHRISTELLA, DPM - Primary  PHYSICIAN ASSISTANT: None  ASSISTANTS: None  ANESTHESIA:   general  EBL:  5 mL   BLOOD ADMINISTERED:none  DRAINS: none   LOCAL MEDICATIONS USED:  MARCAINE    , LIDOCAINE  , and Amount: 10 ml  SPECIMEN:  Source of Specimen:  Third metatarsal sent for both culture and pathology.  Fourth metatarsal specimen sent for culture.  Soft tissue culture  DISPOSITION OF SPECIMEN:  PATHOLOGY  COUNTS:  YES  TOURNIQUET:   Total Tourniquet Time Documented: Calf (Left) - 50 minutes Total: Calf (Left) - 50 minutes   DICTATION: .[place] Dictation  PLAN OF CARE: Admit to inpatient   PATIENT DISPOSITION:  PACU - hemodynamically stable.   Delay start of Pharmacological VTE agent (>24hrs) due to surgical blood loss or risk of bleeding: no  Thresa EMERSON Janit, DPM [facility]  Dr. Thresa EMERSON Janit, DPM    [address] [place], [place] [address]                Office [phone]  Fax [phone]

## 2024-04-16 NOTE — Progress Notes (Signed)
 PROGRESS NOTE    Denise Webb   FMW:[id number] DOB: 09-Dec-1965  DOA: 04/14/2024 Date of Service: 04/16/24 which is hospital day 2  PCP: [facility]    Hospital course / significant events:   HPI: Denise Webb is a 58 y.o. female with medical history significant of insulin -dependent diabetes mellitus diabetic peripheral neuropathy complicated by chronic diabetic ulcers, hypertension, hyperlipidemia, chronic pain disorder depression migraine headaches and irritable bowel syndrome.  Patient presented to the emergency room 08/21 evening accompanied by husband.  Chief complaints of left lower extremity swelling and erythema and pain.  She has hide nonhealing ulcers at the base of the foot.  Recently noted blistering of toes and noticed acute swelling of lower extremity with surrounding erythema  08/21: to ED. Started abx. DVT r/o. MRI pending.  08/22: admitted to hospitalist AM. MRI (+)osteo. Podiatry to see.  08/23: underwent partial third ray amputation in combination with bone biopsy of the fourth metatarsal planned for tomorrow      Consultants:  Podiatry  Procedures/Surgeries: 04/16/24 Partial third ray amputation left, Bone biopsy fourth metatarsal left      ASSESSMENT & PLAN:   #Diabetic foot ulcers  #surrounding left lower extremity cellulitis #Osteomyelitis  S/p Partial third ray amputation left and Bone biopsy fourth metatarsal left w/ Dr Janit today  Dressing change tomorrow w/ podiatry and if stable can dc home  WBAT, postop shoe, pain control    #Severe hypokalemia resolved  Potassium replacement protocol   #Chronic pain syndrome:  Confirmed w/ PDMP pt is filling Rx for Oxycodone  20 mg #90 x30 days, filling regularly  oxycodone  20 mg tid here  Dilaudid  for breakthrough    #Insulin -dependent diabetes mellitus: Complicated by diabetic neuropathy.   on Ozempic at home.   Insulin  sliding scale   #Hypertension: Resume home  medications as appropriate. Contineu home atenolol  - reduced dose but to avoid rebound tachycardia will not hold altogether unless significantly low BP, consider weaning off altogether     #Anxiety disorder/depression:  Continue wellbutrin     none based on BMI: Body mass index is 23.72 kg/m.SABRA Significantly low or high BMI is associated with higher medical risk.  Underweight - under 18  overweight - 25 to 29 obese - 30 or more Class 1 obesity: BMI of 30.0 to 34 Class 2 obesity: BMI of 35.0 to 39 Class 3 obesity: BMI of 40.0 to 49 Super Morbid Obesity: BMI 50-59 Super-super Morbid Obesity: BMI 60+ Healthy nutrition and physical activity advised as adjunct to other disease management and risk reduction treatments    DVT prophylaxis: lovenox   IV fluids: no continuous IV fluids  Nutrition: carb modified Central lines / other devices: none  Code Status: FULL CODE ACP documentation reviewed: none on file in [facility]  TOC needs: TBD Medical barriers to dispo: wound check in AM and hopefully can go home after that              Subjective / Brief ROS:  Patient reports pain in foot is okay, postop bandages in place  Denies CP/SOB.  Denies new weakness.  Tolerating diet .  Reports no concerns w/ urination/defecation.   Family Communication: family is at bedside on rounds      Objective Findings:  Vitals:   04/16/24 1106 04/16/24 1115 04/16/24 1132 04/16/24 1201  BP: 116/78 114/78 125/85 118/75  Pulse: 99 95 91 91  Resp: 16 20 19 17   Temp: (!) 97.4 F (36.3 C)  (!) 97.4 F (36.3 C) 97.9  F (36.6 C)  TempSrc:    Oral  SpO2: 97% 95% 96% 96%  Weight:      Height:        Intake/Output Summary (Last 24 hours) at 04/16/2024 1446 Last data filed at 04/16/2024 1300 Gross per 24 hour  Intake 980 ml  Output 5 ml  Net 975 ml   Filed Weights   04/13/24 2058  Weight: 70.8 kg    Examination:  Physical Exam Constitutional:      General: She is not in acute  distress. Pulmonary:     Effort: Pulmonary effort is normal.  Neurological:     Mental Status: She is alert and oriented to person, place, and time.  Psychiatric:        Mood and Affect: Mood normal.        Behavior: Behavior normal.          Scheduled Medications:   acidophilus  1 capsule Oral Daily   atenolol   12.5 mg Oral Daily   atorvastatin   40 mg Oral Daily   buPROPion   100 mg Oral BID   calcium  acetate  1,334 mg Oral TID WC   docusate sodium   100 mg Oral BID   enoxaparin  (LOVENOX ) injection  40 mg Subcutaneous Q24H   insulin  aspart  0-15 Units Subcutaneous TID WC   oxyCODONE   20 mg Oral TID   pantoprazole   40 mg Oral Daily   sodium chloride  flush  3 mL Intravenous Q12H   traZODone   100 mg Oral QHS    Continuous Infusions:  ampicillin -sulbactam (UNASYN ) IV 3 g (04/16/24 1240)   vancomycin  750 mg (04/16/24 0614)    PRN Medications:  albuterol , ALPRAZolam , aspirin -acetaminophen -caffeine , cyclobenzaprine , HYDROmorphone  (DILAUDID ) injection  Antimicrobials from admission:  Anti-infectives (From admission, onward)    Start     Dose/Rate Route Frequency Ordered Stop   04/14/24 1800  vancomycin  (VANCOREADY) IVPB 750 mg/150 mL       Note to Pharmacy: Pharmacy to dose   750 mg 150 mL/hr over 60 Minutes Intravenous Every 12 hours 04/14/24 0536     04/14/24 1200  Ampicillin -Sulbactam (UNASYN ) 3 g in sodium chloride  0.9 % 100 mL IVPB        3 g 200 mL/hr over 30 Minutes Intravenous Every 6 hours 04/14/24 0525     04/14/24 0530  vancomycin  (VANCOCIN ) IVPB 1000 mg/200 mL premix  Status:  Discontinued       Note to Pharmacy: Pharmacy to dose   1,000 mg 200 mL/hr over 60 Minutes Intravenous Every 24 hours 04/14/24 0525 04/14/24 0536   04/14/24 0445  vancomycin  (VANCOREADY) IVPB 1750 mg/350 mL        1,750 mg 175 mL/hr over 120 Minutes Intravenous  Once 04/14/24 0436 04/14/24 0927   04/14/24 0430  Ampicillin -Sulbactam (UNASYN ) 3 g in sodium chloride  0.9 % 100 mL IVPB        Placed in And Linked Group   3 g 200 mL/hr over 30 Minutes Intravenous  Once 04/14/24 0417 04/14/24 0744   04/14/24 0430  vancomycin  (VANCOCIN ) IVPB 1000 mg/200 mL premix  Status:  Discontinued       Placed in And Linked Group   1,000 mg 200 mL/hr over 60 Minutes Intravenous  Once 04/14/24 0417 04/14/24 0435           Data Reviewed:  I have personally reviewed the following...  CBC: Recent Labs  Lab 04/13/24 2101 04/15/24 0835 04/16/24 0606  WBC 10.5 4.8 4.3  HGB 10.9* 8.8*  9.0*  HCT 34.6* 27.6* 28.4*  MCV 86.9 87.6 87.1  PLT 394 291 321   Basic Metabolic Panel: Recent Labs  Lab 04/13/24 2101 04/15/24 0835 04/16/24 0606  NA 133* 138 141  K 2.7* 3.6 3.5  CL 92* 105 107  CO2 24 28 23   GLUCOSE 183* 104* 85  BUN 12 10 5*  CREATININE 1.00 0.60 0.59  CALCIUM  9.6 8.7* 8.8*  MG 1.7  --   --    GFR: Estimated Creatinine Clearance: 79.2 mL/min (by C-G formula based on SCr of 0.59 mg/dL). Liver Function Tests: No results for input(s): AST, ALT, ALKPHOS, BILITOT, PROT, ALBUMIN in the last 168 hours. No results for input(s): LIPASE, AMYLASE in the last 168 hours. No results for input(s): AMMONIA in the last 168 hours. Coagulation Profile: No results for input(s): INR, PROTIME in the last 168 hours. Cardiac Enzymes: No results for input(s): CKTOTAL, CKMB, CKMBINDEX, TROPONINI in the last 168 hours. BNP (last 3 results) No results for input(s): PROBNP in the last 8760 hours. HbA1C: Recent Labs    04/14/24 0648  HGBA1C 6.6*   CBG: Recent Labs  Lab 04/15/24 1135 04/15/24 1632 04/15/24 2054 04/16/24 1116 04/16/24 1140  GLUCAP 81 103* 108* 78 78   Lipid Profile: No results for input(s): CHOL, HDL, LDLCALC, TRIG, CHOLHDL, LDLDIRECT in the last 72 hours. Thyroid Function Tests: No results for input(s): TSH, T4TOTAL, FREET4, T3FREE, THYROIDAB in the last 72 hours. Anemia Panel: No results for  input(s): VITAMINB12, FOLATE, FERRITIN, TIBC, IRON, RETICCTPCT in the last 72 hours. Most Recent Urinalysis On File:     Component Value Date/Time   COLORURINE STRAW (A) 01/27/2016 2250   APPEARANCEUR CLEAR 01/27/2016 2250   APPEARANCEUR Clear 02/22/2015 1602   LABSPEC 1.008 01/27/2016 2250   PHURINE 6.5 01/27/2016 2250   GLUCOSEU NEGATIVE 01/27/2016 2250   HGBUR NEGATIVE 01/27/2016 2250   BILIRUBINUR NEGATIVE 01/27/2016 2250   BILIRUBINUR Negative 02/22/2015 1602   KETONESUR NEGATIVE 01/27/2016 2250   PROTEINUR NEGATIVE 01/27/2016 2250   UROBILINOGEN 0.2 02/09/2012 1753   NITRITE NEGATIVE 01/27/2016 2250   LEUKOCYTESUR NEGATIVE 01/27/2016 2250   LEUKOCYTESUR Negative 02/22/2015 1602   Sepsis Labs: @LABRCNTIP (procalcitonin:4,lacticidven:4) Microbiology: Recent Results (from the past 240 hours)  Blood Culture (routine x 2)     Status: None (Preliminary result)   Collection Time: 04/14/24  2:53 AM   Specimen: BLOOD  Result Value Ref Range Status   Specimen Description BLOOD UNKNOWN  Final   Special Requests   Final    BOTTLES DRAWN AEROBIC AND ANAEROBIC Blood Culture results may not be optimal due to an excessive volume of blood received in culture bottles   Culture   Final    NO GROWTH 2 DAYS Performed at [facility], [address], [place], [place] [address]    Report Status PENDING  Incomplete  Blood Culture (routine x 2)     Status: None (Preliminary result)   Collection Time: 04/14/24  2:53 AM   Specimen: BLOOD  Result Value Ref Range Status   Specimen Description BLOOD UNKNOWN  Final   Special Requests   Final    BOTTLES DRAWN AEROBIC AND ANAEROBIC Blood Culture results may not be optimal due to an excessive volume of blood received in culture bottles   Culture   Final    NO GROWTH 2 DAYS Performed at [facility], [address] [place], [place] [address]    Report Status PENDING  Incomplete  Aerobic/Anaerobic Culture w Gram  Stain (surgical/deep wound)     Status: None (Preliminary result)   Collection Time: 04/14/24  4:46 AM   Specimen: Wound  Result Value Ref Range Status   Specimen Description   Final    WOUND Performed at [facility], [address], [place], [place] [address]    Special Requests   Final    LEFT FOOT Performed at [facility], [address] [place], [place] [address]    Gram Stain   Final    RARE WBC PRESENT, PREDOMINANTLY PMN RARE GRAM POSITIVE COCCI IN PAIRS    Culture   Final    FEW STAPHYLOCOCCUS EPIDERMIDIS RARE DIPHTHEROIDS(CORYNEBACTERIUM SPECIES) Standardized susceptibility testing for this organism is not available. SUSCEPTIBILITIES TO FOLLOW FOR  STAPHYLOCOCCUS EPIDERMIDIS HOLDING FOR POSSIBLE ANAEROBE Performed at [facility], [address], [place], [place] [address]    Report Status PENDING  Incomplete  Aerobic/Anaerobic Culture w Gram Stain (surgical/deep wound)     Status: None (Preliminary result)   Collection Time: 04/16/24 10:19 AM   Specimen: PATH Digit amputation; Tissue  Result Value Ref Range Status   Specimen Description   Final    BONE Performed at [facility], [address] [place], [place] [address]    Special Requests   Final    3RD LEFT FOOT METATARSAL Performed at [facility], [address], [place], [place] [address]    Gram Stain PENDING  Incomplete   Culture PENDING  Incomplete   Report Status PENDING  Incomplete  Aerobic Culture w Gram Stain (superficial specimen)     Status: None (Preliminary result)   Collection Time: 04/16/24 11:30 AM   Specimen: Other Source; Tissue  Result Value Ref Range Status   Specimen Description   Final    WOUND Performed at [facility], [address], [place], [place] [address]    Special Requests   Final    SWAB Performed at [facility], [address] [place], [place] [address]    Gram Stain   Final    ABUNDANT WBC PRESENT,  PREDOMINANTLY PMN RARE GRAM POSITIVE COCCI Performed at [facility], [address], [place], [place] [address]    Culture PENDING  Incomplete   Report Status PENDING  Incomplete      Radiology Studies last 3 days: US  ARTERIAL ABI (SCREENING LOWER EXTREMITY) Result Date: 04/15/2024 CLINICAL DATA:  [id number] PAD (peripheral artery disease) (HCC) [id number], hypertension, hyperlipidemia, diabetes, rest pain and claudication EXAM: NONINVASIVE PHYSIOLOGIC VASCULAR STUDY OF BILATERAL LOWER EXTREMITIES TECHNIQUE: Evaluation of both lower extremities were performed at rest, including calculation of ankle-brachial indices with single level Doppler, pressure and pulse volume recording. COMPARISON:  None Available. FINDINGS: Right ABI:  1.18 Left ABI:  1.23 Right Lower Extremity:  Multiphasic arterial waveforms at the ankle. Left Lower Extremity:  Multiphasic arterial waveforms at the ankle. IMPRESSION: Normal bilateral ABIs. Electronically Signed   By: JONETTA Faes M.D.   On: 04/15/2024 09:38   MRI Left foot without contrast Result Date: 04/14/2024 CLINICAL DATA:  Open wounds along the digits in ball of the foot. Erythema and swelling EXAM: MRI OF THE LEFT FOOT WITHOUT CONTRAST TECHNIQUE: Multiplanar, multisequence MR imaging of the left foot from the midfoot through the toes was performed. No intravenous contrast was administered. COMPARISON:  Radiographs 04/14/2024 FINDINGS: Bones/Joint/Cartilage Dorsal dislocation of the proximal phalanx third toe with respect to the metatarsal head. Marrow edema in the third metatarsal head as well as  the proximal and middle phalanges of the third toe suspicious for osteomyelitis particularly given the trace amount of gas just dorsal to the third metatarsal head on image 19 series 10. Possible draining sinus tract extending from the joint to the plantar surface on image 20 series 10. Mild nonspecific marrow edema in the head of the fourth metatarsal and base of the fourth  proximal phalanx for example on image 11 series 9, potentially reactive or secondary to early osteomyelitis. Degenerative arthropathy between the head of the first metatarsal and the medial sesamoid associated low-grade marrow edema in both structures. Chronic appearing medial erosion of the head of the first metatarsal possibly from remote gout. Mild hallux valgus. Erosive arthropathy at the interphalangeal joint of the great toe. Ligaments Lisfranc ligament intact. Muscles and Tendons Moderate regional muscular atrophy. Soft tissues Dorsal subcutaneous edema in the forefoot. Plantar ulceration below the medial sesamoid of the first digit. Suspected draining sinus tract extending from the third MCP joint to the plantar surface. Subcutaneous edema extends into the second through fifth toes and may reflect cellulitis. IMPRESSION: 1. Dorsal dislocation of the proximal phalanx third toe with respect to the metatarsal head. Marrow edema in the third metatarsal head as well as the proximal and middle phalanges of the third toe suspicious for osteomyelitis particularly given the trace amount of gas just dorsal to the third metatarsal head and suspected draining sinus tract extending from the third MCP joint to the plantar surface. 2. Mild nonspecific marrow edema in the head of the fourth metatarsal and base of the fourth proximal phalanx, potentially reactive or secondary to early osteomyelitis. 3. Plantar ulceration below the medial sesamoid of the first digit. 4. Dorsal subcutaneous edema in the forefoot extending into the second through fifth toes, potentially cellulitis. 5. Degenerative arthropathy between the head of the first metatarsal and the medial sesamoid. 6. Chronic appearing medial erosion of the head of the first metatarsal possibly from remote gout. 7. Erosive arthropathy at the interphalangeal joint of the great toe. Electronically Signed   By: Ryan Salvage M.D.   On: 04/14/2024 08:52   DG Foot  Complete Left Result Date: 04/14/2024 CLINICAL DATA:  Diabetic foot wounds.  Evaluate for osteomyelitis. EXAM: LEFT FOOT - COMPLETE 3+ VIEW COMPARISON:  None Available. FINDINGS: No acute bony abnormality. Specifically, no fracture, subluxation, or dislocation. No bone destruction to suggest osteomyelitis. IMPRESSION: No acute bony abnormality. Electronically Signed   By: Franky Crease M.D.   On: 04/14/2024 02:52   US  Venous Img Lower Unilateral Left Result Date: 04/13/2024 CLINICAL DATA:  Left lower extremity swelling. EXAM: LEFT LOWER EXTREMITY VENOUS DOPPLER ULTRASOUND TECHNIQUE: Gray-scale sonography with compression, as well as color and duplex ultrasound, were performed to evaluate the deep venous system(s) from the level of the common femoral vein through the popliteal and proximal calf veins. COMPARISON:  None Available. FINDINGS: VENOUS Normal compressibility of the common femoral, superficial femoral, and popliteal veins, as well as the visualized calf veins. Visualized portions of profunda femoral vein and great saphenous vein unremarkable. No filling defects to suggest DVT on grayscale or color Doppler imaging. Doppler waveforms show normal direction of venous flow, normal respiratory plasticity and response to augmentation. Limited views of the contralateral common femoral vein are unremarkable. OTHER Prominent left inguinal lymph node, but this maintains a normal central fatty hilum. Limitations: none IMPRESSION: No evidence of left lower extremity DVT. Electronically Signed   By: Franky Crease M.D.   On: 04/13/2024 23:00  Time spent: 25 min    Eliyohu Class, DO [facility] 04/16/2024, 2:46 PM    Dictation software may have been used to generate the above note. Typos may occur and escape review in typed/dictated notes. Please contact Dr Marsa directly for clarity if needed.  Staff may message me via secure chat in Epic  but this may not receive an immediate  response,  please page me for urgent matters!  If 7PM-7AM, please contact night coverage [email or web address]

## 2024-04-16 NOTE — Transfer of Care (Signed)
 Immediate Anesthesia Transfer of Care Note  Patient: Denise Webb  Procedure(s) Performed: AMPUTATION, TOE (Left: Toe)  Patient Location: PACU  Anesthesia Type:General  Level of Consciousness: awake, alert , and oriented  Airway & Oxygen Therapy: Patient Spontanous Breathing  Post-op Assessment: Report given to RN, Post -op Vital signs reviewed and stable, and Patient moving all extremities  Post vital signs: Reviewed and stable  Last Vitals:  Vitals Value Taken Time  BP 116/78 04/16/24 11:06  Temp 36.3 C 04/16/24 11:06  Pulse 93 04/16/24 11:11  Resp 16 04/16/24 11:06  SpO2 96 % 04/16/24 11:11  Vitals shown include unfiled device data.  Last Pain:  Vitals:   04/16/24 0834  TempSrc: Oral  PainSc:       Patients Stated Pain Goal: 0 (04/15/24 0736)  Complications: No notable events documented.

## 2024-04-16 NOTE — Plan of Care (Incomplete)

## 2024-04-16 NOTE — Consult Note (Signed)
 WOC consulted for left foot wounds, podiatry is managing wound care for this patient currently. Will not consult for that reason.   Re consult if needed, will not follow at this time. Thanks  [employer], RN,CWOCN, CNS, [employer] [phone]

## 2024-04-17 ENCOUNTER — Other Ambulatory Visit: Payer: Self-pay

## 2024-04-17 ENCOUNTER — Encounter: Payer: Self-pay | Admitting: Podiatry

## 2024-04-17 DIAGNOSIS — L03116 Cellulitis of left lower limb: Secondary | ICD-10-CM | POA: Diagnosis not present

## 2024-04-17 LAB — AEROBIC/ANAEROBIC CULTURE W GRAM STAIN (SURGICAL/DEEP WOUND)

## 2024-04-17 LAB — GLUCOSE, CAPILLARY
Glucose-Capillary: 83 mg/dL (ref 70–99)
Glucose-Capillary: 87 mg/dL (ref 70–99)
Glucose-Capillary: 90 mg/dL (ref 70–99)

## 2024-04-17 MED ORDER — DOXYCYCLINE HYCLATE 100 MG PO TABS
100.0000 mg | ORAL_TABLET | Freq: Two times a day (BID) | ORAL | 0 refills | Status: AC
  Filled 2024-04-17: qty 11, 6d supply, fill #0

## 2024-04-17 MED ORDER — AMOXICILLIN-POT CLAVULANATE 875-125 MG PO TABS
1.0000 | ORAL_TABLET | Freq: Two times a day (BID) | ORAL | 0 refills | Status: AC
Start: 2024-04-17 — End: 2024-04-23
  Filled 2024-04-17: qty 11, 6d supply, fill #0

## 2024-04-17 MED ORDER — ATENOLOL 25 MG PO TABS: 12.5000 mg | ORAL_TABLET | Freq: Every day | ORAL | Status: AC

## 2024-04-17 NOTE — Progress Notes (Signed)
  Subjective:  Patient ID: Denise Webb, female    DOB: [birth year],  MRN: [id number]  Chief Complaint  Patient presents with   Foot Pain    DOS: 04/16/24 Procedure: 1.  Partial third ray amputation left 2.  Bone biopsy fourth metatarsal left  58 y.o. female seen for post op check. Patient doing well, she is having minimal pain today, we discussed findings from surgery and follow up plans. She is hopeful for DC later today.   Review of Systems: Negative except as noted in the HPI. Denies N/V/F/Ch.   Objective:   Constitutional Well developed. Well nourished.  Vascular Foot warm and well perfused. Capillary refill normal to all digits.   No calf pain with palpation  Neurologic Normal speech. Oriented to person, place, and time. Epicritic sensation diminished   Dermatologic Incision is well coapted with no drainage, no erythema or macertaion, very healthy ray amputation site at this time. Plantar forefoot ulcers also appear much improved with no drainage and helathy wound bed      Orthopedic: S/p left partial third ray amputation, 4th metatarsal bone biopsy, mild edema present   Radiographs: 8/24: 1. Interval transmetatarsal amputation of the third ray with expected postoperative changes in the overlying soft tissues. 2. Lucencies projecting over the fourth metatarsal, possibly overlapping air in the soft tissues or postsurgical, not seen on preoperative exam.  Pathology: pending  Micro: rare GPCs pending  Assessment:   1. Diabetic infection of left foot (HCC)   2. Cellulitis of left lower extremity   3. 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  POD # 1 s/p partial 3rd ray amputation and left fourth met bone biopsy  -Progressing well post op, pain controlled and clinically appear to have good control of infection. Incision well coapted  ano dehiscence or evidence of residual infection  -XR: expected post op changes -WB Status: Partial weight bearing in postop shoe, heel pressure mostly, 50% body weight using crutches or walker  -Sutures: remain intact. -Medications/ABX: recommend 5 days doxycyline and augmentin  on discharge, will follow as outpatient in addition to fourth metatarsal bone biopsy - if positive decision likely for prolonged duration abx for treatment  -Dressing: changed today, remain intact until follow up - F/u Plan: Follow up with Dr Janit in [place] location, likely this Friday. Stressed importance of minimizing wb to avoid dehiscence. Stable for dc home today.         Marolyn JULIANNA Honour, DPM [facility] / [facility]

## 2024-04-17 NOTE — Progress Notes (Signed)
 Patient to discharge home. PIV removed. Meds delivered to patient and education provided. Follow up care instructions provided to patient. Questions answered. No concerns voiced.

## 2024-04-17 NOTE — Plan of Care (Signed)

## 2024-04-17 NOTE — Discharge Summary (Signed)
 Physician Discharge Summary   Patient: Denise Webb MRN: [id number]  DOB: [birth year]   Admit:     Date of Admission: 04/14/2024 Admitted from: home   Discharge: Date of discharge: 04/17/24 Disposition: Home Condition at discharge: good  CODE STATUS: FULL CODE     Discharge Physician: Laneta Blunt, DO [facility]     PCP: Roni, [facility]  Recommendations for Outpatient Follow-up:  Follow up with PCP Pllc, [facility] in 2-4 weeks Follow up as directed w/ podiatry Dr Lennie for postop check     Discharge Instructions     Diet - low sodium heart healthy   Complete by: As directed    Discharge wound care:   Complete by: As directed    Keep wound clean, covered, dry. OK for showering but do NOT soak. Follow as directed w/ podiatry   Increase activity slowly   Complete by: As directed          Discharge Diagnoses: Principal Problem:   Cellulitis of left lower extremity       Hospital Course: Hospital course / significant events:   HPI: Denise Webb is a 58 y.o. female with medical history significant of insulin -dependent diabetes mellitus diabetic peripheral neuropathy complicated by chronic diabetic ulcers, hypertension, hyperlipidemia, chronic pain disorder depression migraine headaches and irritable bowel syndrome.  Patient presented to the emergency room 08/21 evening accompanied by husband.  Chief complaints of left lower extremity swelling and erythema and pain.  She has hide nonhealing ulcers at the base of the foot.  Recently noted blistering of toes and noticed acute swelling of lower extremity with surrounding erythema  08/21: to ED. Started abx. DVT r/o. MRI pending.  08/22: admitted to hospitalist AM. MRI (+)osteo. Podiatry to see.  08/23: pending to OR 08/24: underwent partial third ray amputation in combination with bone biopsy of the fourth metatarsal planned for tomorrow  08/25: postop  doing well, stable for dc home      Consultants:  Podiatry  Procedures/Surgeries: 04/16/24 Partial third ray amputation left, Bone biopsy fourth metatarsal left      ASSESSMENT & PLAN:   #Diabetic foot ulcers  #surrounding left lower extremity cellulitis #Osteomyelitis  S/p Partial third ray amputation left and Bone biopsy fourth metatarsal left w/ Dr Janit 04/16/24  WBAT, postop shoe, pain control  5 more days augmentin  + doxy    #Severe hypokalemia resolved  Potassium replacement and follow outpatient   #Chronic pain syndrome:  Confirmed w/ PDMP pt is filling Rx for Oxycodone  20 mg #90 x30 days, filling regularly  Continue home oxycodone    #Insulin -dependent diabetes mellitus: Complicated by diabetic neuropathy.   Resume home meds   #Hypertension: Resume home medications as appropriate. Contineu home atenolol  - reduced dose but to avoid rebound tachycardia will not hold altogether unless significantly low BP, consider weaning off altogether     #Anxiety disorder/depression:  Continue wellbutrin     none based on BMI: Body mass index is 23.72 kg/m.SABRA Significantly low or high BMI is associated with higher medical risk.  Underweight - under 18  overweight - 25 to 29 obese - 30 or more Class 1 obesity: BMI of 30.0 to 34 Class 2 obesity: BMI of 35.0 to 39 Class 3 obesity: BMI of 40.0 to 49 Super Morbid Obesity: BMI 50-59 Super-super Morbid Obesity: BMI 60+ Healthy nutrition and physical activity advised as adjunct to other disease management and risk reduction treatments  Discharge Instructions  Allergies as of 04/17/2024       Reactions   Elemental Sulfur    Erythromycin Stearate    REACTION: causes anxiety, heart to race   Morphine And Codeine Nausea And Vomiting        Medication List     STOP taking these medications    enoxaparin  40 MG/0.4ML injection Commonly known as: LOVENOX        TAKE these medications     Acidophilus Probiotic 100 MG Caps Take 1 capsule (100 mg total) by mouth daily.   ALPRAZolam  1 MG tablet Commonly known as: XANAX  Take one tablet by mouth three times daily as needed for anxiety   amoxicillin -clavulanate 875-125 MG tablet Commonly known as: AUGMENTIN  Take 1 tablet by mouth 2 (two) times daily for 11 doses. Start with one dose evening 04/17/24 and continue twice daily starting 04/18/24   aspirin -acetaminophen -caffeine  250-250-65 MG tablet Commonly known as: EXCEDRIN  MIGRAINE Take by mouth every 6 (six) hours as needed for headache.   atenolol  25 MG tablet Commonly known as: TENORMIN  Take 0.5 tablets (12.5 mg total) by mouth daily. What changed: how much to take   atorvastatin  40 MG tablet Commonly known as: LIPITOR Take 40 mg by mouth at bedtime.   atorvastatin  10 MG tablet Commonly known as: LIPITOR TAKE 1 TABLET (10 MG TOTAL) BY MOUTH DAILY.   buPROPion  100 MG tablet Commonly known as: WELLBUTRIN  Take 100 mg by mouth 2 (two) times daily.   calcium  acetate 667 MG capsule Commonly known as: PHOSLO  Take 1,334 mg by mouth 3 (three) times daily.   citalopram  40 MG tablet Commonly known as: CELEXA  Take 1 tablet (40 mg total) by mouth daily.   cyclobenzaprine  10 MG tablet Commonly known as: FLEXERIL  Take 10 mg by mouth at bedtime as needed.   docusate sodium  100 MG capsule Commonly known as: COLACE Take 1 capsule (100 mg total) by mouth 2 (two) times daily.   doxycycline  100 MG tablet Commonly known as: VIBRA -TABS Take 1 tablet (100 mg total) by mouth 2 (two) times daily for 11 doses. Start with one dose evening 04/17/24 and continue twice daily starting 04/18/24   Farxiga 10 MG Tabs tablet Generic drug: dapagliflozin propanediol Take 10 mg by mouth daily.   glucose blood test strip 1 each by Other route as needed for other. Reli on prime: check blood sugar twice daily DX: 250.62   hydrochlorothiazide  25 MG tablet Commonly known as:  HYDRODIURIL  TAKE 1 TABLET (25 MG TOTAL) BY MOUTH DAILY.   loperamide  2 MG tablet Commonly known as: IMODIUM  A-D Take 2 mg by mouth 3 (three) times daily as needed. Take 2 tabs by mouth once daily as needed for diarrhea.   metFORMIN  1000 MG tablet Commonly known as: GLUCOPHAGE  Take 1,000 mg by mouth daily.   methocarbamol  500 MG tablet Commonly known as: ROBAXIN  Take 1 tablet (500 mg total) by mouth every 6 (six) hours as needed for muscle spasms.   mupirocin  ointment 2 % Commonly known as: BACTROBAN  Apply 1 application topically 2 (two) times daily.   naproxen  500 MG tablet Commonly known as: NAPROSYN  Take 1 tablet (500 mg total) by mouth 2 (two) times daily with a meal.   neomycin -bacitracin -polymyxin [phone] ointment Apply daily to sores on feet   omeprazole  40 MG capsule Commonly known as: PRILOSEC Take 1 capsule (40 mg total) by mouth daily.   ondansetron  8 MG tablet Commonly known as: ZOFRAN  Take 8 mg by mouth 2 (two) times  daily as needed.   Oxycodone  HCl 20 MG Tabs Take 1 tablet by mouth every 8 (eight) hours as needed.   Ozempic (1 MG/DOSE) 4 MG/3ML Sopn Generic drug: Semaglutide (1 MG/DOSE) Inject 1 mg into the skin once a week.   potassium chloride  SA 20 MEQ tablet Commonly known as: KLOR-CON  M Take 20 mEq by mouth 2 (two) times daily.   silver sulfADIAZINE 1 % cream Commonly known as: SILVADENE Apply 1 Application topically daily.   traZODone  100 MG tablet Commonly known as: DESYREL  Take 100 mg by mouth at bedtime.   triamcinolone  55 MCG/ACT Aero nasal inhaler Commonly known as: Nasacort  AQ 2 sprays in each nostril daily   Vitamin D  (Ergocalciferol ) 1.25 MG (50000 UNIT) Caps capsule Commonly known as: DRISDOL  Take 2 capsules by mouth once a week.               Durable Medical Equipment  (From admission, onward)           Start     Ordered   04/17/24 1314  For home use only DME Walker rolling  Once       Question Answer  Comment  Walker: With 5 Inch Wheels   Patient needs a walker to treat with the following condition Osteomyelitis (HCC)      04/17/24 1314              Discharge Care Instructions  (From admission, onward)           Start     Ordered   04/17/24 0000  Discharge wound care:       Comments: Keep wound clean, covered, dry. OK for showering but do NOT soak. Follow as directed w/ podiatry   04/17/24 1539              Allergies  Allergen Reactions   Elemental Sulfur    Erythromycin Stearate     REACTION: causes anxiety, heart to race   Morphine And Codeine Nausea And Vomiting     Subjective: pt feeling better this morning, does have headache but this is common for her. No fever/chills. Postop pain controlled   Discharge Exam: BP (!) 139/90 (BP Location: Left Arm)   Pulse 94   Temp 98.1 F (36.7 C) (Oral)   Resp 16   Ht 5' 8 (1.727 m)   Wt 70.8 kg   LMP 12/14/2011   SpO2 97%   BMI 23.72 kg/m  General: Pt is alert, awake, not in acute distress Cardiovascular: RRR, S1/S2 +, no rubs, no gallops Respiratory: CTA bilaterally, no wheezing, no rhonchi Abdominal: Soft, NT, ND, bowel sounds + Extremities:   Post-op 04/17/24       Pre-Op       The results of significant diagnostics from this hospitalization (including imaging, microbiology, ancillary and laboratory) are listed below for reference.     Microbiology: Recent Results (from the past 240 hours)  Blood Culture (routine x 2)     Status: None (Preliminary result)   Collection Time: 04/14/24  2:53 AM   Specimen: BLOOD  Result Value Ref Range Status   Specimen Description BLOOD UNKNOWN  Final   Special Requests   Final    BOTTLES DRAWN AEROBIC AND ANAEROBIC Blood Culture results may not be optimal due to an excessive volume of blood received in culture bottles   Culture   Final    NO GROWTH 3 DAYS Performed at [facility], [address] [place], [place] [address]  Report  Status PENDING  Incomplete  Blood Culture (routine x 2)     Status: None (Preliminary result)   Collection Time: 04/14/24  2:53 AM   Specimen: BLOOD  Result Value Ref Range Status   Specimen Description BLOOD UNKNOWN  Final   Special Requests   Final    BOTTLES DRAWN AEROBIC AND ANAEROBIC Blood Culture results may not be optimal due to an excessive volume of blood received in culture bottles   Culture   Final    NO GROWTH 3 DAYS Performed at [facility], [address] [place], [place] [address]    Report Status PENDING  Incomplete  Aerobic/Anaerobic Culture w Gram Stain (surgical/deep wound)     Status: None   Collection Time: 04/14/24  4:46 AM   Specimen: Wound  Result Value Ref Range Status   Specimen Description   Final    WOUND Performed at [facility], [address] [place], [place] [address]    Special Requests   Final    LEFT FOOT Performed at [facility], [address] [place], [place] [address]    Gram Stain   Final    RARE WBC PRESENT, PREDOMINANTLY PMN RARE GRAM POSITIVE COCCI IN PAIRS    Culture   Final    FEW STAPHYLOCOCCUS EPIDERMIDIS RARE DIPHTHEROIDS(CORYNEBACTERIUM SPECIES) Standardized susceptibility testing for this organism is not available. FEW BACTEROIDES SPECIES BETA LACTAMASE NEGATIVE BACTEROIDES PYOGENES Performed at [facility], [address], [place], [place] [address]    Report Status 04/17/2024 FINAL  Final   Organism ID, Bacteria STAPHYLOCOCCUS EPIDERMIDIS  Final      Susceptibility   Staphylococcus epidermidis - MIC*    CIPROFLOXACIN <=0.5 SENSITIVE Sensitive     ERYTHROMYCIN >=8 RESISTANT Resistant     GENTAMICIN <=0.5 SENSITIVE Sensitive     OXACILLIN >=4 RESISTANT Resistant     TETRACYCLINE 2 SENSITIVE Sensitive     VANCOMYCIN  1 SENSITIVE Sensitive     TRIMETH/SULFA 160 RESISTANT Resistant     CLINDAMYCIN >=8 RESISTANT Resistant     RIFAMPIN <=0.5 SENSITIVE Sensitive     Inducible  Clindamycin NEGATIVE Sensitive     * FEW STAPHYLOCOCCUS EPIDERMIDIS  Aerobic/Anaerobic Culture w Gram Stain (surgical/deep wound)     Status: None (Preliminary result)   Collection Time: 04/16/24 10:19 AM   Specimen: PATH Digit amputation; Tissue  Result Value Ref Range Status   Specimen Description   Final    BONE Performed at [facility], [address] [place], [place] [address]    Special Requests 3RD LEFT FOOT METATARSAL  Final   Gram Stain FEW WBC SEEN NO ORGANISMS SEEN   Final   Culture   Final    NO GROWTH < 24 HOURS Performed at [facility], [address], [place], [place] [address]    Report Status PENDING  Incomplete  Aerobic/Anaerobic Culture w Gram Stain (surgical/deep wound)     Status: None (Preliminary result)   Collection Time: 04/16/24 10:26 AM   Specimen: Wound; Tissue  Result Value Ref Range Status   Specimen Description   Final    WOUND Performed at [facility], [address] [place], [place] [address]    Special Requests   Final    LEFT FOOT 4TH METATARSAL Performed at [facility], [address] [place], [place] [address]    Gram Stain RARE WBC SEEN NO ORGANISMS SEEN   Final   Culture   Final  NO GROWTH < 24 HOURS Performed at [facility], [address], [place], [place] [address]    Report Status PENDING  Incomplete  Aerobic Culture w Gram Stain (superficial specimen)     Status: None (Preliminary result)   Collection Time: 04/16/24 11:30 AM   Specimen: Other Source; Tissue  Result Value Ref Range Status   Specimen Description   Final    WOUND Performed at [facility], [address] [place], [place] [address]    Special Requests   Final    SWAB Performed at [facility], [address] [place], [place] [address]    Gram Stain   Final    ABUNDANT WBC PRESENT, PREDOMINANTLY PMN RARE GRAM POSITIVE COCCI    Culture   Final    NO GROWTH < 24 HOURS Performed at  [facility], [address], [place], [place] [address]    Report Status PENDING  Incomplete     Labs: BNP (last 3 results) No results for input(s): BNP in the last 8760 hours. Basic Metabolic Panel: Recent Labs  Lab 04/13/24 2101 04/15/24 0835 04/16/24 0606  NA 133* 138 141  K 2.7* 3.6 3.5  CL 92* 105 107  CO2 24 28 23   GLUCOSE 183* 104* 85  BUN 12 10 5*  CREATININE 1.00 0.60 0.59  CALCIUM  9.6 8.7* 8.8*  MG 1.7  --   --    Liver Function Tests: No results for input(s): AST, ALT, ALKPHOS, BILITOT, PROT, ALBUMIN in the last 168 hours. No results for input(s): LIPASE, AMYLASE in the last 168 hours. No results for input(s): AMMONIA in the last 168 hours. CBC: Recent Labs  Lab 04/13/24 2101 04/15/24 0835 04/16/24 0606  WBC 10.5 4.8 4.3  HGB 10.9* 8.8* 9.0*  HCT 34.6* 27.6* 28.4*  MCV 86.9 87.6 87.1  PLT 394 291 321   Cardiac Enzymes: No results for input(s): CKTOTAL, CKMB, CKMBINDEX, TROPONINI in the last 168 hours. BNP: Invalid input(s): POCBNP CBG: Recent Labs  Lab 04/16/24 1140 04/16/24 1628 04/16/24 2021 04/17/24 0807 04/17/24 1149  GLUCAP 78 87 69* 87 90   D-Dimer No results for input(s): DDIMER in the last 72 hours. Hgb A1c No results for input(s): HGBA1C in the last 72 hours. Lipid Profile No results for input(s): CHOL, HDL, LDLCALC, TRIG, CHOLHDL, LDLDIRECT in the last 72 hours. Thyroid function studies No results for input(s): TSH, T4TOTAL, T3FREE, THYROIDAB in the last 72 hours.  Invalid input(s): FREET3 Anemia work up No results for input(s): VITAMINB12, FOLATE, FERRITIN, TIBC, IRON, RETICCTPCT in the last 72 hours. Urinalysis    Component Value Date/Time   COLORURINE STRAW (A) 01/27/2016 2250   APPEARANCEUR CLEAR 01/27/2016 2250   APPEARANCEUR Clear 02/22/2015 1602   LABSPEC 1.008 01/27/2016 2250   PHURINE 6.5 01/27/2016 2250   GLUCOSEU NEGATIVE 01/27/2016 2250    HGBUR NEGATIVE 01/27/2016 2250   BILIRUBINUR NEGATIVE 01/27/2016 2250   BILIRUBINUR Negative 02/22/2015 1602   KETONESUR NEGATIVE 01/27/2016 2250   PROTEINUR NEGATIVE 01/27/2016 2250   UROBILINOGEN 0.2 02/09/2012 1753   NITRITE NEGATIVE 01/27/2016 2250   LEUKOCYTESUR NEGATIVE 01/27/2016 2250   LEUKOCYTESUR Negative 02/22/2015 1602   Sepsis Labs Recent Labs  Lab 04/13/24 2101 04/15/24 0835 04/16/24 0606  WBC 10.5 4.8 4.3   Microbiology Recent Results (from the past 240 hours)  Blood Culture (routine x 2)     Status: None (Preliminary result)   Collection Time: 04/14/24  2:53 AM   Specimen: BLOOD  Result Value Ref Range Status   Specimen Description BLOOD UNKNOWN  Final   Special Requests   Final    BOTTLES DRAWN AEROBIC AND ANAEROBIC Blood Culture results may not be optimal due to an excessive volume of blood received in culture bottles   Culture   Final    NO GROWTH 3 DAYS Performed at [facility], [address] [place], [place] [address]    Report Status PENDING  Incomplete  Blood Culture (routine x 2)     Status: None (Preliminary result)   Collection Time: 04/14/24  2:53 AM   Specimen: BLOOD  Result Value Ref Range Status   Specimen Description BLOOD UNKNOWN  Final   Special Requests   Final    BOTTLES DRAWN AEROBIC AND ANAEROBIC Blood Culture results may not be optimal due to an excessive volume of blood received in culture bottles   Culture   Final    NO GROWTH 3 DAYS Performed at [facility], [address] [place], [place] [address]    Report Status PENDING  Incomplete  Aerobic/Anaerobic Culture w Gram Stain (surgical/deep wound)     Status: None   Collection Time: 04/14/24  4:46 AM   Specimen: Wound  Result Value Ref Range Status   Specimen Description   Final    WOUND Performed at [facility], [address] [place], [place] [address]    Special Requests   Final    LEFT FOOT Performed at [facility], [address] [place], [place] [address]    Gram Stain   Final    RARE WBC PRESENT, PREDOMINANTLY PMN RARE GRAM POSITIVE COCCI IN PAIRS    Culture   Final    FEW STAPHYLOCOCCUS EPIDERMIDIS RARE DIPHTHEROIDS(CORYNEBACTERIUM SPECIES) Standardized susceptibility testing for this organism is not available. FEW BACTEROIDES SPECIES BETA LACTAMASE NEGATIVE BACTEROIDES PYOGENES Performed at [facility], [address], [place], [place] [address]    Report Status 04/17/2024 FINAL  Final   Organism ID, Bacteria STAPHYLOCOCCUS EPIDERMIDIS  Final      Susceptibility   Staphylococcus epidermidis - MIC*    CIPROFLOXACIN <=0.5 SENSITIVE Sensitive     ERYTHROMYCIN >=8 RESISTANT Resistant     GENTAMICIN <=0.5 SENSITIVE Sensitive     OXACILLIN >=4 RESISTANT Resistant     TETRACYCLINE 2 SENSITIVE Sensitive     VANCOMYCIN  1 SENSITIVE Sensitive     TRIMETH/SULFA 160 RESISTANT Resistant     CLINDAMYCIN >=8 RESISTANT Resistant     RIFAMPIN <=0.5 SENSITIVE Sensitive     Inducible Clindamycin NEGATIVE Sensitive     * FEW STAPHYLOCOCCUS EPIDERMIDIS  Aerobic/Anaerobic Culture w Gram Stain (surgical/deep wound)     Status: None (Preliminary result)   Collection Time: 04/16/24 10:19 AM   Specimen: PATH Digit amputation; Tissue  Result Value Ref Range Status   Specimen Description   Final    BONE Performed at [facility], [address] [place], [place] [address]    Special Requests 3RD LEFT FOOT METATARSAL  Final   Gram Stain FEW WBC SEEN NO ORGANISMS SEEN   Final   Culture   Final    NO GROWTH < 24 HOURS Performed at [facility], [address] [place], [place] [address]    Report Status PENDING  Incomplete  Aerobic/Anaerobic Culture w Gram Stain (surgical/deep wound)     Status: None (Preliminary result)   Collection Time: 04/16/24 10:26 AM   Specimen: Wound; Tissue  Result Value  Ref Range Status   Specimen Description   Final    WOUND Performed at [facility], [address], [place], [place] [address]    Special Requests   Final    LEFT FOOT 4TH METATARSAL Performed at [facility], [address] [place], [place] [address]    Gram Stain RARE WBC SEEN NO ORGANISMS SEEN   Final   Culture   Final    NO GROWTH < 24 HOURS Performed at [facility], [address] [place], [place] [address]    Report Status PENDING  Incomplete  Aerobic Culture w Gram Stain (superficial specimen)     Status: None (Preliminary result)   Collection Time: 04/16/24 11:30 AM   Specimen: Other Source; Tissue  Result Value Ref Range Status   Specimen Description   Final    WOUND Performed at [facility], [address] [place] [place], [place] [address]    Special Requests   Final    SWAB Performed at [facility], [address] [place], [place] [address]    Gram Stain   Final    ABUNDANT WBC PRESENT, PREDOMINANTLY PMN RARE GRAM POSITIVE COCCI    Culture   Final    NO GROWTH < 24 HOURS Performed at [facility], [address], [place], [place] [address]    Report Status PENDING  Incomplete   Imaging US  ARTERIAL ABI (SCREENING LOWER EXTREMITY) Result Date: 04/15/2024 CLINICAL DATA:  [id number] PAD (peripheral artery disease) (HCC) [phone], hypertension, hyperlipidemia, diabetes, rest pain and claudication EXAM: NONINVASIVE PHYSIOLOGIC VASCULAR STUDY OF BILATERAL LOWER EXTREMITIES TECHNIQUE: Evaluation of both lower extremities were performed at rest, including calculation of ankle-brachial indices with single level Doppler, pressure and pulse volume recording. COMPARISON:  None Available. FINDINGS: Right ABI:  1.18 Left ABI:  1.23 Right Lower Extremity:  Multiphasic arterial waveforms at the ankle. Left Lower Extremity:  Multiphasic arterial waveforms at the ankle. IMPRESSION: Normal bilateral ABIs. Electronically Signed   By: JONETTA Faes M.D.   On: 04/15/2024 09:38   MRI Left foot without contrast Result Date:  04/14/2024 CLINICAL DATA:  Open wounds along the digits in ball of the foot. Erythema and swelling EXAM: MRI OF THE LEFT FOOT WITHOUT CONTRAST TECHNIQUE: Multiplanar, multisequence MR imaging of the left foot from the midfoot through the toes was performed. No intravenous contrast was administered. COMPARISON:  Radiographs 04/14/2024 FINDINGS: Bones/Joint/Cartilage Dorsal dislocation of the proximal phalanx third toe with respect to the metatarsal head. Marrow edema in the third metatarsal head as well as the proximal and middle phalanges of the third toe suspicious for osteomyelitis particularly given the trace amount of gas just dorsal to the third metatarsal head on image 19 series 10. Possible draining sinus tract extending from the joint to the plantar surface on image 20 series 10. Mild nonspecific marrow edema in the head of the fourth metatarsal and base of the fourth proximal phalanx for example on image 11 series 9, potentially reactive or secondary to early osteomyelitis. Degenerative arthropathy between the head of the first metatarsal and the medial sesamoid associated low-grade marrow edema in both structures. Chronic appearing medial erosion of the head of the first metatarsal possibly from remote gout. Mild hallux valgus. Erosive arthropathy at the interphalangeal joint of the great toe. Ligaments Lisfranc ligament intact. Muscles and Tendons Moderate regional muscular atrophy. Soft tissues Dorsal subcutaneous edema in the forefoot. Plantar ulceration below the medial sesamoid of the first digit. Suspected draining  sinus tract extending from the third MCP joint to the plantar surface. Subcutaneous edema extends into the second through fifth toes and may reflect cellulitis. IMPRESSION: 1. Dorsal dislocation of the proximal phalanx third toe with respect to the metatarsal head. Marrow edema in the third metatarsal head as well as the proximal and middle phalanges of the third toe suspicious for  osteomyelitis particularly given the trace amount of gas just dorsal to the third metatarsal head and suspected draining sinus tract extending from the third MCP joint to the plantar surface. 2. Mild nonspecific marrow edema in the head of the fourth metatarsal and base of the fourth proximal phalanx, potentially reactive or secondary to early osteomyelitis. 3. Plantar ulceration below the medial sesamoid of the first digit. 4. Dorsal subcutaneous edema in the forefoot extending into the second through fifth toes, potentially cellulitis. 5. Degenerative arthropathy between the head of the first metatarsal and the medial sesamoid. 6. Chronic appearing medial erosion of the head of the first metatarsal possibly from remote gout. 7. Erosive arthropathy at the interphalangeal joint of the great toe. Electronically Signed   By: Ryan Salvage M.D.   On: 04/14/2024 08:52   DG Foot Complete Left Result Date: 04/14/2024 CLINICAL DATA:  Diabetic foot wounds.  Evaluate for osteomyelitis. EXAM: LEFT FOOT - COMPLETE 3+ VIEW COMPARISON:  None Available. FINDINGS: No acute bony abnormality. Specifically, no fracture, subluxation, or dislocation. No bone destruction to suggest osteomyelitis. IMPRESSION: No acute bony abnormality. Electronically Signed   By: Franky Crease M.D.   On: 04/14/2024 02:52      Time coordinating discharge: over 30 minutes  SIGNED:  Parris Signer DO [facility]

## 2024-04-17 NOTE — TOC Initial Note (Signed)
 Transition of Care [facility]) - Initial/Assessment Note    Patient Details  Name: Denise Webb MRN: [id number] Date of Birth: 1965-09-08  Transition of Care [facility]) CM/SW Contact:    [place] L Telma Pyeatt, LCSW Phone Number: 04/17/2024, 3:15 PM  Clinical Narrative:                  [facility] consult requesting DME. Order placed for a RW.        Patient Goals and CMS Choice            Expected Discharge Plan and Services                                              Prior Living Arrangements/Services                       Activities of Daily Living   ADL Screening (condition at time of admission) Independently performs ADLs?: Yes (appropriate for developmental age) Is the patient deaf or have difficulty hearing?: No Does the patient have difficulty seeing, even when wearing glasses/contacts?: No Does the patient have difficulty concentrating, remembering, or making decisions?: No  Permission Sought/Granted                  Emotional Assessment              Admission diagnosis:  Hypokalemia [E87.6] Cellulitis of left lower extremity [L03.116] Diabetic infection of left foot (HCC) [Z88.371, L08.9] Patient Active Problem List   Diagnosis Date Noted   Cellulitis of left lower extremity 04/14/2024   Closed intertrochanteric fracture of left femur, initial encounter (HCC) 07/22/2018   Bilateral great toes ulcers (HCC) 10/31/2015   Substance use disorder (abnormal UDS on 09/19/2015) 10/03/2015   Pain medication agreement broken (see 09/19/2015 UDS) 10/03/2015   Chronic lower extremity pain (Location of Primary Source of Pain) (Bilateral) (L>R) 09/19/2015   Chronic lumbar radicular pain (Bilateral) (R>L) (Right L5 Dermatome) 09/19/2015   Osteoarthritis 09/19/2015   Lumbar radiculopathy (Bilateral) (EMG performed on 06/18/2014) 09/19/2015   Nicotine dependence 07/09/2015   Opiate dependence (HCC) 06/25/2015   History of migraine 06/25/2015   Sleep  apnea 06/25/2015   GERD (gastroesophageal reflux disease) 06/25/2015   Hiatal hernia 06/25/2015   Lumbar facet arthropathy (L2-3, L3-4, L4-5, and L5-S1) 06/25/2015   Lumbar disc herniation (L5-S1) 06/25/2015   Cervical disc bulge (Left) (C5-6) 06/25/2015   Chronic neck pain 06/25/2015   Cervical spondylosis 06/25/2015   Lumbar spondylosis 06/25/2015   Chronic pain 06/24/2015   Long term current use of opiate analgesic 06/24/2015   Opiate use (150 MME/Day) 06/24/2015   Chronic low back pain (Location of Tertiary source of pain) (Bilateral) (R>L) 06/24/2015   Lumbar facet syndrome (Bilateral) (R>L) 06/24/2015   Generalized anxiety disorder 10/09/2014   Hyperlipidemia LDL goal <100 05/30/2014   Insomnia 05/30/2014   Depression 01/09/2014   Other and unspecified hyperlipidemia 10/05/2013   DM type 2, uncontrolled, with neuropathy 10/04/2013   Irritable bowel syndrome 03/21/2010   HYPERTRIGLYCERIDEMIA 05/02/2009   ANXIETY DEPRESSION 05/02/2009   Tobacco abuse 05/02/2009   Essential hypertension, benign 05/02/2009   PCP:  Roni, [facility]:   [id number] [address], [place] - [address] [place] [address] Phone: [phone] Fax: [phone]     Social Drivers of Health (SDOH) Social History: SDOH Screenings  Food Insecurity: Food Insecurity Present (04/14/2024)  Housing: Low Risk  (04/14/2024)  Transportation Needs: No Transportation Needs (04/14/2024)  Utilities: Not At Risk (04/14/2024)  Depression (PHQ2-9): Low Risk  (08/15/2018)  Tobacco Use: High Risk (04/13/2024)   SDOH Interventions:     Readmission Risk Interventions     No data to display

## 2024-04-18 LAB — SURGICAL PATHOLOGY

## 2024-04-19 LAB — AEROBIC CULTURE W GRAM STAIN (SUPERFICIAL SPECIMEN): Culture: NO GROWTH

## 2024-04-19 LAB — CULTURE, BLOOD (ROUTINE X 2)
Culture: NO GROWTH
Culture: NO GROWTH

## 2024-04-19 NOTE — Anesthesia Postprocedure Evaluation (Signed)
 Anesthesia Post Note  Patient: Denise Webb  Procedure(s) Performed: AMPUTATION, TOE (Left: Toe)  Patient location during evaluation: PACU Anesthesia Type: General Level of consciousness: awake and alert Pain management: pain level controlled Vital Signs Assessment: post-procedure vital signs reviewed and stable Respiratory status: spontaneous breathing, nonlabored ventilation, respiratory function stable and patient connected to nasal cannula oxygen Cardiovascular status: blood pressure returned to baseline and stable Postop Assessment: no apparent nausea or vomiting Anesthetic complications: no   No notable events documented.   Last Vitals:  Vitals:   04/17/24 0847 04/17/24 1538  BP: 136/88 (!) 139/90  Pulse: 100 94  Resp: 18 16  Temp: 36.7 C 36.7 C  SpO2: 97% 97%    Last Pain:  Vitals:   04/17/24 1558  TempSrc:   PainSc: 6                  [place] KANDICE Clause

## 2024-04-21 ENCOUNTER — Encounter: Payer: Self-pay | Admitting: Podiatry

## 2024-04-21 ENCOUNTER — Ambulatory Visit (INDEPENDENT_AMBULATORY_CARE_PROVIDER_SITE_OTHER)

## 2024-04-21 ENCOUNTER — Ambulatory Visit (INDEPENDENT_AMBULATORY_CARE_PROVIDER_SITE_OTHER): Admitting: Podiatry

## 2024-04-21 VITALS — Ht 68.0 in | Wt 156.0 lb

## 2024-04-21 DIAGNOSIS — M869 Osteomyelitis, unspecified: Secondary | ICD-10-CM

## 2024-04-21 DIAGNOSIS — M86172 Other acute osteomyelitis, left ankle and foot: Secondary | ICD-10-CM

## 2024-04-21 DIAGNOSIS — S98132A Complete traumatic amputation of one left lesser toe, initial encounter: Secondary | ICD-10-CM | POA: Diagnosis not present

## 2024-04-21 LAB — AEROBIC/ANAEROBIC CULTURE W GRAM STAIN (SURGICAL/DEEP WOUND)
Culture: NO GROWTH
Culture: NO GROWTH

## 2024-04-21 NOTE — Progress Notes (Signed)
 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    Subjective:  Patient presents today status post partial third ray amputation left foot.  DOS: 04/16/2024.  Doing well.  No new complaints  Past Medical History:  Diagnosis Date   Abdominal pain, unspecified site 09/19/2013   Acute blood loss anemia 12/2015   Anxiety    Bilateral great toes ulcers (HCC)    Colitis    Depression    Diabetes mellitus without complication (HCC)    type 2    Diabetic peripheral neuropathy associated with type 2 diabetes mellitus (HCC) 06/24/2015   Fracture of right iliac wing (HCC) 01/27/2016   GERD (gastroesophageal reflux disease)    History of head injury 06/25/2015   Hyperlipidemia    Hypertension    IBS (irritable bowel syndrome) 2011-2014   ER visits only    Internal hemorrhoids    Lumbar radicular pain (Bilateral) (R>L) (Right L5 Dermatome) 10/09/2014   On the right leg to pain goes to the top of the foot and big toe following an L5 dermatomal distribution. On the left side it does not go all the way into the foot.    Neuropathy    severe both feet/lower legs   Plantar fasciitis    Sternal fracture 12/2015   TMJ (dislocation of temporomandibular joint)    Trichomoniasis     Past Surgical History:  Procedure Laterality Date   AMPUTATION TOE Left 04/16/2024   Procedure: AMPUTATION, TOE;  Surgeon: Janit Thresa HERO, DPM;  Location: [facility];  Service: Orthopedics/Podiatry;  Laterality: Left;   CESAREAN SECTION  1988   [place]   INTRAMEDULLARY (IM) NAIL INTERTROCHANTERIC Left 07/22/2018   Procedure: INTRAMEDULLARY (IM) NAIL INTERTROCHANTRIC;  Surgeon: Edie Norleen PARAS, MD;  Location: [facility];  Service: Orthopedics;  Laterality: Left;   PELVIC EXENTERATION  1990   Uterus  Frozen (cancer)   TUBAL LIGATION     tubes tied  2004/2005   [place]    Allergies  Allergen Reactions   Elemental Sulfur    Erythromycin Stearate     REACTION: causes anxiety, heart to race   Morphine And Codeine Nausea And Vomiting    Objective/Physical Exam Neurovascular status intact.  Incision well coapted with sutures intact. No sign of infectious process noted. No dehiscence. No active bleeding noted.  Moderate venous congestion noted to the right lower extremity.  Pulses are palpable  Radiographic Exam LT foot 04/21/2024:  Interval amputation with resection of the distal portion of the third metatarsal left foot.  Clean osteotomy.  Aerobic Culture w Gram Stain (superficial specimen) Order: [id number]  Status: Final result     Next appt: None   Test Result Released: Yes (not seen)   Specimen Information: Other Source; Tissue  0 Result Notes    Component Ref Range & Units (hover) 5 d ago  Specimen Description WOUND Performed at [facility], [address] [place], [place] [address]  Special Requests SWAB Performed at [facility], [address] [place], [place] [address]  Gram Stain ABUNDANT WBC PRESENT, PREDOMINANTLY PMN RARE GRAM POSITIVE COCCI  Culture NO GROWTH 2 DAYS Performed at [facility], [address], [place], [place] [address]  Report Status 04/19/2024 FINAL  Resulting Agency [facility]      Assessment: 1. s/p partial third ray amputation left. DOS: 04/16/2024   Plan of Care:  -Patient was evaluated. X-rays reviewed - Dressings changed.  Leave clean dry and intact x 1 week -Continue minimal WBAT postop shoe -Continue Augmentin  875/125 mg until completed -Return to clinic 1 week   Thresa EMERSON Sar, DPM [facility]  Dr. Thresa EMERSON Sar, DPM    [address] [place], [place] [address]                Office [phone]  Fax [phone]

## 2024-04-28 ENCOUNTER — Ambulatory Visit (INDEPENDENT_AMBULATORY_CARE_PROVIDER_SITE_OTHER): Admitting: Podiatry

## 2024-04-28 DIAGNOSIS — M869 Osteomyelitis, unspecified: Secondary | ICD-10-CM

## 2024-04-28 NOTE — Progress Notes (Signed)
 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    Subjective:  Patient presents today status post partial third ray amputation left foot.  DOS: 04/16/2024.  Doing well.  No new complaints  Past Medical History:  Diagnosis Date   Abdominal pain, unspecified site 09/19/2013   Acute blood loss anemia 12/2015   Anxiety    Bilateral great toes ulcers (HCC)    Colitis    Depression    Diabetes mellitus without complication (HCC)    type 2    Diabetic peripheral neuropathy associated with type 2 diabetes mellitus (HCC) 06/24/2015   Fracture of right iliac wing (HCC) 01/27/2016   GERD (gastroesophageal reflux disease)    History of head injury 06/25/2015   Hyperlipidemia    Hypertension    IBS (irritable bowel syndrome) 2011-2014   ER visits only    Internal hemorrhoids    Lumbar radicular pain (Bilateral) (R>L) (Right L5 Dermatome) 10/09/2014   On the right leg to pain goes to the top of the foot and big toe following an L5 dermatomal distribution. On the left side it does not go all the way into the foot.    Neuropathy    severe both feet/lower legs   Plantar fasciitis    Sternal fracture 12/2015   TMJ (dislocation of temporomandibular joint)    Trichomoniasis     Past Surgical History:  Procedure Laterality Date   AMPUTATION TOE Left 04/16/2024   Procedure: AMPUTATION, TOE;  Surgeon: Janit Thresa HERO, DPM;  Location: [facility];  Service: Orthopedics/Podiatry;  Laterality: Left;   CESAREAN SECTION  1988   [place]   INTRAMEDULLARY (IM) NAIL INTERTROCHANTERIC Left 07/22/2018   Procedure: INTRAMEDULLARY (IM) NAIL INTERTROCHANTRIC;  Surgeon: Edie Norleen PARAS, MD;  Location: [facility];  Service: Orthopedics;  Laterality: Left;   PELVIC EXENTERATION  1990   Uterus Frozen (cancer)   TUBAL LIGATION     tubes tied  2004/2005   [place]     Allergies  Allergen Reactions   Elemental Sulfur    Erythromycin Stearate     REACTION: causes anxiety, heart to race   Morphine And Codeine Nausea And Vomiting    Objective/Physical Exam Neurovascular status intact.  Incision well coapted with sutures intact. No sign of infectious process noted. No dehiscence. No active bleeding noted.  Moderate venous congestion noted to the right lower extremity.  Pulses are palpable  Radiographic Exam LT foot 04/21/2024:  Interval amputation with resection of the distal portion of the third metatarsal left foot.  Clean osteotomy.  Aerobic Culture w Gram Stain (superficial specimen) Order: [id number]  Status: Final result     Next appt: None   Test Result Released: Yes (not seen)   Specimen Information: Other Source; Tissue  0 Result Notes    Component Ref Range & Units (hover) 5 d ago  Specimen Description WOUND Performed at [facility], [address] [place], [place] [address]  Special Requests SWAB Performed at [facility], [address] [place], [place] [address]  Gram Stain ABUNDANT WBC PRESENT, PREDOMINANTLY PMN RARE GRAM POSITIVE COCCI  Culture NO GROWTH 2 DAYS Performed at [facility], [address] [place], [place] [address]  Report Status 04/19/2024 FINAL  Resulting Agency [facility]      Assessment: 1. s/p partial  third ray amputation left. DOS: 04/16/2024   Plan of Care:  -Patient was evaluated. X-rays reviewed - Dressings changed.  Leave clean dry and intact x 1 week -Continue minimal WBAT postop shoe - Completed course of Augmentin  875/125 mg -Return to clinic 1 week for suture removal   Thresa EMERSON Sar, DPM [facility]  Dr. Thresa EMERSON Sar, DPM    [address] [place], [place] [address]                Office [phone]  Fax [phone]

## 2024-05-05 ENCOUNTER — Ambulatory Visit (INDEPENDENT_AMBULATORY_CARE_PROVIDER_SITE_OTHER): Admitting: Podiatry

## 2024-05-05 DIAGNOSIS — M869 Osteomyelitis, unspecified: Secondary | ICD-10-CM | POA: Diagnosis not present

## 2024-05-05 NOTE — Progress Notes (Signed)
 Chief Complaint  Patient presents with   Routine Post Op    POV #3 toe amputation    Subjective:  Patient presents today status post partial third ray amputation left foot.  DOS: 04/16/2024.  Doing well.  No new complaints  Past Medical History:  Diagnosis Date   Abdominal pain, unspecified site 09/19/2013   Acute blood loss anemia 12/2015   Anxiety    Bilateral great toes ulcers (HCC)    Colitis    Depression    Diabetes mellitus without complication (HCC)    type 2    Diabetic peripheral neuropathy associated with type 2 diabetes mellitus (HCC) 06/24/2015   Fracture of right iliac wing (HCC) 01/27/2016   GERD (gastroesophageal reflux disease)    History of head injury 06/25/2015   Hyperlipidemia    Hypertension    IBS (irritable bowel syndrome) 2011-2014   ER visits only    Internal hemorrhoids    Lumbar radicular pain (Bilateral) (R>L) (Right L5 Dermatome) 10/09/2014   On the right leg to pain goes to the top of the foot and big toe following an L5 dermatomal distribution. On the left side it does not go all the way into the foot.    Neuropathy    severe both feet/lower legs   Plantar fasciitis    Sternal fracture 12/2015   TMJ (dislocation of temporomandibular joint)    Trichomoniasis     Past Surgical History:  Procedure Laterality Date   AMPUTATION TOE Left 04/16/2024   Procedure: AMPUTATION, TOE;  Surgeon: Janit Thresa HERO, DPM;  Location: [facility];  Service: Orthopedics/Podiatry;  Laterality: Left;   CESAREAN SECTION  1988   [place]   INTRAMEDULLARY (IM) NAIL INTERTROCHANTERIC Left 07/22/2018   Procedure: INTRAMEDULLARY (IM) NAIL INTERTROCHANTRIC;  Surgeon: Edie Norleen PARAS, MD;  Location: [facility];  Service: Orthopedics;  Laterality: Left;   PELVIC EXENTERATION  1990   Uterus Frozen (cancer)   TUBAL LIGATION     tubes tied  2004/2005   [place]    Allergies  Allergen Reactions   Elemental Sulfur    Erythromycin Stearate     REACTION: causes anxiety, heart to  race   Morphine And Codeine Nausea And Vomiting    Objective/Physical Exam Neurovascular status intact.  Incision well coapted with sutures intact. No sign of infectious process noted. No dehiscence. No active bleeding noted.  Moderate venous congestion noted to the right lower extremity.  Pulses are palpable  Radiographic Exam LT foot 04/21/2024:  Interval amputation with resection of the distal portion of the third metatarsal left foot.  Clean osteotomy.  Aerobic Culture w Gram Stain (superficial specimen) Order: [id number]  Status: Final result     Next appt: None   Test Result Released: Yes (not seen)   Specimen Information: Other Source; Tissue  0 Result Notes    Component Ref Range & Units (hover) 5 d ago  Specimen Description WOUND Performed at [facility], [address] [place], [place] [address]  Special Requests SWAB Performed at [facility], [address] [place], [place] [address]  Gram Stain ABUNDANT WBC PRESENT, PREDOMINANTLY PMN RARE GRAM POSITIVE COCCI  Culture NO GROWTH 2 DAYS Performed at [facility], [address], [place], [place] [address]  Report Status 04/19/2024 FINAL  Resulting Agency [facility]      Assessment: 1. s/p partial third ray amputation left. DOS: 04/16/2024   Plan of Care:  -Patient was evaluated.  -Sutures removed -The incision  site on the plantar wound has healed.  Continue good supportive tennis shoes and sneakers -Return to clinic PRN   Thresa EMERSON Sar, DPM [facility]  Dr. Thresa EMERSON Sar, DPM    [address] [place], [place] [address]                Office [phone]  Fax [phone]

## 2024-09-11 ENCOUNTER — Encounter: Admitting: Physician Assistant

## 2024-11-07 ENCOUNTER — Encounter: Admitting: Physician Assistant
